# Patient Record
Sex: Female | Born: 1980 | Race: White | Hispanic: No | Smoking: Never smoker
Health system: Southern US, Community
[De-identification: ages and names within clinical notes are randomized; demographics above are authoritative.]

## PROBLEM LIST (undated history)

## (undated) ENCOUNTER — Inpatient Hospital Stay (HOSPITAL_COMMUNITY): Payer: 59

## (undated) DIAGNOSIS — Z8719 Personal history of other diseases of the digestive system: Secondary | ICD-10-CM

## (undated) DIAGNOSIS — F329 Major depressive disorder, single episode, unspecified: Secondary | ICD-10-CM

## (undated) DIAGNOSIS — G43909 Migraine, unspecified, not intractable, without status migrainosus: Secondary | ICD-10-CM

## (undated) DIAGNOSIS — M797 Fibromyalgia: Secondary | ICD-10-CM

## (undated) DIAGNOSIS — M549 Dorsalgia, unspecified: Secondary | ICD-10-CM

## (undated) DIAGNOSIS — F32A Depression, unspecified: Secondary | ICD-10-CM

## (undated) DIAGNOSIS — K219 Gastro-esophageal reflux disease without esophagitis: Secondary | ICD-10-CM

## (undated) DIAGNOSIS — F419 Anxiety disorder, unspecified: Secondary | ICD-10-CM

## (undated) HISTORY — DX: Depression, unspecified: F32.A

## (undated) HISTORY — DX: Anxiety disorder, unspecified: F41.9

## (undated) HISTORY — DX: Migraine, unspecified, not intractable, without status migrainosus: G43.909

## (undated) HISTORY — DX: Dorsalgia, unspecified: M54.9

## (undated) HISTORY — DX: Major depressive disorder, single episode, unspecified: F32.9

## (undated) HISTORY — DX: Gastro-esophageal reflux disease without esophagitis: K21.9

## (undated) HISTORY — DX: Fibromyalgia: M79.7

## (undated) HISTORY — DX: Personal history of other diseases of the digestive system: Z87.19

---

## 2004-12-20 HISTORY — PX: CHOLECYSTECTOMY: SHX55

## 2006-11-27 ENCOUNTER — Emergency Department (HOSPITAL_COMMUNITY): Admission: EM | Admit: 2006-11-27 | Discharge: 2006-11-27 | Payer: Self-pay | Admitting: Emergency Medicine

## 2007-08-18 ENCOUNTER — Ambulatory Visit (HOSPITAL_COMMUNITY): Admission: RE | Admit: 2007-08-18 | Discharge: 2007-08-18 | Payer: Self-pay | Admitting: Family Medicine

## 2011-11-18 ENCOUNTER — Other Ambulatory Visit: Payer: Self-pay | Admitting: Chiropractic Medicine

## 2011-11-18 DIAGNOSIS — M545 Low back pain, unspecified: Secondary | ICD-10-CM

## 2011-11-22 ENCOUNTER — Ambulatory Visit
Admission: RE | Admit: 2011-11-22 | Discharge: 2011-11-22 | Disposition: A | Discharge status: Home / Self Care | Payer: 59 | Source: Ambulatory Visit | Attending: Chiropractic Medicine | Admitting: Chiropractic Medicine

## 2011-11-22 DIAGNOSIS — M545 Low back pain, unspecified: Secondary | ICD-10-CM

## 2012-06-17 ENCOUNTER — Other Ambulatory Visit: Payer: Self-pay

## 2012-06-17 MED ORDER — TOPIRAMATE 100 MG PO TABS: 100.0000 mg | ORAL_TABLET | ORAL | Status: DC

## 2012-06-17 MED ORDER — ALMOTRIPTAN MALATE 12.5 MG PO TABS: 12.5000 mg | ORAL_TABLET | ORAL | Status: DC | PRN

## 2012-06-17 MED ORDER — PROMETHAZINE HCL 25 MG PO TABS: 25.0000 mg | ORAL_TABLET | ORAL | Status: DC | PRN

## 2012-06-17 MED ORDER — NAPROXEN 500 MG PO TABS: 500.0000 mg | ORAL_TABLET | ORAL | Status: DC | PRN

## 2012-07-13 ENCOUNTER — Ambulatory Visit: Payer: Self-pay | Admitting: Neurology

## 2012-07-15 ENCOUNTER — Other Ambulatory Visit: Payer: Self-pay

## 2012-07-15 MED ORDER — ALMOTRIPTAN MALATE 12.5 MG PO TABS: 12.5000 mg | ORAL_TABLET | ORAL | Status: DC | PRN

## 2012-07-27 ENCOUNTER — Encounter: Payer: Self-pay | Admitting: Nurse Practitioner

## 2012-07-27 ENCOUNTER — Ambulatory Visit (INDEPENDENT_AMBULATORY_CARE_PROVIDER_SITE_OTHER): Payer: 59 | Admitting: Nurse Practitioner

## 2012-07-27 VITALS — BP 115/78 | HR 79 | Ht 67.0 in | Wt 188.0 lb

## 2012-07-27 DIAGNOSIS — F3289 Other specified depressive episodes: Secondary | ICD-10-CM

## 2012-07-27 DIAGNOSIS — F329 Major depressive disorder, single episode, unspecified: Secondary | ICD-10-CM

## 2012-07-27 DIAGNOSIS — G43009 Migraine without aura, not intractable, without status migrainosus: Secondary | ICD-10-CM | POA: Insufficient documentation

## 2012-07-27 DIAGNOSIS — F32A Depression, unspecified: Secondary | ICD-10-CM | POA: Insufficient documentation

## 2012-07-27 DIAGNOSIS — F411 Generalized anxiety disorder: Secondary | ICD-10-CM

## 2012-07-27 MED ORDER — TOPIRAMATE 100 MG PO TABS: 100.0000 mg | ORAL_TABLET | Freq: Two times a day (BID) | ORAL | Status: DC

## 2012-07-27 MED ORDER — ALMOTRIPTAN MALATE 12.5 MG PO TABS: 12.5000 mg | ORAL_TABLET | ORAL | Status: DC | PRN

## 2012-07-27 MED ORDER — NAPROXEN 500 MG PO TABS: 500.0000 mg | ORAL_TABLET | ORAL | Status: DC | PRN

## 2012-07-27 MED ORDER — MAGNESIUM OXIDE 400 MG PO TABS: 400.0000 mg | ORAL_TABLET | Freq: Two times a day (BID) | ORAL | Status: DC

## 2012-07-27 MED ORDER — PROMETHAZINE HCL 25 MG PO TABS: 25.0000 mg | ORAL_TABLET | ORAL | Status: DC | PRN

## 2012-07-27 MED ORDER — RIBOFLAVIN 100 MG PO TABS: 1.0000 | ORAL_TABLET | Freq: Two times a day (BID) | ORAL | Status: DC

## 2012-07-27 NOTE — Progress Notes (Signed)
[facility]  PATIENT: Alexandra Warren DOB: [birth year]   HISTORY FROM: patient, chart REASON FOR VISIT: routine follow up for Migraines   HISTORICAL  CHIEF COMPLAINT:  Chief Complaint  Patient presents with  . Headache    Rv in room 9    HISTORY OF PRESENT ILLNESS: [name] is a 32 year-old left-handed Caucasian female referred by her primary care physician for evaluation of migraine. She has history of migraines since 2008, approximately age 74 or 74, typical migraine is right parietal retro-orbital area with severe achy pain, sometimes was associated with light sensitivity and noise sensitivity, lasting a couple hours or until able to go to sleep. Responded very well to Axert. Triggers for her migraines are stress, hormonal change, weather change, and she has increased migraines in the summer, she has 20-30 migraines days each month, responding to Axert 75% of the time. A quarter of the time even though she was taking repeated those of Axert, she would suffer prolonged severe protracted migraine that can last a couple of days. She has tried Imitrex in the past which caused neck tightness. She has been taking Topamax 100 mg every morning, which has been very helpful, previously tried beta blockers and amitriptyline without help. She also has a history of depression and generalized anxiety taking Cymbalta 120 mg every day. Her father had history of migraine.  She was under the care of local neurologist Dr. Lalla Brothers who since has left the practice. MRI of the brain was normal 2009.  UPDATE 07/27/2012: Patient returns for followup visit since last visit 06/11/2011. She reports that Topamax has helped the frequency of her headaches with no adverse side effects. She still takes Axert 1 or 2 doses which usually relieves her headache within one to 2 hours, and occasionally Aleve.  She is currently also taking Celebrex for low back pain. 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 Migraine sometimes occur with nausea, no vomiting. Patient states there's no aura or vision changes. She states that she is now averaging 6-7 migraines per month, and is happy with treatment but would agree to try something else if it would help have even less headaches.  She states that anxiety has always been a large problem for her which leads to depression.  She sees Dr. Valinda Hoar on a regular basis, and has been treated with Zoloft which caused restless legs, and now presently Cymbalta.  REVIEW OF SYSTEMS: Full 14 system review of systems performed and notable only for:  Constitutional: N/A  Cardiovascular: N/A  Ear/Nose/Throat: N/A  Skin: N/A  Eyes: N/A  Respiratory: N/A  Gastroitestinal: N/A  Hematology/Lymphatic: N/A  Endocrine: N/A Musculoskeletal: Joint pain, aching muscles  Allergy/Immunology: N/A  Neurological: Headache Psychiatric: Depression, anxiety, too much sleep, not enough sleep   ALLERGIES: No Known Allergies  HOME MEDICATIONS: Outpatient Prescriptions Prior to Visit  Medication Sig Dispense Refill  . naproxen (NAPROSYN) 500 MG tablet Take 1 tablet (500 mg total) by mouth as needed (for migraine).  30 tablet  0  . promethazine (PHENERGAN) 25 MG tablet Take 1 tablet (25 mg total) by mouth as needed for nausea.  30 tablet  0  . almotriptan (AXERT) 12.5 MG tablet Take 1 tablet (12.5 mg total) by mouth as needed for migraine.  8 tablet  0  . topiramate (TOPAMAX) 100 MG tablet Take 1 tablet (100  mg total) by mouth every morning.  30 tablet  0   . CELEBREX 200 MG capsule    Sig: Take 1 capsule by mouth daily.  Marland Kitchen LORazepam (ATIVAN) 1 MG tablet    Sig: Take 1 tablet by mouth daily.  . DULoxetine (CYMBALTA) 60 MG capsule    Sig: Take 60 mg by mouth 2 (two) times daily.  Marland Kitchen zolpidem (AMBIEN) 10 MG tablet    Sig: Take 10 mg by mouth at bedtime as needed  for sleep.  Marland Kitchen omeprazole (PRILOSEC) 10 MG capsule    Sig: Take 10 mg by mouth daily.  . traMADol (ULTRAM) 50 MG tablet    Sig: Take 50 mg by mouth 4 (four) times daily as needed for pain.   PAST MEDICAL HISTORY: Past Medical History  Diagnosis Date  . Migraine   . Anxiety and depression   . Back pain   . Acid reflux     PAST SURGICAL HISTORY: Past Surgical History  Procedure Laterality Date  . Cholecystectomy  12/2004    FAMILY HISTORY: History reviewed. No pertinent family history.  SOCIAL HISTORY: History   Social History  . Marital Status: Married    Spouse Name: N/A    Number of Children: N/A  . Years of Education: N/A   Occupational History  .  [employer]   Social History Main Topics  . Smoking status: Never Smoker   . Smokeless tobacco: Never Used  . Alcohol Use: No  . Drug Use: No  . Sexually Active: Not on file   Other Topics Concern  . Not on file   Social History Narrative   Patient lives at home with her spouse.    Caffeine Use:      PHYSICAL EXAM  Filed Vitals:   07/27/12 1043  BP: 115/78  Pulse: 79  Height: 5\' 7"  (1.702 m)  Weight: 188 lb (85.276 kg)   Body mass index is 29.44 kg/(m^2).  Generalized: In no acute distress, anxious  Neck: Supple, no carotid bruits   Cardiac: Regular rate rhythm, no murmur   Pulmonary: Clear to auscultation bilaterally   Musculoskeletal: No deformity   Neurological examination   Mentation: Alert oriented to time, place, history taking, language fluent, and causual conversation  Cranial nerve II-XII: Pupils were equal round reactive to light extraocular movements were full, visual field were full on confrontational test. facial sensation and strength were normal. hearing was intact to finger rubbing bilaterally. Uvula tongue midline. head turning and shoulder shrug and were normal and symmetric.Tongue protrusion into cheek strength was normal. MOTOR: normal bulk and tone, full strength in the  BUE, BLE, fine finger movements normal, no pronator drift SENSORY: normal and symmetric to light touch, pinprick, temperature, vibration and proprioception COORDINATION: finger-nose-finger, heel-to-shin bilaterally, there was no truncal ataxia REFLEXES: Brachioradialis 2/2, biceps 2/2, triceps 2/2, patellar 2/2, Achilles 2/2, plantar responses were flexor bilaterally. GAIT/STATION: Rising up from seated position without assistance, normal stance, without trunk ataxia, moderate stride, good arm swing, smooth turning, able to perform tiptoe, and heel walking without difficulty.   DIAGNOSTIC DATA (LABS, IMAGING, TESTING) - I reviewed patient records, labs, notes, testing and imaging myself where available.   ASSESSMENT AND PLAN  31 y.o. year old female  has a past medical history of Migraine; Anxiety and depression; Back pain; and Acid reflux. here with migraine without aura.  PLAN: 1. Increase Topamax 100mg  to BID. #60, Refills 11. 2. Start Riboflavin 100 mg BID and Magnesium Oxide 400 mg  BID. 3. Continue Axert 12.5 mg for acute headaches. #15, Refill 11 4. May continue Naproxen 500mg  , Phenergen 25 mg as needed for prolonged severe migraine headaches. 5. Please keep a diary of your headaches the month before you return and bring to next visit.  Document  each occurrence on the calendar with notation of : #1 any prodrome ( any non headache symptom such as marked fatigue,visual changes, ,etc ) which precedes actual headache ; #2) severity on 1-10 scale; #3) any triggers ( food/ drink,enviromenntal or weather changes ,physical or emotional stress) in 8-12 hour period prior to the headache; & #4) response to any medications or other intervention.  (Handout given). 6. Follow up in 6 months, sooner if needed.   Meds ordered this encounter  Medications  . topiramate (TOPAMAX) 100 MG tablet    Sig: Take 1 tablet (100 mg total) by mouth 2 (two) times daily.    Dispense:  60 tablet    Refill:  11     Order Specific Question:  Supervising Provider    Answer:  Levert Feinstein [[id number]]  . almotriptan (AXERT) 12.5 MG tablet    Sig: Take 1 tablet (12.5 mg total) by mouth as needed for migraine.    Dispense:  15 tablet    Refill:  11    Order Specific Question:  Supervising Provider    Answer:  Levert Feinstein [[id number]]  . Riboflavin 100 MG TABS    Sig: Take 1 tablet (100 mg total) by mouth 2 (two) times daily.    Refill:  0    Order Specific Question:  Supervising Provider    Answer:  Levert Feinstein [[id number]]  . magnesium oxide (MAG-OX) 400 MG tablet    Sig: Take 1 tablet (400 mg total) by mouth 2 (two) times daily.    Order Specific Question:  Supervising Provider    Answer:  Levert Feinstein [[id number]]     LYNN LAM NP-C 07/27/2012, 11:39 AM  [facility] [address], [address] [place], [place] [address] [phone]

## 2012-07-27 NOTE — Patient Instructions (Signed)
Increase Topamax to 100mg  twice a day.  Continue Axert for acute headache.  May take Naproxen for headache, try not to take with Celebrex.  Start Riboflavin (otc) 100mg  twice a day and  Magnesium Oxide 400mg  twice a day.  Return in 6 months.

## 2013-01-28 ENCOUNTER — Encounter: Payer: Self-pay | Admitting: Nurse Practitioner

## 2013-01-28 ENCOUNTER — Encounter (INDEPENDENT_AMBULATORY_CARE_PROVIDER_SITE_OTHER): Payer: Self-pay

## 2013-01-28 ENCOUNTER — Ambulatory Visit (INDEPENDENT_AMBULATORY_CARE_PROVIDER_SITE_OTHER): Payer: 59 | Admitting: Nurse Practitioner

## 2013-01-28 VITALS — BP 117/82 | HR 97 | Temp 98.7°F | Ht 66.0 in | Wt 196.5 lb

## 2013-01-28 DIAGNOSIS — F411 Generalized anxiety disorder: Secondary | ICD-10-CM

## 2013-01-28 DIAGNOSIS — G43009 Migraine without aura, not intractable, without status migrainosus: Secondary | ICD-10-CM

## 2013-01-28 MED ORDER — ONDANSETRON HCL 4 MG PO TABS: 4.0000 mg | ORAL_TABLET | Freq: Three times a day (TID) | ORAL | Status: DC | PRN

## 2013-01-28 NOTE — Progress Notes (Signed)
PATIENT: CATISHA CURCURU DOB: 1980-04-15   REASON FOR VISIT: follow up HISTORY FROM: patient  HISTORY OF PRESENT ILLNESS: Mrs. Meryssa Brander is a 33 year-old left-handed Caucasian female referred by her primary care physician for evaluation of migraine. She has history of migraines since 2008, approximately age 5 or 8, typical migraine is right parietal retro-orbital area with severe achy pain, sometimes was associated with light sensitivity and noise sensitivity, lasting a couple hours or until able to go to sleep. Responded very well to Axert. Triggers for her migraines are stress, hormonal change, weather change, and she has increased migraines in the summer, she has 20-30 migraines days each month, responding to Axert 75% of the time. A quarter of the time even though she was taking repeated those of Axert, she would suffer prolonged severe protracted migraine that can last a couple of days. She has tried Imitrex in the past which caused neck tightness. She has been taking Topamax 100 mg every morning, which has been very helpful, previously tried beta blockers and amitriptyline without help. She also has a history of depression and generalized anxiety taking Cymbalta 120 mg every day. Her father had history of migraine. She was under the care of local neurologist Dr. Silvio Pate who since has left the practice. MRI of the brain was normal 2009.   UPDATE 07/27/2012 (LL): Patient returns for followup visit since last visit 06/11/2011. She reports that Topamax has helped the frequency of her headaches with no adverse side effects. She still takes Axert 1 or 2 doses which usually relieves her headache within one to 2 hours, and occasionally Aleve. She is currently also taking Celebrex for low back pain. 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 Migraine sometimes occur with nausea, no vomiting.  Patient states there's no aura or vision changes. She states that she is now averaging 6-7 migraines per month, and is happy with treatment but would agree to try something else if it would help have even less headaches. She states that anxiety has always been a large problem for her which leads to depression. She sees Dr. Gala Murdoch on a regular basis, and has been treated with Zoloft which caused restless legs, and now presently Cymbalta.   UPDATE 01/28/13 (LL):  Mrs. Laurance Flatten returns for revisit.  She reports headaches were more frequent around the holidays and that she averages 10 Migraine days per month even with Topamax and Riboflavin/Magnesium.  Axert is effective.  She is happy with current treatment, except would like something other than Phenergan for nausea because it gives her restless legs.  REVIEW OF SYSTEMS: Full 14 system review of systems performed and notable only for: Insomnia, Back pain, Headache, Depression, Anxiety.  ALLERGIES: No Known Allergies  HOME MEDICATIONS: Outpatient Prescriptions Prior to Visit  Medication Sig Dispense Refill  . almotriptan (AXERT) 12.5 MG tablet Take 1 tablet (12.5 mg total) by mouth as needed for migraine.  15 tablet  11  . CELEBREX 200 MG capsule Take 1 capsule by mouth daily.      . DULoxetine (CYMBALTA) 60 MG capsule Take 60 mg by mouth 2 (two) times daily.      Marland Kitchen LORazepam (ATIVAN) 1 MG tablet Take 1 tablet by mouth daily.      . magnesium oxide (MAG-OX) 400 MG tablet Take 1 tablet (400 mg total) by mouth 2 (two) times daily.      Marland Kitchen  naproxen (NAPROSYN) 500 MG tablet Take 1 tablet (500 mg total) by mouth as needed (for migraine).  30 tablet  3  . omeprazole (PRILOSEC) 10 MG capsule Take 10 mg by mouth daily.      . Riboflavin 100 MG TABS Take 1 tablet (100 mg total) by mouth 2 (two) times daily.    0  . topiramate (TOPAMAX) 100 MG tablet Take 1 tablet (100 mg total) by mouth 2 (two) times daily.  60 tablet  11  . traMADol (ULTRAM) 50 MG tablet  Take 50 mg by mouth 4 (four) times daily as needed for pain.      Marland Kitchen zolpidem (AMBIEN) 10 MG tablet Take 10 mg by mouth at bedtime as needed for sleep.      . promethazine (PHENERGAN) 25 MG tablet Take 1 tablet (25 mg total) by mouth as needed for nausea.  30 tablet  0   No facility-administered medications prior to visit.    PAST MEDICAL HISTORY: Past Medical History  Diagnosis Date  . Migraine   . Anxiety and depression     Dr. Gala Murdoch, MD  . Back pain   . Acid reflux     PAST SURGICAL HISTORY: Past Surgical History  Procedure Laterality Date  . Cholecystectomy  12/2004    FAMILY HISTORY: History reviewed. No pertinent family history.  SOCIAL HISTORY: History   Social History  . Marital Status: Married    Spouse Name: Corene Cornea    Number of Children: 0  . Years of Education: MS   Occupational History  .  [employer]  . [employer]   Social History Main Topics  . Smoking status: Never Smoker   . Smokeless tobacco: Never Used  . Alcohol Use: No  . Drug Use: No  . Sexual Activity: Not on file   Other Topics Concern  . Not on file   Social History Narrative   Patient lives at home with her spouse.    Caffeine Use: 1-2 sodas daily   PHYSICAL EXAM  Filed Vitals:   01/28/13 1521  BP: 117/82  Pulse: 97  Temp: 98.7 F (37.1 C)  TempSrc: Oral  Height: 5\' 6"  (1.676 m)  Weight: 196 lb 8 oz (89.132 kg)   Body mass index is 31.73 kg/(m^2).  Generalized: Well developed, in no acute distress  Head: normocephalic and atraumatic. Oropharynx benign  Neck: Supple, no carotid bruits  Cardiac: Regular rate rhythm, no murmur  Musculoskeletal: No deformity   Neurological examination  Mentation: Alert oriented to time, place, history taking. Follows all commands speech and language fluent Cranial nerve II-XII: Fundoscopic exam reveals sharp disc margins.Pupils were equal round reactive to light extraocular movements were  full, visual field were full on confrontational test. Facial sensation and strength were normal. hearing was intact to finger rubbing bilaterally. Uvula tongue midline. head turning and shoulder shrug and were normal and symmetric.Tongue protrusion into cheek strength was normal. Motor: normal bulk and tone, full strength in the BUE, BLE, fine finger movements normal, no pronator drift. No focal weakness Sensory: normal and symmetric to light touch, pinprick, and  vibration  Coordination: finger-nose-finger, heel-to-shin bilaterally, no dysmetria Reflexes:  Deep tendon reflexes in the upper and lower extremities are present and symmetric.  Gait and Station: Rising up from seated position without assistance, normal stance, without trunk ataxia, moderate stride, good arm swing, smooth turning, able to perform tiptoe, and heel walking without difficulty.    ASSESSMENT AND PLAN  33 y.o. year old female has a past medical history of Migraine; Anxiety and depression; Back pain; and Acid reflux. here with migraine without aura.  Her migraines are slightly improved, still having at least 10 Migraine days per month.  PLAN:  1. Continue Topamax and Riboflavin, Magnesium for Migraine prevention.  Stressed contraception while on Topamax. 2. Continue Axrt as needed for acute Migraine.  Try samples of Relpax and Cambia for acute Migraine.  If you would like a prescription for those, please call. 3. Zofran q 8 hours as needed for nausea. 4. Please keep a diary of your headaches the month before you return and bring to next visit. Document each occurrence on the calendar with notation of : #1 any prodrome ( any non headache symptom such as marked fatigue,visual changes, ,etc ) which precedes actual headache ; #2) severity on 1-10 scale; #3) any triggers ( food/ drink,enviromenntal or weather changes ,physical or emotional stress) in 8-12 hour period prior to the headache; & #4) response to any medications or other  intervention. (Handout given).  5. Follow up in 6 months, sooner if needed, to see Dr. Krista Blue next time.  Meds ordered this encounter  Medications  . ondansetron (ZOFRAN) 4 MG tablet    Sig: Take 1 tablet (4 mg total) by mouth every 8 (eight) hours as needed for nausea or vomiting.    Dispense:  20 tablet    Refill:  5    Order Specific Question:  Supervising Provider    Answer:  Marcial Pacas [[id number]]   Return in about 6 months (around 07/28/2013).  Philmore Pali, MSN, NP-C 01/28/2013, 4:03 PM [facility] [address], [place], [place] [address] [phone]  Note: This document was prepared with digital dictation and possible smart phrase technology. Any transcriptional errors that result from this process are unintentional.

## 2013-01-28 NOTE — Patient Instructions (Addendum)
Continue Topamax and Riboflavin, Magnesium for Migraine prevention.  Continue Axert as needed for acute Migraine.  Try samples of Relpax and Cambia for acute Migraine.  If you would like a prescription for those, please call.  Zofran 4 mg was sent to the pharmacy for nausea.  Please keep a diary of your headaches the month before you return and bring to next visit. Document each occurrence on the calendar with notation of : #1 any prodrome ( any non headache symptom such as marked fatigue,visual changes, ,etc ) which precedes actual headache ; #2) severity on 1-10 scale; #3) any triggers ( food/ drink,enviromenntal or weather changes ,physical or emotional stress) in 8-12 hour period prior to the headache; & #4) response to any medications or other intervention. (Handout given).  6. Follow up in 6 months, sooner if needed with Dr. Krista Blue.

## 2013-07-28 ENCOUNTER — Ambulatory Visit: Payer: 59 | Admitting: Neurology

## 2013-08-02 ENCOUNTER — Encounter: Payer: Self-pay | Admitting: Neurology

## 2013-08-02 ENCOUNTER — Ambulatory Visit (INDEPENDENT_AMBULATORY_CARE_PROVIDER_SITE_OTHER): Payer: 59 | Admitting: Neurology

## 2013-08-02 VITALS — BP 115/78 | HR 128 | Ht 65.0 in | Wt 192.0 lb

## 2013-08-02 DIAGNOSIS — G4700 Insomnia, unspecified: Secondary | ICD-10-CM

## 2013-08-02 MED ORDER — TOPIRAMATE 100 MG PO TABS: 100.0000 mg | ORAL_TABLET | Freq: Two times a day (BID) | ORAL | Status: DC

## 2013-08-02 MED ORDER — ALMOTRIPTAN MALATE 12.5 MG PO TABS: 12.5000 mg | ORAL_TABLET | ORAL | Status: DC | PRN

## 2013-08-02 NOTE — Progress Notes (Signed)
PATIENT: Alexandra Warren DOB: [birth year]  HISTORY OF PRESENT ILLNESS: Alexandra Warren is a 33 year-old left-handed Caucasian female referred by her primary care physician PA Delman Cheadle for evaluation of migraine.   She has history of migraines since 2008, approximately age 89 or 60, typical migraine is right parietal retro-orbital area with severe achy pain, sometimes was associated with light sensitivity and noise sensitivity, lasting a couple hours or until able to go to sleep. Responded very well to Axert. Triggers for her migraines are stress, hormonal change, weather change.  She can have up to 20-30 migraines days each month, responding to Axert 75% of the time. A quarter of the time even though she was taking repeated those of Axert, she would suffer prolonged severe protracted migraine that can last a couple of days. She has tried Imitrex in the past which caused neck tightness.   She has been taking Topamax 100 mg every morning, which has been very helpful, previously tried beta blockers and amitriptyline without help.   She also has a history of depression and generalized anxiety taking Cymbalta 120 mg every day. Her father had history of migraine. She was previous under the care of local neurologist Dr. Silvio Pate who since has left the practice.   MRI of the brain was normal 2009.    She is now taking Topamax 100mg  bid and Riboflavin/Magnesium.  Axert is effective   UPDATE July 14th 2015: She only had 2 migraines in past 1 month, now she is less stressful, while she is on summer medication, Axert continues to help, she also complains of excessive fatigue, sleepiness during the daytime, ESS score is 14, FSS is 59,  She denies snoring, has lost few pounds. But she has excessive daytime sleepiness, and fatigue.  REVIEW OF SYSTEMS: Full 14 system review of systems performed and notable only for: Insomnia, Back pain, Headache, Depression, Anxiety.  ALLERGIES: No Known  Allergies  HOME MEDICATIONS: Outpatient Prescriptions Prior to Visit  Medication Sig Dispense Refill  . almotriptan (AXERT) 12.5 MG tablet Take 1 tablet (12.5 mg total) by mouth as needed for migraine.  15 tablet  11  . CELEBREX 200 MG capsule Take 1 capsule by mouth daily.      . DULoxetine (CYMBALTA) 60 MG capsule Take 60 mg by mouth 2 (two) times daily.      Marland Kitchen LORazepam (ATIVAN) 1 MG tablet Take 1 tablet by mouth daily.      . magnesium oxide (MAG-OX) 400 MG tablet Take 1 tablet (400 mg total) by mouth 2 (two) times daily.      . naproxen (NAPROSYN) 500 MG tablet Take 1 tablet (500 mg total) by mouth as needed (for migraine).  30 tablet  3  . omeprazole (PRILOSEC) 10 MG capsule Take 10 mg by mouth daily.      . ondansetron (ZOFRAN) 4 MG tablet Take 1 tablet (4 mg total) by mouth every 8 (eight) hours as needed for nausea or vomiting.  20 tablet  5  . Riboflavin 100 MG TABS Take 1 tablet (100 mg total) by mouth 2 (two) times daily.    0  . topiramate (TOPAMAX) 100 MG tablet Take 1 tablet (100 mg total) by mouth 2 (two) times daily.  60 tablet  11  . traMADol (ULTRAM) 50 MG tablet Take 50 mg by mouth 4 (four) times daily as needed for pain.      Marland Kitchen zolpidem (AMBIEN) 10 MG tablet Take 5 mg by mouth  at bedtime as needed for sleep.        No facility-administered medications prior to visit.    PAST MEDICAL HISTORY: Past Medical History  Diagnosis Date  . Migraine   . Anxiety and depression     Dr. Gala Murdoch, MD  . Back pain   . Acid reflux     PAST SURGICAL HISTORY: Past Surgical History  Procedure Laterality Date  . Cholecystectomy  12/2004    FAMILY HISTORY: History reviewed. No pertinent family history.  SOCIAL HISTORY: History   Social History  . Marital Status: Married    Spouse Name: Corene Cornea    Number of Children: 0  . Years of Education: MS   Occupational History  .  [employer]  . [employer]   Social History Main  Topics  . Smoking status: Never Smoker   . Smokeless tobacco: Never Used  . Alcohol Use: No  . Drug Use: No  . Sexual Activity: Not on file   Other Topics Concern  . Not on file   Social History Narrative   Patient lives at home with her spouse.    Caffeine Use: 1-2 sodas daily   PHYSICAL EXAM  Filed Vitals:   08/02/13 1314  BP: 115/78  Pulse: 128  Height: 5\' 5"  (1.651 m)  Weight: 192 lb (87.091 kg)   Body mass index is 31.95 kg/(m^2).  Generalized: Well developed, in no acute distress  Head: normocephalic and atraumatic. Oropharynx benign  Neck: Supple, no carotid bruits  Cardiac: Regular rate rhythm, no murmur  Musculoskeletal: No deformity   Neurological examination  Mentation: Alert oriented to time, place, history taking. Follows all commands speech and language fluent Cranial nerve II-XII: Fundoscopic exam reveals sharp disc margins.Pupils were equal round reactive to light extraocular movements were full, visual field were full on confrontational test. Facial sensation and strength were normal. hearing was intact to finger rubbing bilaterally. Uvula tongue midline. head turning and shoulder shrug and were normal and symmetric.Tongue protrusion into cheek strength was normal. Motor: normal bulk and tone, full strength in the BUE, BLE, fine finger movements normal, no pronator drift. No focal weakness Sensory: normal and symmetric to light touch, pinprick, and  vibration  Coordination: finger-nose-finger, heel-to-shin bilaterally, no dysmetria Reflexes:  Deep tendon reflexes in the upper and lower extremities are present and symmetric.  Gait and Station: Rising up from seated position without assistance, normal stance, without trunk ataxia, moderate stride, good arm swing, smooth turning, able to perform tiptoe, and heel walking without difficulty.    ASSESSMENT AND PLAN 33 y.o. year old female has a past medical history of Migraine; Anxiety and depression; Back pain;  and Acid reflux. here with migraine without aura.  Her migraines are slightly improved, still having at least 10 Migraine days per month.  PLAN:  1. Continue Topamax and Riboflavin, Magnesium for Migraine prevention.   2. Axert as needed. 3. Sleep refer  Marcial Pacas, M.D   [facility] [address], [place], [place] [address] [phone]

## 2013-08-03 ENCOUNTER — Telehealth: Payer: Self-pay | Admitting: Neurology

## 2013-08-03 DIAGNOSIS — E669 Obesity, unspecified: Secondary | ICD-10-CM

## 2013-08-03 DIAGNOSIS — R519 Headache, unspecified: Secondary | ICD-10-CM

## 2013-08-03 DIAGNOSIS — R400 Somnolence: Secondary | ICD-10-CM

## 2013-08-03 DIAGNOSIS — R51 Headache: Secondary | ICD-10-CM

## 2013-08-03 NOTE — Telephone Encounter (Signed)
.   Dr. Marcial Pacas is referring Alexandra Warren, 33 y.o. y/o female, for an attended sleep study.  Wt: 192 lbs Ht: 65 in BMI: 31.95  Diagnoses: Migraine Excessive Daytime Sleepiness Fatigue Insomnia Obesity  Medication List: Current Outpatient Prescriptions  Medication Sig Dispense Refill  . almotriptan (AXERT) 12.5 MG tablet Take 1 tablet (12.5 mg total) by mouth as needed for migraine.  15 tablet  11  . CELEBREX 200 MG capsule Take 1 capsule by mouth daily.      . DULoxetine (CYMBALTA) 60 MG capsule Take 60 mg by mouth 2 (two) times daily.      Marland Kitchen gabapentin (NEURONTIN) 300 MG capsule Take 300 capsules by mouth 2 (two) times daily.      Marland Kitchen LORazepam (ATIVAN) 1 MG tablet Take 1 tablet by mouth daily.      . magnesium oxide (MAG-OX) 400 MG tablet Take 1 tablet (400 mg total) by mouth 2 (two) times daily.      . naproxen (NAPROSYN) 500 MG tablet Take 1 tablet (500 mg total) by mouth as needed (for migraine).  30 tablet  3  . omeprazole (PRILOSEC) 10 MG capsule Take 10 mg by mouth daily.      . ondansetron (ZOFRAN) 4 MG tablet Take 1 tablet (4 mg total) by mouth every 8 (eight) hours as needed for nausea or vomiting.  20 tablet  5  . Riboflavin 100 MG TABS Take 1 tablet (100 mg total) by mouth 2 (two) times daily.    0  . topiramate (TOPAMAX) 100 MG tablet Take 1 tablet (100 mg total) by mouth 2 (two) times daily.  60 tablet  11  . traMADol (ULTRAM) 50 MG tablet Take 50 mg by mouth 4 (four) times daily as needed for pain.      . traZODone (DESYREL) 50 MG tablet Take 50 mg by mouth daily.      Marland Kitchen zolpidem (AMBIEN) 10 MG tablet Take 5 mg by mouth at bedtime as needed for sleep.        No current facility-administered medications for this visit.    This patient presents to Dr. Marcial Pacas in follow up for Migraines.  Pt with hx of headache and migraine, complains of excessive daytime sleepiness - endorsing Epworth at 14.  Pt also notes excessive fatigue.  Please review for an attended study to  rule out osa.  Insurance:  [employer] - prior approval pending - 100% coverage

## 2013-08-08 NOTE — Telephone Encounter (Signed)
Sleep study request review: This patient has an underlying medical history of migraine, obesity, and is referred by Dr. Krista Blue for an attended sleep study due to a report of daytime somnolence with an Epworth sleepiness score of 14, recurrent headaches. I will order a split-night sleep study and see the patient in sleep medicine consultation afterwards. Star Age, MD, PhD [facility])

## 2013-08-17 ENCOUNTER — Telehealth: Payer: Self-pay | Admitting: Nurse Practitioner

## 2013-08-17 NOTE — Telephone Encounter (Signed)
Pt called to cancel her sleep study that was scheduled for 8/31.  Says that she does not feel that she has sleep apnea and does not wish to proceed.  Advised the pt that notification would be sent to the referring provider.

## 2014-08-03 ENCOUNTER — Encounter: Payer: Self-pay | Admitting: Nurse Practitioner

## 2014-08-03 ENCOUNTER — Ambulatory Visit (INDEPENDENT_AMBULATORY_CARE_PROVIDER_SITE_OTHER): Payer: Commercial Managed Care - HMO | Admitting: Nurse Practitioner

## 2014-08-03 VITALS — BP 119/89 | HR 105 | Ht 66.0 in | Wt 192.5 lb

## 2014-08-03 DIAGNOSIS — F411 Generalized anxiety disorder: Secondary | ICD-10-CM

## 2014-08-03 DIAGNOSIS — G43009 Migraine without aura, not intractable, without status migrainosus: Secondary | ICD-10-CM

## 2014-08-03 DIAGNOSIS — F329 Major depressive disorder, single episode, unspecified: Secondary | ICD-10-CM

## 2014-08-03 DIAGNOSIS — F32A Depression, unspecified: Secondary | ICD-10-CM

## 2014-08-03 DIAGNOSIS — G4700 Insomnia, unspecified: Secondary | ICD-10-CM | POA: Diagnosis not present

## 2014-08-03 MED ORDER — TOPIRAMATE 100 MG PO TABS: 100.0000 mg | ORAL_TABLET | Freq: Two times a day (BID) | ORAL | Status: DC

## 2014-08-03 MED ORDER — ALMOTRIPTAN MALATE 12.5 MG PO TABS: 12.5000 mg | ORAL_TABLET | ORAL | Status: DC | PRN

## 2014-08-03 MED ORDER — ONDANSETRON HCL 4 MG PO TABS: 4.0000 mg | ORAL_TABLET | Freq: Three times a day (TID) | ORAL | Status: DC | PRN

## 2014-08-03 NOTE — Progress Notes (Signed)
[facility]  PATIENT: Alexandra Warren DOB: 1980/09/01   REASON FOR VISIT: Follow-up for migraine, depression, insomnia,  anxiety disorder HISTORY FROM: Patient    HISTORY OF PRESENT ILLNESS:She has history of migraines since 2008, approximately age 34 or 32, typical migraine is right parietal retro-orbital area with severe achy pain, sometimes was associated with light sensitivity and noise sensitivity, lasting a couple hours or until able to go to sleep. Responded very well to Axert. Triggers for her migraines are stress, hormonal change, weather change. She can have up to 20-30 migraines days each month, responding to Axert 75% of the time. A quarter of the time even though she was taking repeated those of Axert, she would suffer prolonged severe protracted migraine that can last a couple of days. She has tried Imitrex in the past which caused neck tightness.  She has been taking Topamax 100 mg every morning, which has been very helpful, previously tried beta blockers and amitriptyline without help.  She also has a history of depression and generalized anxiety taking Cymbalta 120 mg every day. Her father had history of migraine. She was previous under the care of local neurologist Dr. Silvio Pate who since has left the practice.  MRI of the brain was normal 2009. She is now taking Topamax 100mg  bid and Riboflavin/Magnesium. Axert is effective   UPDATE July 14th 2016: Alexandra Warren, 34 year old female returns for follow-up. She has a history of migraine headaches and has had less headaches recently since she started working out at her home. She still says she can have 5-10 headaches, not every month. Some of her headaches are clustered  around her menstrual cycle.  Axert continues to help acutely and for the occasional nausea she takes Zofran. She is currently taking Topamax 200 mg at night. She is also taking riboflavin and magnesium. The only food triggers she is aware of is red  wine. MRI of the brain in the past has been normal. She canceled her sleep study if she did not feel her daytime drowsiness was enough to warrant this. She returns for reevaluation    REVIEW OF SYSTEMS: Full 14 system review of systems performed and notable only for those listed, all others are neg:  Constitutional: neg  Cardiovascular: neg Ear/Nose/Throat: neg  Skin: neg Eyes: neg Respiratory: neg Gastroitestinal: neg  Hematology/Lymphatic: neg  Endocrine: neg Musculoskeletal:neg Allergy/Immunology: neg Neurological: neg Psychiatric: neg Sleep : neg   ALLERGIES: No Known Allergies  HOME MEDICATIONS: Outpatient Prescriptions Prior to Visit  Medication Sig Dispense Refill  . almotriptan (AXERT) 12.5 MG tablet Take 1 tablet (12.5 mg total) by mouth as needed for migraine. 15 tablet 11  . CELEBREX 200 MG capsule Take 1 capsule by mouth daily.    . DULoxetine (CYMBALTA) 60 MG capsule Take 120 mg by mouth daily.     Marland Kitchen gabapentin (NEURONTIN) 300 MG capsule Take 300 capsules by mouth 3 (three) times daily.     Marland Kitchen LORazepam (ATIVAN) 1 MG tablet Take 1 tablet by mouth 2 (two) times daily as needed.     . magnesium oxide (MAG-OX) 400 MG tablet Take 1 tablet (400 mg total) by mouth 2 (two) times daily.    . ondansetron (ZOFRAN) 4 MG tablet Take 1 tablet (4 mg total) by mouth every 8 (eight) hours as needed for nausea or vomiting. 20 tablet 5  . Riboflavin 100 MG TABS Take 1 tablet (100 mg total) by mouth 2 (two) times daily.  0  . topiramate (  TOPAMAX) 100 MG tablet Take 1 tablet (100 mg total) by mouth 2 (two) times daily. 60 tablet 11  . traMADol (ULTRAM) 50 MG tablet Take 50 mg by mouth 4 (four) times daily as needed for pain.    Marland Kitchen zolpidem (AMBIEN) 10 MG tablet Take 10 mg by mouth at bedtime as needed for sleep.     . naproxen (NAPROSYN) 500 MG tablet Take 1 tablet (500 mg total) by mouth as needed (for migraine). 30 tablet 3  . omeprazole (PRILOSEC) 10 MG capsule Take 10 mg by mouth  daily.    . traZODone (DESYREL) 50 MG tablet Take 50 mg by mouth daily.     No facility-administered medications prior to visit.    PAST MEDICAL HISTORY: Past Medical History  Diagnosis Date  . Migraine   . Anxiety and depression     Dr. Gala Murdoch, MD  . Back pain   . Acid reflux     PAST SURGICAL HISTORY: Past Surgical History  Procedure Laterality Date  . Cholecystectomy  12/2004    FAMILY HISTORY: History reviewed. No pertinent family history.  SOCIAL HISTORY: History   Social History  . Marital Status: Married    Spouse Name: Corene Cornea  . Number of Children: 0  . Years of Education: MS   Occupational History  .  [employer]  . [employer]   Social History Main Topics  . Smoking status: Never Smoker   . Smokeless tobacco: Never Used  . Alcohol Use: No  . Drug Use: No  . Sexual Activity: Not on file   Other Topics Concern  . Not on file   Social History Narrative   Patient lives at home with her spouse.    Caffeine Use: 1-2 sodas daily     PHYSICAL EXAM  Filed Vitals:   08/03/14 1329  BP: 119/89  Pulse: 105  Height: 5\' 6"  (1.676 m)  Weight: 192 lb 8 oz (87.317 kg)   Body mass index is 31.08 kg/(m^2). Generalized: Well developed, in no acute distress  Head: normocephalic and atraumatic. Oropharynx benign  Neck: Supple, no carotid bruits  Musculoskeletal: No deformity   Neurological examination  Mentation: Alert oriented to time, place, history taking. Follows all commands speech and language fluent Cranial nerve II-XII: Fundoscopic exam reveals sharp disc margins.Pupils were equal round reactive to light extraocular movements were full, visual field were full on confrontational test. Facial sensation and strength were normal. hearing was intact to finger rubbing bilaterally. Uvula tongue midline. head turning and shoulder shrug and were normal and symmetric.Tongue protrusion into cheek strength was  normal. Motor: normal bulk and tone, full strength in the BUE, BLE, fine finger movements normal, no pronator drift. No focal weakness Coordination: finger-nose-finger, heel-to-shin bilaterally, no dysmetria Reflexes: Deep tendon reflexes in the upper and lower extremities are present and symmetric.  Gait and Station: Rising up from seated position without assistance, normal stance, without trunk ataxia, moderate stride, good arm swing, smooth turning, able to perform tiptoe, and heel walking without difficulty.    DIAGNOSTIC DATA (LABS, IMAGING, TESTING) -  ASSESSMENT AND PLAN  34 y.o. year old female  has a past medical history of Migraine; Anxiety and depression; and insomnia here to follow-up.  Continue Axert acutely will refill Continue Zofran as needed for nausea Continue Topamax 100 mg twice daily 12 hours apart (the medication is not extended release) Continue magnesium Continue riboflavin Given a list of migraine triggers I spent additional 15  minutes in total face to face time with the patient more than 50% of which was spent counseling and coordination of care, reviewing test results reviewing medications and discussing and reviewing the diagnosis of migraine and further treatment options. Importance of keeping a diary if headaches worsen to include the time of the headache what you're doing any other specific information that would be useful. Discussed stress relief techniques such as deep breathing muscle relaxation mental relaxation to music. Discussed importance of exercise, regular meals  and sleep. Sleep deprivation can be a migraine trigger Follow-up in 6-8 months Dennie Bible, [facility], [facility], APRN  [facility] [address], [place], [place] [address] [phone]

## 2014-08-03 NOTE — Patient Instructions (Addendum)
Continue Axert acutely will refill Continue Zofran as needed for nausea Continue Topamax 100 mg twice daily 12 hours apart Continue magnesium Continue riboflavin Given a list of migraine triggers Follow-up in 6-8 months

## 2014-08-14 NOTE — Progress Notes (Signed)
I have reviewed and agreed above plan. 

## 2014-08-23 ENCOUNTER — Other Ambulatory Visit (HOSPITAL_COMMUNITY): Payer: Self-pay | Admitting: Family Medicine

## 2014-08-23 DIAGNOSIS — R748 Abnormal levels of other serum enzymes: Secondary | ICD-10-CM

## 2014-08-28 ENCOUNTER — Other Ambulatory Visit (HOSPITAL_COMMUNITY): Payer: Self-pay | Admitting: Family Medicine

## 2014-08-28 ENCOUNTER — Ambulatory Visit (HOSPITAL_COMMUNITY)
Admission: RE | Admit: 2014-08-28 | Discharge: 2014-08-28 | Disposition: A | Discharge status: Home / Self Care | Payer: Commercial Managed Care - HMO | Source: Ambulatory Visit | Attending: Family Medicine | Admitting: Family Medicine

## 2014-08-28 DIAGNOSIS — R748 Abnormal levels of other serum enzymes: Secondary | ICD-10-CM

## 2014-08-28 DIAGNOSIS — Z9049 Acquired absence of other specified parts of digestive tract: Secondary | ICD-10-CM | POA: Insufficient documentation

## 2015-04-03 ENCOUNTER — Ambulatory Visit: Payer: Commercial Managed Care - HMO | Admitting: Nurse Practitioner

## 2015-04-17 ENCOUNTER — Other Ambulatory Visit: Payer: Self-pay | Admitting: Orthopaedic Surgery

## 2015-04-17 DIAGNOSIS — M47897 Other spondylosis, lumbosacral region: Secondary | ICD-10-CM

## 2015-04-23 ENCOUNTER — Ambulatory Visit
Admission: RE | Admit: 2015-04-23 | Discharge: 2015-04-23 | Disposition: A | Discharge status: Home / Self Care | Payer: Commercial Managed Care - HMO | Source: Ambulatory Visit | Attending: Orthopaedic Surgery | Admitting: Orthopaedic Surgery

## 2015-04-23 DIAGNOSIS — M47897 Other spondylosis, lumbosacral region: Secondary | ICD-10-CM

## 2015-08-06 ENCOUNTER — Other Ambulatory Visit: Payer: Self-pay | Admitting: Nurse Practitioner

## 2015-08-20 ENCOUNTER — Other Ambulatory Visit: Payer: Self-pay | Admitting: Nurse Practitioner

## 2015-09-19 ENCOUNTER — Ambulatory Visit (INDEPENDENT_AMBULATORY_CARE_PROVIDER_SITE_OTHER): Payer: Commercial Managed Care - HMO | Admitting: Nurse Practitioner

## 2015-09-19 ENCOUNTER — Encounter: Payer: Self-pay | Admitting: Nurse Practitioner

## 2015-09-19 VITALS — BP 143/88 | HR 113 | Ht 66.0 in

## 2015-09-19 DIAGNOSIS — G43009 Migraine without aura, not intractable, without status migrainosus: Secondary | ICD-10-CM | POA: Diagnosis not present

## 2015-09-19 DIAGNOSIS — F32A Depression, unspecified: Secondary | ICD-10-CM

## 2015-09-19 DIAGNOSIS — G4700 Insomnia, unspecified: Secondary | ICD-10-CM

## 2015-09-19 DIAGNOSIS — F411 Generalized anxiety disorder: Secondary | ICD-10-CM

## 2015-09-19 DIAGNOSIS — F329 Major depressive disorder, single episode, unspecified: Secondary | ICD-10-CM

## 2015-09-19 MED ORDER — ONDANSETRON HCL 4 MG PO TABS
4.0000 mg | ORAL_TABLET | Freq: Three times a day (TID) | ORAL | 5 refills | Status: DC | PRN
Start: 2015-09-19 — End: 2016-10-20

## 2015-09-19 MED ORDER — ALMOTRIPTAN MALATE 12.5 MG PO TABS: 12.5000 mg | ORAL_TABLET | ORAL | 11 refills | Status: DC | PRN

## 2015-09-19 MED ORDER — TOPIRAMATE 100 MG PO TABS: 100.0000 mg | ORAL_TABLET | Freq: Two times a day (BID) | ORAL | 11 refills | Status: DC

## 2015-09-19 NOTE — Progress Notes (Signed)
I have read the note, and I agree with the clinical assessment and plan.  Ardis Lawley KEITH   

## 2015-09-19 NOTE — Patient Instructions (Signed)
Continue Axert acutely will refill Continue Zofran as needed for nausea Continue Topamax 100 mg twice daily  Continue magnesium Continue riboflavin Follow-up yearly and when necessary

## 2015-09-19 NOTE — Progress Notes (Signed)
[facility]  PATIENT: Alexandra Warren DOB: 1980/09/17   REASON FOR VISIT: Follow-up for migraine, depression, insomnia,  anxiety disorder HISTORY FROM: Patient    HISTORY OF PRESENT ILLNESS: HISTORY YYShe has history of migraines since 2008, approximately age 35 or 78, typical migraine is right parietal retro-orbital area with severe achy pain, sometimes was associated with light sensitivity and noise sensitivity, lasting a couple hours or until able to go to sleep. Responded very well to Axert. Triggers for her migraines are stress, hormonal change, weather change. She can have up to 20-30 migraines days each month, responding to Axert 75% of the time. A quarter of the time even though she was taking repeated those of Axert, she would suffer prolonged severe protracted migraine that can last a couple of days. She has tried Imitrex in the past which caused neck tightness.  She has been taking Topamax 100 mg every morning, which has been very helpful, previously tried beta blockers and amitriptyline without help.  She also has a history of depression and generalized anxiety taking Cymbalta 120 mg every day. Her father had history of migraine. She was previous under the care of local neurologist Dr. Silvio Pate who since has left the practice.  MRI of the brain was normal 2009. She is now taking Topamax 100mg  bid and Riboflavin/Magnesium. Axert is effective   UPDATE July 14th 2016: Alexandra Warren, 35 year old female returns for follow-up. She has a history of migraine headaches and has had less headaches recently since she started working out at her home. She still says she can have 5-10 headaches, not every month. Some of her headaches are clustered  around her menstrual cycle.  Axert continues to help acutely and for the occasional nausea she takes Zofran. She is currently taking Topamax 200 mg at night. She is also taking riboflavin and magnesium. The only food triggers she is  aware of is red wine. MRI of the brain in the past has been normal. She canceled her sleep study if she did not feel her daytime drowsiness was enough to warrant this. She returns for reevaluation UPDATE 08/30/2017[name], 35 year old female returns for follow-up she has history of migraine headaches which are much improved than when last seen. She has changed jobs and has less stress. In addition she has changed psychiatrists and had some medication changes.Axert continues to work acutely and she is on Topamax as a preventive and Zofran for nausea. She also takes over-the-counter riboflavin and magnesium. She tries to exercise by walking. She returns for reevaluation   REVIEW OF SYSTEMS: Full 14 system review of systems performed and notable only for those listed, all others are neg:  Constitutional: neg  Cardiovascular: neg Ear/Nose/Throat: neg  Skin: neg Eyes: neg Respiratory: neg Gastroitestinal: neg  Hematology/Lymphatic: neg  Endocrine: neg Musculoskeletal:neg Allergy/Immunology: neg Neurological: neg Psychiatric: neg Sleep : neg   ALLERGIES: No Known Allergies  HOME MEDICATIONS: Outpatient Medications Prior to Visit  Medication Sig Dispense Refill  . almotriptan (AXERT) 12.5 MG tablet Take 1 tablet (12.5 mg total) by mouth as needed for migraine. 15 tablet 11  . baclofen (LIORESAL) 10 MG tablet   0  . CELEBREX 200 MG capsule Take 1 capsule by mouth daily.    . DULoxetine (CYMBALTA) 60 MG capsule Take 120 mg by mouth daily.     Marland Kitchen esomeprazole (NEXIUM) 20 MG capsule Take 20 mg by mouth daily at 12 noon.    . magnesium oxide (MAG-OX) 400 MG tablet Take  1 tablet (400 mg total) by mouth 2 (two) times daily.    . mirtazapine (REMERON) 30 MG tablet AFTER ONE MONTH ON 15MG  TABLET START TAKING ONE 30MG  TABLET AT BEDTIME  0  . Omega-3 Fatty Acids (FISH OIL) 875 MG CAPS Take 2 capsules by mouth daily.    . ondansetron (ZOFRAN) 4 MG tablet Take 1 tablet (4 mg total) by mouth every  8 (eight) hours as needed for nausea or vomiting. 20 tablet 5  . pramipexole (MIRAPEX) 0.5 MG tablet Take 1.5 mg by mouth at bedtime.  0  . Riboflavin 100 MG TABS Take 1 tablet (100 mg total) by mouth 2 (two) times daily.  0  . topiramate (TOPAMAX) 100 MG tablet Take 1 tablet (100 mg total) by mouth 2 (two) times daily. 60 tablet 11  . traMADol (ULTRAM) 50 MG tablet Take 50 mg by mouth 4 (four) times daily as needed for pain.    Marland Kitchen zolpidem (AMBIEN) 10 MG tablet Take 10 mg by mouth at bedtime as needed for sleep.     Marland Kitchen gabapentin (NEURONTIN) 300 MG capsule Take 300 capsules by mouth 3 (three) times daily.     Marland Kitchen LORazepam (ATIVAN) 1 MG tablet Take 1 tablet by mouth 2 (two) times daily as needed.      No facility-administered medications prior to visit.     PAST MEDICAL HISTORY: Past Medical History:  Diagnosis Date  . Acid reflux   . Anxiety and depression    Dr. Gala Murdoch, MD  . Back pain   . Migraine     PAST SURGICAL HISTORY: Past Surgical History:  Procedure Laterality Date  . CHOLECYSTECTOMY  12/2004    FAMILY HISTORY: History reviewed. No pertinent family history.  SOCIAL HISTORY: Social History   Social History  . Marital status: Married    Spouse name: Corene Cornea  . Number of children: 0  . Years of education: MS   Occupational History  .  [employer]  . [employer]   Social History Main Topics  . Smoking status: Never Smoker  . Smokeless tobacco: Never Used  . Alcohol use No  . Drug use: No  . Sexual activity: Not on file   Other Topics Concern  . Not on file   Social History Narrative   Patient lives at home with her spouse.    Caffeine Use: 1-2 sodas daily     PHYSICAL EXAM  Vitals:   09/19/15 1524  BP: (!) 143/88  Pulse: (!) 113  Height: 5\' 6"  (1.676 m)   There is no height or weight on file to calculate BMI. Generalized: Well developed, in no acute distress  Head: normocephalic and atraumatic.  Oropharynx benign  Neck: Supple, no carotid bruits  Musculoskeletal: No deformity   Neurological examination  Mentation: Alert oriented to time, place, history taking. Follows all commands speech and language fluent Cranial nerve II-XII: Pupils were equal round reactive to light extraocular movements were full, visual field were full on confrontational test. Facial sensation and strength were normal. hearing was intact to finger rubbing bilaterally. Uvula tongue midline. head turning and shoulder shrug and were normal and symmetric.Tongue protrusion into cheek strength was normal. Motor: normal bulk and tone, full strength in the BUE, BLE, fine finger movements normal, no pronator drift. No focal weakness Coordination: finger-nose-finger, heel-to-shin bilaterally, no dysmetria Reflexes: Deep tendon reflexes in the upper and lower extremities are present and symmetric.  Gait and Station: Rising up from  seated position without assistance, normal stance, without trunk ataxia, moderate stride, good arm swing, smooth turning, able to perform tiptoe, and heel walking without difficulty.    DIAGNOSTIC DATA (LABS, IMAGING, TESTING) -  ASSESSMENT AND PLAN  35 y.o. year old female  has a past medical history of Migraine; Anxiety and depression; and insomnia here to follow-up.The patient is a current patient of Dr. Krista Blue  who is out of the office today . This note is sent to the work in doctor.     Continue Axert acutely will refill Continue Zofran as needed for nausea will refill Continue Topamax 100 mg twice daily will refill Continue magnesium Continue riboflavin Avoid  migraine triggers Follow-up in one year Call for increase in headaches Dennie Bible, [facility], [facility], [facility] [address], [place], [place] [address] [phone]

## 2016-02-20 ENCOUNTER — Ambulatory Visit: Payer: Commercial Managed Care - HMO | Admitting: Psychology

## 2016-03-05 ENCOUNTER — Ambulatory Visit (INDEPENDENT_AMBULATORY_CARE_PROVIDER_SITE_OTHER): Payer: 59 | Admitting: Psychology

## 2016-03-05 DIAGNOSIS — F411 Generalized anxiety disorder: Secondary | ICD-10-CM | POA: Diagnosis not present

## 2016-03-18 ENCOUNTER — Ambulatory Visit: Payer: 59 | Admitting: Psychology

## 2016-03-19 ENCOUNTER — Ambulatory Visit (INDEPENDENT_AMBULATORY_CARE_PROVIDER_SITE_OTHER): Payer: 59 | Admitting: Psychology

## 2016-03-19 DIAGNOSIS — F411 Generalized anxiety disorder: Secondary | ICD-10-CM | POA: Diagnosis not present

## 2016-03-26 ENCOUNTER — Ambulatory Visit (INDEPENDENT_AMBULATORY_CARE_PROVIDER_SITE_OTHER): Payer: 59 | Admitting: Psychology

## 2016-03-26 DIAGNOSIS — F411 Generalized anxiety disorder: Secondary | ICD-10-CM | POA: Diagnosis not present

## 2016-04-02 ENCOUNTER — Ambulatory Visit: Payer: 59 | Admitting: Psychology

## 2016-04-09 ENCOUNTER — Ambulatory Visit: Payer: 59 | Admitting: Psychology

## 2016-04-14 ENCOUNTER — Ambulatory Visit (INDEPENDENT_AMBULATORY_CARE_PROVIDER_SITE_OTHER): Payer: 59 | Admitting: Psychology

## 2016-04-14 DIAGNOSIS — F411 Generalized anxiety disorder: Secondary | ICD-10-CM

## 2016-04-30 ENCOUNTER — Ambulatory Visit (INDEPENDENT_AMBULATORY_CARE_PROVIDER_SITE_OTHER): Payer: 59 | Admitting: Psychology

## 2016-04-30 DIAGNOSIS — F411 Generalized anxiety disorder: Secondary | ICD-10-CM | POA: Diagnosis not present

## 2016-05-07 ENCOUNTER — Ambulatory Visit (INDEPENDENT_AMBULATORY_CARE_PROVIDER_SITE_OTHER): Payer: 59 | Admitting: Psychology

## 2016-05-07 DIAGNOSIS — F411 Generalized anxiety disorder: Secondary | ICD-10-CM | POA: Diagnosis not present

## 2016-05-14 ENCOUNTER — Ambulatory Visit: Payer: Self-pay | Admitting: Psychology

## 2016-05-21 ENCOUNTER — Ambulatory Visit: Payer: 59 | Admitting: Psychology

## 2016-06-12 DIAGNOSIS — G894 Chronic pain syndrome: Secondary | ICD-10-CM | POA: Diagnosis not present

## 2016-06-12 DIAGNOSIS — M791 Myalgia: Secondary | ICD-10-CM | POA: Diagnosis not present

## 2016-06-19 DIAGNOSIS — G43709 Chronic migraine without aura, not intractable, without status migrainosus: Secondary | ICD-10-CM | POA: Diagnosis not present

## 2016-06-19 DIAGNOSIS — Z0001 Encounter for general adult medical examination with abnormal findings: Secondary | ICD-10-CM | POA: Diagnosis not present

## 2016-06-19 DIAGNOSIS — M797 Fibromyalgia: Secondary | ICD-10-CM | POA: Diagnosis not present

## 2016-08-18 ENCOUNTER — Encounter: Payer: Self-pay | Admitting: Gastroenterology

## 2016-09-18 ENCOUNTER — Ambulatory Visit: Payer: Commercial Managed Care - HMO | Admitting: Nurse Practitioner

## 2016-10-20 ENCOUNTER — Encounter (INDEPENDENT_AMBULATORY_CARE_PROVIDER_SITE_OTHER): Payer: Self-pay

## 2016-10-20 ENCOUNTER — Ambulatory Visit (INDEPENDENT_AMBULATORY_CARE_PROVIDER_SITE_OTHER): Payer: 59 | Admitting: Gastroenterology

## 2016-10-20 ENCOUNTER — Encounter: Payer: Self-pay | Admitting: Gastroenterology

## 2016-10-20 VITALS — BP 110/90 | HR 120 | Ht 66.0 in | Wt 196.4 lb

## 2016-10-20 DIAGNOSIS — K644 Residual hemorrhoidal skin tags: Secondary | ICD-10-CM | POA: Diagnosis not present

## 2016-10-20 NOTE — Progress Notes (Signed)
FEVEN ALDERFER    [id number]    [birth year]  Primary Care Physician:Jackson, Hulen Shouts, PA-C  Referring Physician: Jake Samples, PA-C [address] [place], [place] [address]  Chief complaint:  Hemorrhoids,   HPI: 36 year old female for new patient visit with complaints of symptomatic hemorrhoids. Patient was anxious and tearful during the visit. She has generalized anxiety disorder and depression, is separated from her spouse and going through a divorce. She was having significant change in bowel habits with predominant diarrhea when she was getting separated from her spouse. Her bowel habits are more regular now and denies any constipation or diarrhea. No protrusion or rectal pain. No blood per rectum. She feels some tissue when she tries to wipe and wants to have that removed Denies any nausea, vomiting, abdominal pain, melena or bright red blood per rectum No family history of colon cancer or IBD.     Outpatient Encounter Prescriptions as of 10/20/2016  Medication Sig  . almotriptan (AXERT) 12.5 MG tablet Take 1 tablet (12.5 mg total) by mouth as needed for migraine.  . baclofen (LIORESAL) 10 MG tablet   . CELEBREX 200 MG capsule Take 1 capsule by mouth daily.  . clonazePAM (KLONOPIN) 1 MG tablet Take 1 mg by mouth 3 (three) times daily as needed for anxiety.  . DULoxetine (CYMBALTA) 60 MG capsule Take 120 mg by mouth daily.   Marland Kitchen esomeprazole (NEXIUM) 20 MG capsule Take 20 mg by mouth daily at 12 noon.  . magnesium oxide (MAG-OX) 400 MG tablet Take 1 tablet (400 mg total) by mouth 2 (two) times daily.  . mirtazapine (REMERON) 30 MG tablet AFTER ONE MONTH ON 15MG  TABLET START TAKING ONE 30MG  TABLET AT BEDTIME  . mirtazapine (REMERON) 45 MG tablet Take 45 mg by mouth at bedtime.  . Norgestimate-Ethinyl Estradiol Triphasic (TRINESSA LO) 0.18/0.215/0.25 MG-25 MCG tab Take 1 tablet by mouth daily. Takes for 21 days , skip placebo and start next pack.  .  pregabalin (LYRICA) 150 MG capsule Take 150 mg by mouth 2 (two) times daily.  Marland Kitchen topiramate (TOPAMAX) 100 MG tablet Take 1 tablet (100 mg total) by mouth 2 (two) times daily.  . traMADol (ULTRAM) 50 MG tablet Take 50 mg by mouth 4 (four) times daily as needed for pain.  . TRINTELLIX 5 MG TABS daily.  Marland Kitchen zolpidem (AMBIEN) 10 MG tablet Take 10 mg by mouth at bedtime as needed for sleep.   . [DISCONTINUED] Omega-3 Fatty Acids (FISH OIL) 875 MG CAPS Take 2 capsules by mouth daily.  . [DISCONTINUED] ondansetron (ZOFRAN) 4 MG tablet Take 1 tablet (4 mg total) by mouth every 8 (eight) hours as needed for nausea or vomiting.  . [DISCONTINUED] pramipexole (MIRAPEX) 0.5 MG tablet Take 1.5 mg by mouth at bedtime.  . [DISCONTINUED] Riboflavin 100 MG TABS Take 1 tablet (100 mg total) by mouth 2 (two) times daily.   No facility-administered encounter medications on file as of 10/20/2016.     Allergies as of 10/20/2016  . (No Known Allergies)    Past Medical History:  Diagnosis Date  . Acid reflux   . Anxiety and depression    Dr. Gala Murdoch, MD  . Back pain   . Fibromyalgia   . History of gallstones   . Migraine     Past Surgical History:  Procedure Laterality Date  . CHOLECYSTECTOMY  12/2004    Family History  Problem Relation Age of Onset  .  Healthy Mother   . Hypertension Father   . Lung cancer Paternal Grandfather   . Colon cancer Neg Hx   . Rectal cancer Neg Hx   . Liver cancer Neg Hx   . Stomach cancer Neg Hx   . Esophageal cancer Neg Hx     Social History   Social History  . Marital status: Married    Spouse name: Corene Cornea  . Number of children: 0  . Years of education: MS   Occupational History  . accountant    Social History Main Topics  . Smoking status: Never Smoker  . Smokeless tobacco: Never Used  . Alcohol use No  . Drug use: No  . Sexual activity: Not on file   Other Topics Concern  . Not on file   Social History Narrative   Patient lives at home with  her spouse.    Caffeine Use: 1-2 sodas daily      Review of systems: Review of Systems  Constitutional: Negative for fever and chills.  HENT: Negative.   Eyes: Negative for blurred vision.  Respiratory: Negative for cough, shortness of breath and wheezing.   Cardiovascular: Negative for chest pain and palpitations.  Gastrointestinal: as per HPI Genitourinary: Negative for dysuria, urgency, frequency and hematuria.  Musculoskeletal: Negative for myalgias, back pain and joint pain.  Skin: Negative for itching and rash.  Neurological: Negative for dizziness, tremors, focal weakness, seizures and loss of consciousness.  Endo/Heme/Allergies: Positive for seasonal allergies.  Psychiatric/Behavioral: Negative for suicidal ideas and hallucinations. Positive for depression and anxiety All other systems reviewed and are negative.   Physical Exam: Vitals:   10/20/16 1333  BP: 110/90  Pulse: (!) 120   Body mass index is 31.7 kg/m. Gen:      No acute distress HEENT:  EOMI, sclera anicteric Neck:     No masses; no thyromegaly Lungs:    Clear to auscultation bilaterally; normal respiratory effort CV:         Regular rate and rhythm; no murmurs Abd:      + bowel sounds; soft, non-tender; no palpable masses, no distension Ext:    No edema; adequate peripheral perfusion Skin:      Warm and dry; no rash Neuro: alert and oriented x 3 Psych: normal mood and affect Rectal exam: Normal anal sphincter tone, no anal fissure or external hemorrhoids. Anal skin tag. Anoscopy: Small internal hemorrhoids, no active bleeding, normal dentate line, no visible nodules   Data Reviewed:  Reviewed labs, radiology imaging, old records and pertinent past GI work up   Assessment and Plan/Recommendations:  36 year old female with history of depression and generalized anxiety disorder here with complaint of hemorrhoids for evaluation. On exam patient does not have external hemorrhoids, and only small  grade 1 internal hemorrhoids. She was concerned about the anal skin tag, reassured her and provided reading material Informed patient that if she still wants the skin tag removed, we'll refer to [facility] surgery Return as needed    K. Denzil Magnuson , MD [phone] Mon-Fri 8a-5p [phone] after 5p, weekends, holidays  CC: Jake Samples, [place]*   .......Marland Kitchen

## 2016-10-20 NOTE — Patient Instructions (Signed)
If you decide you want to have a referral done with [facility], please contact us at [phone] and we will refer you for anal skin tag removal  Follow up as needed
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
## 2016-11-24 ENCOUNTER — Encounter: Payer: Self-pay | Admitting: Nurse Practitioner

## 2016-11-24 ENCOUNTER — Ambulatory Visit: Payer: 59 | Admitting: Nurse Practitioner

## 2016-11-24 VITALS — BP 143/98 | HR 114 | Ht 66.0 in | Wt 189.6 lb

## 2016-11-24 DIAGNOSIS — G43009 Migraine without aura, not intractable, without status migrainosus: Secondary | ICD-10-CM

## 2016-11-24 DIAGNOSIS — F411 Generalized anxiety disorder: Secondary | ICD-10-CM | POA: Diagnosis not present

## 2016-11-24 MED ORDER — ONDANSETRON HCL 4 MG PO TABS: 4.0000 mg | ORAL_TABLET | Freq: Three times a day (TID) | ORAL | 1 refills | Status: DC | PRN

## 2016-11-24 MED ORDER — TOPIRAMATE 50 MG PO TABS: 50.0000 mg | ORAL_TABLET | Freq: Every day | ORAL | 1 refills | Status: DC

## 2016-11-24 NOTE — Patient Instructions (Signed)
Continue Axert acutely will refill Continue Zofran as needed for nausea will refill Restart Topamax 50 mg at night  will refill Migraine tracker app to record headaches Avoid  migraine triggers Follow-up in 3 to 4 months

## 2016-12-10 DIAGNOSIS — M791 Myalgia, unspecified site: Secondary | ICD-10-CM | POA: Diagnosis not present

## 2016-12-10 DIAGNOSIS — Z79899 Other long term (current) drug therapy: Secondary | ICD-10-CM | POA: Diagnosis not present

## 2016-12-10 DIAGNOSIS — M47896 Other spondylosis, lumbar region: Secondary | ICD-10-CM | POA: Diagnosis not present

## 2016-12-10 DIAGNOSIS — Z79891 Long term (current) use of opiate analgesic: Secondary | ICD-10-CM | POA: Diagnosis not present

## 2016-12-10 DIAGNOSIS — G894 Chronic pain syndrome: Secondary | ICD-10-CM | POA: Diagnosis not present

## 2017-02-05 DIAGNOSIS — J018 Other acute sinusitis: Secondary | ICD-10-CM | POA: Diagnosis not present

## 2017-02-12 ENCOUNTER — Ambulatory Visit: Payer: 59 | Admitting: Nurse Practitioner

## 2017-03-17 DIAGNOSIS — Z1389 Encounter for screening for other disorder: Secondary | ICD-10-CM | POA: Diagnosis not present

## 2017-03-17 DIAGNOSIS — J069 Acute upper respiratory infection, unspecified: Secondary | ICD-10-CM | POA: Diagnosis not present

## 2017-04-17 DIAGNOSIS — J029 Acute pharyngitis, unspecified: Secondary | ICD-10-CM | POA: Diagnosis not present

## 2017-04-17 DIAGNOSIS — J020 Streptococcal pharyngitis: Secondary | ICD-10-CM | POA: Diagnosis not present
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
## 2017-04-22 ENCOUNTER — Ambulatory Visit: Payer: 59 | Admitting: Nurse Practitioner

## 2017-04-23 ENCOUNTER — Encounter: Payer: Self-pay | Admitting: Nurse Practitioner

## 2017-05-06 DIAGNOSIS — J029 Acute pharyngitis, unspecified: Secondary | ICD-10-CM | POA: Diagnosis not present

## 2017-05-12 DIAGNOSIS — J019 Acute sinusitis, unspecified: Secondary | ICD-10-CM | POA: Diagnosis not present

## 2017-05-12 DIAGNOSIS — J029 Acute pharyngitis, unspecified: Secondary | ICD-10-CM | POA: Diagnosis not present

## 2017-05-12 DIAGNOSIS — J351 Hypertrophy of tonsils: Secondary | ICD-10-CM | POA: Diagnosis not present

## 2017-05-22 DIAGNOSIS — J019 Acute sinusitis, unspecified: Secondary | ICD-10-CM | POA: Diagnosis not present

## 2017-05-22 DIAGNOSIS — K219 Gastro-esophageal reflux disease without esophagitis: Secondary | ICD-10-CM | POA: Diagnosis not present

## 2017-05-22 DIAGNOSIS — J309 Allergic rhinitis, unspecified: Secondary | ICD-10-CM | POA: Diagnosis not present

## 2017-06-09 DIAGNOSIS — M542 Cervicalgia: Secondary | ICD-10-CM | POA: Diagnosis not present

## 2017-06-09 DIAGNOSIS — G894 Chronic pain syndrome: Secondary | ICD-10-CM | POA: Diagnosis not present

## 2017-06-09 DIAGNOSIS — Z79899 Other long term (current) drug therapy: Secondary | ICD-10-CM | POA: Diagnosis not present

## 2017-06-09 DIAGNOSIS — M47896 Other spondylosis, lumbar region: Secondary | ICD-10-CM | POA: Diagnosis not present

## 2017-06-09 DIAGNOSIS — M791 Myalgia, unspecified site: Secondary | ICD-10-CM | POA: Diagnosis not present

## 2017-06-09 DIAGNOSIS — Z79891 Long term (current) use of opiate analgesic: Secondary | ICD-10-CM | POA: Diagnosis not present

## 2017-06-10 DIAGNOSIS — N390 Urinary tract infection, site not specified: Secondary | ICD-10-CM | POA: Diagnosis not present

## 2017-06-10 DIAGNOSIS — R300 Dysuria: Secondary | ICD-10-CM | POA: Diagnosis not present
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
## 2017-08-11 ENCOUNTER — Encounter: Payer: Self-pay | Admitting: Nurse Practitioner

## 2017-08-11 ENCOUNTER — Ambulatory Visit: Payer: 59 | Admitting: Nurse Practitioner

## 2017-08-11 ENCOUNTER — Telehealth: Payer: Self-pay | Admitting: Nurse Practitioner

## 2017-08-11 VITALS — BP 117/78 | HR 106 | Ht 66.0 in | Wt 164.2 lb

## 2017-08-11 DIAGNOSIS — F411 Generalized anxiety disorder: Secondary | ICD-10-CM | POA: Diagnosis not present

## 2017-08-11 DIAGNOSIS — G43009 Migraine without aura, not intractable, without status migrainosus: Secondary | ICD-10-CM

## 2017-08-11 MED ORDER — TOPIRAMATE 50 MG PO TABS: 50.0000 mg | ORAL_TABLET | Freq: Every day | ORAL | 1 refills | Status: DC

## 2017-08-11 MED ORDER — PREDNISONE 10 MG PO TABS: 10.0000 mg | ORAL_TABLET | Freq: Every day | ORAL | 0 refills | Status: DC

## 2017-08-11 MED ORDER — ALMOTRIPTAN MALATE 12.5 MG PO TABS: 12.5000 mg | ORAL_TABLET | ORAL | 6 refills | Status: DC | PRN

## 2017-08-11 MED ORDER — ONDANSETRON HCL 4 MG PO TABS: 4.0000 mg | ORAL_TABLET | Freq: Three times a day (TID) | ORAL | 1 refills | Status: DC | PRN

## 2017-08-11 NOTE — Telephone Encounter (Signed)
Pt called stating that [facility] did not receive the rx for predniSONE (DELTASONE) 10 MG tablet requesting we resend medication.

## 2017-08-11 NOTE — Patient Instructions (Signed)
Continue Axert acutely will refill Continue Zofran as needed for nausea will refill Continue  Topamax 50 mg at night  will refill Migraine tracker app to record headaches Avoid  migraine triggers Prednisone 10 mg 6-day Dosepak Follow-up in 6 months

## 2017-08-11 NOTE — Progress Notes (Signed)
I have read the note, and I agree with the clinical assessment and plan.  Kyleah Pensabene K Glema Takaki   

## 2017-08-12 ENCOUNTER — Telehealth: Payer: Self-pay | Admitting: Nurse Practitioner

## 2017-08-12 NOTE — Telephone Encounter (Signed)
LVM advising patient this RN called [facility] last night around 5:30 pm but phone wasn't picked up after holding several minutes. Requested she call today and let phone staff know if she was able to get Prednisone last night as she may have called pharmacy too soon to check on it yesterday. Left office number.

## 2017-08-12 NOTE — Telephone Encounter (Signed)
According to our headache specialist Topamax interferes with birth control at doses of 200 mg or greater daily.  She is only taking 50 mg and she has been on the medication for quite some time.

## 2017-08-12 NOTE — Telephone Encounter (Signed)
Spoke with patient and advised her of Carolyn's conversation with Dr Jaynee Eagles.  Advised that she can always use additional protection; this RN cannot give her a 100% guarantee she wouldn't get pregnant. However she is on a dose much lower than 200 mg and has been for quite a while. She stated she was relieved to hear this message, verbalized understanding, appreciation.

## 2017-08-12 NOTE — Telephone Encounter (Signed)
Pt called stating the pharmacist and herself are concerned with topiramate (TOPAMAX) 50 MG tablet interfering with her birth control. Please requesting a call to discuss alternate medications

## 2017-08-13 NOTE — Telephone Encounter (Signed)
Patient did receive prendisone.

## 2017-08-31 ENCOUNTER — Telehealth: Payer: Self-pay | Admitting: Nurse Practitioner

## 2017-08-31 MED ORDER — TOPIRAMATE 50 MG PO TABS: 50.0000 mg | ORAL_TABLET | Freq: Two times a day (BID) | ORAL | 6 refills | Status: DC

## 2017-08-31 NOTE — Telephone Encounter (Signed)
Spoke to pt.  For the last 3 days, she has had migraine w/ nausea.   Level 7-8 and after taking axert to level 2, but keeps coming back.  Migraine went away for a week after prednisone.    Asking for increase of topamax.      Has not had infusion here previously.   Please advise.

## 2017-08-31 NOTE — Telephone Encounter (Signed)
Pt called stating she has been experiencing headaches for the past 3 days, requesting a call to discuss upping the dosing for topiramate (TOPAMAX) 50 MG tablet

## 2017-08-31 NOTE — Telephone Encounter (Signed)
She can increase to 50mg  in the am and 50mg  12 hours later.RX sent

## 2017-08-31 NOTE — Telephone Encounter (Signed)
LMVM for pt to return call. Taking topamax 50mg  po qhs.

## 2017-08-31 NOTE — Telephone Encounter (Signed)
I spoke to pt and relayed that her topamax can be increased to 50mg  po BID (12 hours apart).   She verbalized understanding. Will call us back as needed.

## 2017-09-03 NOTE — Telephone Encounter (Signed)
I spoke to [place] and she said tomorrow at 1000.  I related to pt in mobile VM, and I asked that she call back to confirm.

## 2017-09-03 NOTE — Telephone Encounter (Signed)
She would like something acutely.  I relayed will check with intrafusion to see if available.   Tina with intrafusion stated ok now to 1400.  Pt has driver if needs one.  Please advise on medicaitons:  Depacon 1000mg  IV, Toradol 30mg IV, compazine 10mg  IV.

## 2017-09-03 NOTE — Telephone Encounter (Signed)
Noted.  Alexandra Warren given orders.

## 2017-09-03 NOTE — Telephone Encounter (Signed)
Pt called to confirm infusion appt tomorrow at 10

## 2017-09-03 NOTE — Telephone Encounter (Signed)
Chart reviewed, patient is now taking Topamax 50mg  bid as migraine prevention,  Previously, she has tried beta blocker, amitriptyline without helping her headache.  We can offer her options of  1. Higher dose of Topamax if she can tolerate it, to 100 mg twice a day 2. nortriptyline, titrating to 20 mg every night as migraine prevention or Effexor 37.5 mg ER daily as migraine prevention. 3. Ajovy SQ as migraine prevention

## 2017-09-03 NOTE — Telephone Encounter (Signed)
When call back to relay time she asked to come in tomorrow.  I would have to check with Otila Kluver.

## 2017-09-03 NOTE — Telephone Encounter (Signed)
Pt called stating 6 out of the last 7 days she has had a very intense headache. Stating she has taking a mixture between all over the counter mediation and all prescribed medication but nothing has helped. Requesting a call to advise

## 2017-09-03 NOTE — Telephone Encounter (Addendum)
I spoke to pt.  She states that she has continued with migraines even with taking multiple medications.  She states that the medications help but the migraines keeps coming back.  Experiences nausea, pain forehead, face, teeth, tender spots on scalp.  Lights bothersome.  Level 5 right now.  Taking topamax 50mg  po bid., axert prn, excedrin migraine, and was given prednisone from carolyn/np when last seen and this helped for about a week.  Please advise.  She has never had infusion.

## 2017-09-17 DIAGNOSIS — Z01411 Encounter for gynecological examination (general) (routine) with abnormal findings: Secondary | ICD-10-CM | POA: Diagnosis not present

## 2017-09-17 DIAGNOSIS — Z124 Encounter for screening for malignant neoplasm of cervix: Secondary | ICD-10-CM | POA: Diagnosis not present

## 2017-09-17 DIAGNOSIS — Z308 Encounter for other contraceptive management: Secondary | ICD-10-CM | POA: Diagnosis not present

## 2017-09-17 DIAGNOSIS — E663 Overweight: Secondary | ICD-10-CM | POA: Diagnosis not present

## 2017-10-06 DIAGNOSIS — G43709 Chronic migraine without aura, not intractable, without status migrainosus: Secondary | ICD-10-CM | POA: Diagnosis not present

## 2017-10-06 DIAGNOSIS — Z049 Encounter for examination and observation for unspecified reason: Secondary | ICD-10-CM | POA: Diagnosis not present

## 2017-10-27 ENCOUNTER — Telehealth: Payer: Self-pay | Admitting: Nurse Practitioner

## 2017-10-27 MED ORDER — NORTRIPTYLINE HCL 10 MG PO CAPS: 10.0000 mg | ORAL_CAPSULE | Freq: Every day | ORAL | 3 refills | Status: DC

## 2017-10-27 NOTE — Telephone Encounter (Signed)
Pt is asking for a call to discuss a change in her medication because she has had 10 head aches within a month, please call to discuss

## 2017-10-27 NOTE — Telephone Encounter (Signed)
We will add on nortriptyline take as directed. RX to pharmacy

## 2017-10-27 NOTE — Telephone Encounter (Signed)
Spoke to pt and she is having 10 migraine headaches per month.  Has had migraine for the last 2 days, level 10, with nausea.   Axert helps but headache comes back. Takes zofran helps.   Had depacon infusion which she stated helped her frequency and severity.     She is currently taking 50mg  po bid topamax.  I relayed or her Dr. Rhea Belton recommendations:   Chart reviewed, patient is now taking Topamax 50mg  bid as migraine prevention,  Previously, she has tried beta blocker, amitriptyline without helping her headache.  We can offer her options of  1. Higher dose of Topamax if she can tolerate it, to 100 mg twice a day 2. nortriptyline, titrating to 20 mg every night as migraine prevention or Effexor 37.5 mg ER daily as migraine prevention. 3. Ajovy SQ as migraine prevention       She is up to try any of those.  Please advise.  Thanks.

## 2017-10-27 NOTE — Telephone Encounter (Signed)
Spoke to pt and relayed that CM/NP ordered nortriptyline 10mg  po qhs for one week and then increase to 2 caps at bedtime. To take in addition to topiramate.  Hopefully this will help. She will let us know.  She verbalized understanding.

## 2017-10-27 NOTE — Addendum Note (Signed)
Addended by: Otilio Jefferson on: 10/27/2017 03:26 PM   Modules accepted: Orders

## 2017-10-29 DIAGNOSIS — Z0000 Encounter for general adult medical examination without abnormal findings: Secondary | ICD-10-CM | POA: Diagnosis not present

## 2017-10-29 DIAGNOSIS — Z1389 Encounter for screening for other disorder: Secondary | ICD-10-CM | POA: Diagnosis not present

## 2017-10-29 DIAGNOSIS — Z6828 Body mass index (BMI) 28.0-28.9, adult: Secondary | ICD-10-CM | POA: Diagnosis not present

## 2017-10-29 DIAGNOSIS — Z6826 Body mass index (BMI) 26.0-26.9, adult: Secondary | ICD-10-CM | POA: Diagnosis not present

## 2017-10-29 DIAGNOSIS — D485 Neoplasm of uncertain behavior of skin: Secondary | ICD-10-CM | POA: Diagnosis not present

## 2017-10-29 DIAGNOSIS — L859 Epidermal thickening, unspecified: Secondary | ICD-10-CM | POA: Diagnosis not present

## 2017-10-29 DIAGNOSIS — E663 Overweight: Secondary | ICD-10-CM | POA: Diagnosis not present

## 2017-11-10 ENCOUNTER — Telehealth: Payer: Self-pay | Admitting: Nurse Practitioner

## 2017-11-10 ENCOUNTER — Encounter: Payer: Self-pay | Admitting: *Deleted

## 2017-11-10 NOTE — Telephone Encounter (Signed)
Yes make it brief

## 2017-11-10 NOTE — Telephone Encounter (Signed)
Patient needs a note stating she is being treated for chronic migraines. Please call patient when ready for pick up.

## 2017-11-10 NOTE — Telephone Encounter (Signed)
LMVM for pt that returned call.  °

## 2017-11-11 NOTE — Telephone Encounter (Signed)
Letter written and signed.  Pt called and LMVM it is ready for pick up.

## 2017-12-03 DIAGNOSIS — M47896 Other spondylosis, lumbar region: Secondary | ICD-10-CM | POA: Diagnosis not present

## 2017-12-03 DIAGNOSIS — M791 Myalgia, unspecified site: Secondary | ICD-10-CM | POA: Diagnosis not present

## 2017-12-03 DIAGNOSIS — M7918 Myalgia, other site: Secondary | ICD-10-CM | POA: Diagnosis not present

## 2017-12-03 DIAGNOSIS — G894 Chronic pain syndrome: Secondary | ICD-10-CM | POA: Diagnosis not present

## 2018-01-21 DIAGNOSIS — E6609 Other obesity due to excess calories: Secondary | ICD-10-CM | POA: Diagnosis not present

## 2018-01-21 DIAGNOSIS — Z6830 Body mass index (BMI) 30.0-30.9, adult: Secondary | ICD-10-CM | POA: Diagnosis not present

## 2018-01-21 DIAGNOSIS — R220 Localized swelling, mass and lump, head: Secondary | ICD-10-CM | POA: Diagnosis not present

## 2018-01-21 DIAGNOSIS — N342 Other urethritis: Secondary | ICD-10-CM | POA: Diagnosis not present

## 2018-02-25 ENCOUNTER — Ambulatory Visit: Payer: 59 | Admitting: Nurse Practitioner

## 2018-03-08 ENCOUNTER — Ambulatory Visit (INDEPENDENT_AMBULATORY_CARE_PROVIDER_SITE_OTHER): Payer: BLUE CROSS/BLUE SHIELD | Admitting: Neurology

## 2018-03-08 ENCOUNTER — Encounter: Payer: Self-pay | Admitting: Neurology

## 2018-03-08 VITALS — BP 135/90 | HR 99 | Ht 66.0 in | Wt 189.0 lb

## 2018-03-08 DIAGNOSIS — G43009 Migraine without aura, not intractable, without status migrainosus: Secondary | ICD-10-CM | POA: Diagnosis not present

## 2018-03-08 MED ORDER — ALMOTRIPTAN MALATE 12.5 MG PO TABS: 12.5000 mg | ORAL_TABLET | ORAL | 11 refills | Status: DC | PRN

## 2018-03-08 MED ORDER — ONDANSETRON HCL 4 MG PO TABS: 4.0000 mg | ORAL_TABLET | Freq: Three times a day (TID) | ORAL | 5 refills | Status: DC | PRN

## 2018-03-08 MED ORDER — ERENUMAB-AOOE 70 MG/ML ~~LOC~~ SOAJ: 70.0000 mg | SUBCUTANEOUS | 11 refills | Status: DC

## 2018-03-08 NOTE — Progress Notes (Signed)
PATIENT: Alexandra Warren DOB: [birth year]  HISTORY OF PRESENT ILLNESS: Alexandra Warren is a 38 year-old left-handed Caucasian female referred by her primary care physician PA Delman Cheadle for evaluation of migraine.   She has history of migraines since 2008, approximately age 35 or 15, typical migraine is right parietal retro-orbital area with severe achy pain, sometimes was associated with light sensitivity and noise sensitivity, lasting a couple hours or until able to go to sleep. Responded very well to Axert. Triggers for her migraines are stress, hormonal change, weather change.  She can have up to 20-30 migraines days each month, responding to Axert 75% of the time. A quarter of the time even though she was taking repeated those of Axert, she would suffer prolonged severe protracted migraine that can last a couple of days. She has tried Imitrex in the past which caused neck tightness.   She has been taking Topamax 100 mg every morning, which has been very helpful, previously tried beta blockers and amitriptyline without help.   She also has a history of depression and generalized anxiety taking Cymbalta 120 mg every day. Her father had history of migraine. She was previous under the care of local neurologist Dr. Silvio Pate who since has left the practice.   MRI of the brain was normal 2009.    She is now taking Topamax 100mg  bid and Riboflavin/Magnesium.  Axert is effective   UPDATE July 14th 2015: She only had 2 migraines in past 1 month, now she is less stressful, while she is on summer medication, Axert continues to help, she also complains of excessive fatigue, sleepiness during the daytime, ESS score is 14, FSS is 59,  She denies snoring, has lost few pounds. But she has excessive daytime sleepiness, and fatigue.  UPDATE Mar 08 2018: Last visit was with Chrys Racer in July 2019, since then multiple phone conversation for increased headache, despite polypharmacy treatment,  Cymbalta 60 mg daily, nortriptyline 20 mg at bedtime, clonazepam as needed, Trintellix 5 mg daily, she continue complains of 15 headache days each month, lateralized severe pounding headache with associated light noise sensitivity, lasting for hours to days,  She is taking Axert as needed, which helps her most of the time, also seeing psychiatrist for anxiety, chronic insomnia,  REVIEW OF SYSTEMS: Full 14 system review of systems performed and notable only for: As above  All rest review of the system were negative  ALLERGIES: Allergies  Allergen Reactions  . Promethazine Other (See Comments)    Tingling sensation  . Pseudoeph-Doxylamine-Dm-Apap Other (See Comments)    Nyquil- Tingling sensation in legs    HOME MEDICATIONS: Outpatient Medications Prior to Visit  Medication Sig Dispense Refill  . almotriptan (AXERT) 12.5 MG tablet Take 1 tablet (12.5 mg total) by mouth as needed for migraine. 15 tablet 6  . clonazePAM (KLONOPIN) 1 MG tablet Take 1 mg by mouth 3 (three) times daily as needed for anxiety.    . DULoxetine (CYMBALTA) 60 MG capsule Take 60 mg daily by mouth.     . Norgestimate-Ethinyl Estradiol Triphasic (TRINESSA LO) 0.18/0.215/0.25 MG-25 MCG tab Take 1 tablet by mouth daily. Takes for 21 days , skip placebo and start next pack.    . nortriptyline (PAMELOR) 10 MG capsule Take 1 capsule (10 mg total) by mouth at bedtime. 1 po at bedtime for 1 week then increase to 2 capsules 60 capsule 3  . ondansetron (ZOFRAN) 4 MG tablet Take 1 tablet (4 mg total) by mouth  every 8 (eight) hours as needed for nausea or vomiting. 20 tablet 1  . tiZANidine (ZANAFLEX) 4 MG tablet TK 1 T PO Q 8 H PRF MUSCLE SPASM    . traMADol (ULTRAM) 50 MG tablet Take 50 mg by mouth 4 (four) times daily as needed for pain.    . TRINTELLIX 5 MG TABS daily.  0  . baclofen (LIORESAL) 10 MG tablet   0  . mirtazapine (REMERON) 45 MG tablet Take 45 mg by mouth at bedtime.  0  . predniSONE (DELTASONE) 10 MG tablet  Take 1 tablet (10 mg total) by mouth daily with breakfast. 6 day dose pack 21 tablet 0  . topiramate (TOPAMAX) 50 MG tablet Take 1 tablet (50 mg total) by mouth 2 (two) times daily. 60 tablet 6  . zaleplon (SONATA) 10 MG capsule 10 mg as needed.  2   No facility-administered medications prior to visit.     PAST MEDICAL HISTORY: Past Medical History:  Diagnosis Date  . Acid reflux   . Anxiety and depression    Dr. Gala Murdoch, MD  . Back pain   . Fibromyalgia   . History of gallstones   . Migraine     PAST SURGICAL HISTORY: Past Surgical History:  Procedure Laterality Date  . CHOLECYSTECTOMY  12/2004    FAMILY HISTORY: Family History  Problem Relation Age of Onset  . Healthy Mother   . Hypertension Father   . Lung cancer Paternal Grandfather   . Colon cancer Neg Hx   . Rectal cancer Neg Hx   . Liver cancer Neg Hx   . Stomach cancer Neg Hx   . Esophageal cancer Neg Hx     SOCIAL HISTORY: Social History   Socioeconomic History  . Marital status: Married    Spouse name: Corene Cornea  . Number of children: 0  . Years of education: MS  . Highest education level: Not on file  Occupational History  . Occupation: Optometrist  Social Needs  . Financial resource strain: Not on file  . Food insecurity:    Worry: Not on file    Inability: Not on file  . Transportation needs:    Medical: Not on file    Non-medical: Not on file  Tobacco Use  . Smoking status: Never Smoker  . Smokeless tobacco: Never Used  Substance and Sexual Activity  . Alcohol use: No  . Drug use: No  . Sexual activity: Not on file  Lifestyle  . Physical activity:    Days per week: Not on file    Minutes per session: Not on file  . Stress: Not on file  Relationships  . Social connections:    Talks on phone: Not on file    Gets together: Not on file    Attends religious service: Not on file    Active member of club or organization: Not on file    Attends meetings of clubs or organizations: Not  on file    Relationship status: Not on file  . Intimate partner violence:    Fear of current or ex partner: Not on file    Emotionally abused: Not on file    Physically abused: Not on file    Forced sexual activity: Not on file  Other Topics Concern  . Not on file  Social History Narrative   Patient lives at home with her spouse.    Caffeine Use: 1-2 sodas daily   PHYSICAL EXAM  Vitals:   03/08/18 1534  BP: 135/90  Pulse: 99  Weight: 189 lb (85.7 kg)  Height: 5\' 6"  (1.676 m)   Body mass index is 30.51 kg/m.  Generalized: Well developed, in no acute distress  Head: normocephalic and atraumatic. Oropharynx benign  Neck: Supple, no carotid bruits  Cardiac: Regular rate rhythm, no murmur  Musculoskeletal: No deformity   Neurological examination  Mentation: Alert oriented to time, place, history taking. Follows all commands speech and language fluent Cranial nerve II-XII: Fundoscopic exam reveals sharp disc margins.Pupils were equal round reactive to light extraocular movements were full, visual field were full on confrontational test. Facial sensation and strength were normal. hearing was intact to finger rubbing bilaterally. Uvula tongue midline. head turning and shoulder shrug and were normal and symmetric.Tongue protrusion into cheek strength was normal. Motor: normal bulk and tone, full strength in the BUE, BLE, fine finger movements normal, no pronator drift. No focal weakness Sensory: normal and symmetric to light touch, pinprick, and  vibration  Coordination: finger-nose-finger, heel-to-shin bilaterally, no dysmetria Reflexes:  Deep tendon reflexes in the upper and lower extremities are present and symmetric.  Gait and Station: Rising up from seated position without assistance, normal stance, without trunk ataxia, moderate stride, good arm swing, smooth turning, able to perform tiptoe, and heel walking without difficulty.    ASSESSMENT AND PLAN 38 y.o. year old  female  Chronic migraine headaches  Continue have 15 headache days each months with polypharmacy treatment,  Previously tried and failed Topamax, riboflavin, magnesium oxide,  Currently on polypharmacy treatment, Cymbalta 60 mg daily, Trintellix 5 mg daily, nortriptyline 10 mg 2 tablets at bedtime  Will add on aimovig 70 mg subcutaneous every month as preventive medication  Axert in combination with Aleve, tizanidine, Zofran as needed for abortive treatment.  Return to clinic with Judson Roch in 3 months  Marcial Pacas, M.D   [facility] [address], [place], [place] [address] [phone]

## 2018-03-08 NOTE — Patient Instructions (Signed)
You can take   Axert With Zofran for nausea Aleve-with anti-inflammatory property Tizanidine as muscle relaxant  As needed during a prolonged severe headaches  Limited use of acute treatment less than 2 times each week  Add aimovig 70 mg subcutaneous injection every month as preventive medications

## 2018-04-12 ENCOUNTER — Telehealth: Payer: Self-pay | Admitting: Neurology

## 2018-04-12 NOTE — Telephone Encounter (Signed)
Patient is calling and states her pharmacy is Needing a [place] ([place]) 17 MG/ML [place]  Telephone [phone].

## 2018-04-13 NOTE — Telephone Encounter (Signed)
Called and LVM for pharmacy letting them know PA approved and provided approval dates.

## 2018-04-13 NOTE — Telephone Encounter (Signed)
PA submitted and approved effective  from 04/13/2018 through 07/11/2018.

## 2018-04-13 NOTE — Telephone Encounter (Signed)
Initiated PA on covermymeds. Key: [id number]. In process of completing.

## 2018-05-17 ENCOUNTER — Telehealth: Payer: Self-pay

## 2018-05-17 NOTE — Telephone Encounter (Signed)
Unable to get in contact with the patient to convert their office visit into a webex visit. I left them a voicemail asking to return my call. Office number was provided.   

## 2018-05-31 ENCOUNTER — Telehealth: Payer: Self-pay

## 2018-05-31 NOTE — Telephone Encounter (Signed)
Received PA request for pt's axert. PA completed via covermymeds. Key: [id number]. Sent to [facility]. Should have a determination in 3-5 business days.

## 2018-06-03 ENCOUNTER — Telehealth: Payer: Self-pay

## 2018-06-03 MED ORDER — NARATRIPTAN HCL 2.5 MG PO TABS: 2.5000 mg | ORAL_TABLET | ORAL | 6 refills | Status: DC | PRN

## 2018-06-03 NOTE — Telephone Encounter (Signed)
Noted! Thank you

## 2018-06-03 NOTE — Telephone Encounter (Signed)
Unable to get in contact with the patient to convert their office visit with Judson Roch on 06/07/2018 into a doxy.me visit. I left a voicemail asking the patient to return my call. Office number was provided.   If patient calls back please convert their office visit into a doxy.me visit.

## 2018-06-03 NOTE — Telephone Encounter (Addendum)
I called the patient about her Axert 12.5mg  tablets.  We received a prior authorization request and it was denied stating she had to try the formulary medications first (sumatiptan, naratriptan and rizatriptan).  She has tried the following:  Treximet, Cambia, sumatriptan and rizatriptan.  She has never tried naratriptan but is willing.  If the naratriptan is not helpful, then she should meet the requirements to go back to Axert.  We will discuss this medication change with Dr. Krista Blue.   Dr. Krista Blue sent in a prescription for naratriptan.

## 2018-06-03 NOTE — Telephone Encounter (Signed)
I wrote Naratriptan 2.5mg  prn. 12 tabs with 6 refills.

## 2018-06-03 NOTE — Telephone Encounter (Signed)
I called the patient about her Axert 12.5mg  tablets.  We received a prior authorization request and it was denied stating she had to try the formulary medications first (sumatiptan, naratriptan and rizatriptan).  She has tried the following:  Treximet, Cambia, sumatriptan and rizatriptan.  She has never tried naratriptan but is willing.  If the naratriptan is not helpful, then she should meet the requirements to go back to Axert.  We will discuss this medication change with Dr. Krista Blue.   Also, she rescheduled her appt for August with Butler Denmark, NP.  She is out of work at this time and unable to afford her $50 co-pay.

## 2018-06-03 NOTE — Addendum Note (Signed)
Addended by: Marcial Pacas on: 06/03/2018 04:00 PM   Modules accepted: Orders

## 2018-06-03 NOTE — Telephone Encounter (Signed)
Pt has called and declined doxy.me vv.  Pt states since the change in her medication she feels fine. Pt states there is no need for the 05-18 appointment and asked that it be cancelled.

## 2018-06-07 ENCOUNTER — Ambulatory Visit: Payer: BLUE CROSS/BLUE SHIELD | Admitting: Neurology

## 2018-06-10 ENCOUNTER — Telehealth: Payer: Self-pay | Admitting: Neurology

## 2018-06-10 DIAGNOSIS — G43009 Migraine without aura, not intractable, without status migrainosus: Secondary | ICD-10-CM

## 2018-06-10 MED ORDER — FREMANEZUMAB-VFRM 225 MG/1.5ML ~~LOC~~ SOSY: 225.0000 mg | PREFILLED_SYRINGE | SUBCUTANEOUS | 11 refills | Status: DC

## 2018-06-10 NOTE — Telephone Encounter (Signed)
I spoke to the patient and she is agreeable to Ajovy.  Her [employer] plan requires a prior authorization and this has been submitted through covermymeds.  Approval is pending and can take up to 72 business hours for the decision.  She is due for her injection now and plans to go ahead and use Aimovig for one more month so that she does not get off schedule.  Covermymeds key: [id number].  Pt's BC[employer] YS#[id number].  Decision pending.

## 2018-06-10 NOTE — Telephone Encounter (Signed)
[employer] requested additional information by fax.  The paperwork has been completed and sent back to them.  Decision pending.

## 2018-06-10 NOTE — Telephone Encounter (Signed)
She has just completed her third injection of Aimovig. Reports the medication is working well to reduce the frequency of her migraines.  However, she is having excruciating pain when the medication is being injected.  She would like to try one of the other CGRP medication to see if her injection site pain will lesson.

## 2018-06-10 NOTE — Telephone Encounter (Signed)
Pt is asking for a call to discuss with RN concerns she has re: taking the Aimovig

## 2018-06-10 NOTE — Addendum Note (Signed)
Addended by: Marcial Pacas on: 06/10/2018 01:30 PM   Modules accepted: Orders

## 2018-06-10 NOTE — Telephone Encounter (Signed)
I ERX ajovy 225mg  sq every month

## 2018-06-15 ENCOUNTER — Encounter: Payer: Self-pay | Admitting: *Deleted

## 2018-06-15 ENCOUNTER — Telehealth: Payer: Self-pay | Admitting: Neurology

## 2018-06-15 MED ORDER — UBROGEPANT 50 MG PO TABS: 50.0000 mg | ORAL_TABLET | ORAL | 11 refills | Status: DC | PRN

## 2018-06-15 MED ORDER — GALCANEZUMAB-GNLM 120 MG/ML ~~LOC~~ SOAJ: SUBCUTANEOUS | 6 refills | Status: DC

## 2018-06-15 NOTE — Telephone Encounter (Signed)
The patient's BCBS plan requires her to try both Amovig and Emgality before they will consider coverage for Ajovy.  She has only failed Aimovig.

## 2018-06-15 NOTE — Telephone Encounter (Signed)
I will initiate a prior authorization for Emgality.

## 2018-06-15 NOTE — Telephone Encounter (Signed)
Dr.Yan do you want to change pt to another medication.

## 2018-06-15 NOTE — Addendum Note (Signed)
Addended by: Marcial Pacas on: 06/15/2018 04:59 PM   Modules accepted: Orders

## 2018-06-15 NOTE — Telephone Encounter (Signed)
I have written Emgality 120mg , 2 consecutive injection first dose, then 120mg  SQ every month

## 2018-06-15 NOTE — Telephone Encounter (Signed)
Please call patient, I have called in   Ubrogepant 50 MG TABS as needed for patient.

## 2018-06-15 NOTE — Addendum Note (Signed)
Addended by: Marcial Pacas on: 06/15/2018 09:58 AM   Modules accepted: Orders

## 2018-06-15 NOTE — Telephone Encounter (Signed)
Pt called stating that the naratriptan (AMERGE) 2.5 MG tablet is not working. She would like to know if her insurance would cover the new med Nurtec. Please advise.

## 2018-06-16 NOTE — Telephone Encounter (Signed)
PA for Emgality started via covermymeds (key: [id number]).  Pt has Alexandra Warren  [phone]).  Decision pending.

## 2018-06-16 NOTE — Telephone Encounter (Signed)
PA for Ubrelvy started via covermymeds (key: [id number]).  Pt has [place]  [phone]).  Decision pending.

## 2018-06-16 NOTE — Telephone Encounter (Signed)
IF pt calls back tell pt that Dr. Krista Blue sent Ubrogepant(ubrelvy) to the pharmacy listed that she wanted to try.  LEft vm for patient that the new medication she wanted was sent 5/26 by Dr. Krista Blue.

## 2018-06-17 ENCOUNTER — Other Ambulatory Visit: Payer: Self-pay | Admitting: *Deleted

## 2018-06-17 MED ORDER — ALMOTRIPTAN MALATE 12.5 MG PO TABS: ORAL_TABLET | ORAL | 11 refills | Status: DC

## 2018-06-17 NOTE — Telephone Encounter (Signed)
Per patient, naratriptan was unhelpful.  She now meets requirements for Axert.  PA for Axert resubmitted through covermymeds.com (Key: [id number]).  Her plan will cover #12/month.  Decision pending.

## 2018-06-17 NOTE — Telephone Encounter (Signed)
Axert approved through 06/15/2021.

## 2018-06-17 NOTE — Telephone Encounter (Signed)
Emgality approved through 09/13/2018.

## 2018-06-17 NOTE — Telephone Encounter (Signed)
Alexandra Warren was denied.  However, she does feel Axert worked well for her migraines and she will meet the approval criteria now.  Per Dr. Krista Blue, ok for her to go back to Axert.  Pt aware.

## 2018-06-17 NOTE — Telephone Encounter (Signed)
PA for Axert resubmitted through covermymeds.com (Key: [id number]).  Her plan will cover #12/month.  Decision pending.

## 2018-07-21 DIAGNOSIS — Z0289 Encounter for other administrative examinations: Secondary | ICD-10-CM

## 2018-09-01 ENCOUNTER — Telehealth: Payer: Self-pay | Admitting: *Deleted

## 2018-09-01 NOTE — Telephone Encounter (Signed)
PA for Emgality started via covermymeds (key: [id number]).  Pt has [place]  [phone]).  XI#[id number].  Decision pending.

## 2018-09-06 NOTE — Telephone Encounter (Signed)
PA approved through 08/31/2019.

## 2018-09-07 NOTE — Progress Notes (Deleted)
PATIENT: Alexandra Warren DOB: 15-Jul-1980  REASON FOR VISIT: follow up HISTORY FROM: patient  HISTORY OF PRESENT ILLNESS: Today 09/07/18  HISTORY  Alexandra Warren is a 38 year-old left-handed Caucasian female referred by her primary care physician PA Delman Cheadle for evaluation of migraine.   She has history of migraines since 2008, approximately age 81 or 89, typical migraine is right parietal retro-orbital area with severe achy pain, sometimes was associated with light sensitivity and noise sensitivity, lasting a couple hours or until able to go to sleep. Responded very well to Axert. Triggers for her migraines are stress, hormonal change, weather change.  She can have up to 20-30 migraines days each month, responding to Axert 75% of the time. A quarter of the time even though she was taking repeated those of Axert, she would suffer prolonged severe protracted migraine that can last a couple of days. She has tried Imitrex in the past which caused neck tightness.   She has been taking Topamax 100 mg every morning, which has been very helpful, previously tried beta blockers and amitriptyline without help.   She also has a history of depression and generalized anxiety taking Cymbalta 120 mg every day. Her father had history of migraine. She was previous under the care of local neurologist Dr. Silvio Pate who since has left the practice.   MRI of the brain was normal 2009.    She is now taking Topamax 100mg  bid and Riboflavin/Magnesium.  Axert is effective   UPDATE July 14th 2015: She only had 2 migraines in past 1 month, now she is less stressful, while she is on summer medication, Axert continues to help, she also complains of excessive fatigue, sleepiness during the daytime, ESS score is 14, FSS is 59,  She denies snoring, has lost few pounds. But she has excessive daytime sleepiness, and fatigue.  UPDATE Mar 08 2018: Last visit was with Chrys Racer in July 2019, since then  multiple phone conversation for increased headache, despite polypharmacy treatment, Cymbalta 60 mg daily, nortriptyline 20 mg at bedtime, clonazepam as needed, Trintellix 5 mg daily, she continue complains of 15 headache days each month, lateralized severe pounding headache with associated light noise sensitivity, lasting for hours to days,  She is taking Axert as needed, which helps her most of the time, also seeing psychiatrist for anxiety, chronic insomnia,  Update September 08, 2018 SS: Chronic migraine headache, insurance coverage for [employer], unable to get coverage for [employer], insurance did not cover Axert  REVIEW OF SYSTEMS: Out of a complete 14 system review of symptoms, the patient complains only of the following symptoms, and all other reviewed systems are negative.  ALLERGIES: Allergies  Allergen Reactions  . Promethazine Other (See Comments)    Tingling sensation  . Pseudoeph-Doxylamine-Dm-Apap Other (See Comments)    Nyquil- Tingling sensation in legs    HOME MEDICATIONS: Outpatient Medications Prior to Visit  Medication Sig Dispense Refill  . almotriptan (AXERT) 12.5 MG tablet Take 1 tab at onset of migraine.  May repeat in 2 hrs, if needed.  Max dose: 2 tabs/day. This is a 30 day prescription. 12 tablet 11  . clonazePAM (KLONOPIN) 1 MG tablet Take 1 mg by mouth 3 (three) times daily as needed for anxiety.    . DULoxetine (CYMBALTA) 60 MG capsule Take 60 mg daily by mouth.     . naratriptan (AMERGE) 2.5 MG tablet Take 1 tablet (2.5 mg total) by mouth as needed for migraine. Take one (1) tablet  at onset of headache; if returns or does not resolve, may repeat after 4 hours; do not exceed five (5) mg in 24 hours. 12 tablet 6  . Norgestimate-Ethinyl Estradiol Triphasic (TRINESSA LO) 0.18/0.215/0.25 MG-25 MCG tab Take 1 tablet by mouth daily. Takes for 21 days , skip placebo and start next pack.    . nortriptyline (PAMELOR) 10 MG capsule Take 1 capsule (10 mg total) by mouth at  bedtime. 1 po at bedtime for 1 week then increase to 2 capsules 60 capsule 3  . ondansetron (ZOFRAN) 4 MG tablet Take 1 tablet (4 mg total) by mouth every 8 (eight) hours as needed for nausea or vomiting. 20 tablet 5  . tiZANidine (ZANAFLEX) 4 MG tablet TK 1 T PO Q 8 H PRF MUSCLE SPASM    . traMADol (ULTRAM) 50 MG tablet Take 50 mg by mouth 4 (four) times daily as needed for pain.    . TRINTELLIX 5 MG TABS daily.  0  . Ubrogepant 50 MG TABS Take 50 mg by mouth as needed. 10 tablet 11   No facility-administered medications prior to visit.     PAST MEDICAL HISTORY: Past Medical History:  Diagnosis Date  . Acid reflux   . Anxiety and depression    Dr. Gala Murdoch, MD  . Back pain   . Fibromyalgia   . History of gallstones   . Migraine     PAST SURGICAL HISTORY: Past Surgical History:  Procedure Laterality Date  . CHOLECYSTECTOMY  12/2004    FAMILY HISTORY: Family History  Problem Relation Age of Onset  . Healthy Mother   . Hypertension Father   . Lung cancer Paternal Grandfather   . Colon cancer Neg Hx   . Rectal cancer Neg Hx   . Liver cancer Neg Hx   . Stomach cancer Neg Hx   . Esophageal cancer Neg Hx     SOCIAL HISTORY: Social History   Socioeconomic History  . Marital status: Married    Spouse name: Corene Cornea  . Number of children: 0  . Years of education: MS  . Highest education level: Not on file  Occupational History  . Occupation: Optometrist  Social Needs  . Financial resource strain: Not on file  . Food insecurity    Worry: Not on file    Inability: Not on file  . Transportation needs    Medical: Not on file    Non-medical: Not on file  Tobacco Use  . Smoking status: Never Smoker  . Smokeless tobacco: Never Used  Substance and Sexual Activity  . Alcohol use: No  . Drug use: No  . Sexual activity: Not on file  Lifestyle  . Physical activity    Days per week: Not on file    Minutes per session: Not on file  . Stress: Not on file   Relationships  . Social Herbalist on phone: Not on file    Gets together: Not on file    Attends religious service: Not on file    Active member of club or organization: Not on file    Attends meetings of clubs or organizations: Not on file    Relationship status: Not on file  . Intimate partner violence    Fear of current or ex partner: Not on file    Emotionally abused: Not on file    Physically abused: Not on file    Forced sexual activity: Not on file  Other Topics Concern  .  Not on file  Social History Narrative   Patient lives at home with her spouse.    Caffeine Use: 1-2 sodas daily      PHYSICAL EXAM  There were no vitals filed for this visit. There is no height or weight on file to calculate BMI.  Generalized: Well developed, in no acute distress   Neurological examination  Mentation: Alert oriented to time, place, history taking. Follows all commands speech and language fluent Cranial nerve II-XII: Pupils were equal round reactive to light. Extraocular movements were full, visual field were full on confrontational test. Facial sensation and strength were normal. Uvula tongue midline. Head turning and shoulder shrug  were normal and symmetric. Motor: The motor testing reveals 5 over 5 strength of all 4 extremities. Good symmetric motor tone is noted throughout.  Sensory: Sensory testing is intact to soft touch on all 4 extremities. No evidence of extinction is noted.  Coordination: Cerebellar testing reveals good finger-nose-finger and heel-to-shin bilaterally.  Gait and station: Gait is normal. Tandem gait is normal. Romberg is negative. No drift is seen.  Reflexes: Deep tendon reflexes are symmetric and normal bilaterally.   DIAGNOSTIC DATA (LABS, IMAGING, TESTING) - I reviewed patient records, labs, notes, testing and imaging myself where available.  No results found for: WBC, HGB, HCT, MCV, PLT No results found for: NA, K, CL, CO2, GLUCOSE, BUN,  CREATININE, CALCIUM, PROT, ALBUMIN, AST, ALT, ALKPHOS, BILITOT, GFRNONAA, GFRAA No results found for: CHOL, HDL, LDLCALC, LDLDIRECT, TRIG, CHOLHDL No results found for: HGBA1C No results found for: VITAMINB12 No results found for: TSH    ASSESSMENT AND PLAN 38 y.o. year old female  has a past medical history of Acid reflux, Anxiety and depression, Back pain, Fibromyalgia, History of gallstones, and Migraine. here with:  1.  Chronic migraine headache -She has tried Topamax, beta-blocker, amitriptyline, Cymbalta, riboflavin/magnesium -Insurance prefers Emgality as CGRP   I spent 15 minutes with the patient. 50% of this time was spent   Butler Denmark, [place], DNP 09/07/2018, 8:12 PM [facility] [address], [place], [place] [address] [phone]

## 2018-09-08 ENCOUNTER — Ambulatory Visit: Payer: Self-pay | Admitting: Neurology

## 2018-09-08 ENCOUNTER — Telehealth: Payer: Self-pay | Admitting: Neurology

## 2018-09-08 NOTE — Telephone Encounter (Signed)
Alexandra Warren came in today, she was late for her appointment due to not being to get off of work on time. We scheduled a MYCHART virtual visit for next time so it's a lot more convenient for her. Patient states that when injecting Emgality, the pain is immense. She screams in pain and states on a scale of 1-10 she would say the pain is a solid 8. She is wondering what can be done about this? She gets her boyfriend to inject and states he puts no more pressure than needed. Alexandra Warren is wondering why this continues to happen. She has requested to be called my nurse or NP to discuss further options. She also states she is available for [facility] messages to discuss this issue.

## 2018-09-09 ENCOUNTER — Encounter: Payer: Self-pay | Admitting: Neurology

## 2018-09-09 ENCOUNTER — Other Ambulatory Visit: Payer: Self-pay | Admitting: *Deleted

## 2018-09-09 MED ORDER — EMGALITY 120 MG/ML ~~LOC~~ SOAJ: 120.0000 mg | SUBCUTANEOUS | 5 refills | Status: AC

## 2018-09-09 NOTE — Telephone Encounter (Signed)
LMVM for pt to return call.   

## 2018-09-09 NOTE — Telephone Encounter (Signed)
Please call the patient.  It looks as though she had a similar complaints while injecting Aimovig back in May 2020, out of caution she was switched to [employer].  I have not heard of this kind of intense pain when injecting CGRP medications.  She can either,   1.Stop the medication and be off CGRP all together if the pain is too intense. We could try to switch to Ajovy if her insurance would approve.  2.  Could come into the office to have nurses visit to review her technique to see if she is injecting correctly.   She has been on Emgality since May 2020. If the medication is helpful for her headaches, I suggest starting with a nurses visit to review her technique.

## 2018-09-09 NOTE — Telephone Encounter (Signed)
Pt returned call.  She has been taking the emgality (boyfriend gives) in thigh. It is at room temperature, she had alternated legs.  Have watched videos, read instructions.  Has been aware of pressure of pen on skin.  She states that she is ok with needle going in but the medication itself (2 sec after going in, she can feel blinding pain.  The bump will go away after 2 days.   She has not used other sites.  I told her that would like to see if other sites cause same reaction, and if so, other CGRP option is ajovy (which is a syringe, not autoinjector).  She states that the emgality does help with her migraines.  She will call me and let me know if she will come in today for injection.
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
## 2018-10-18 ENCOUNTER — Telehealth: Payer: Self-pay | Admitting: Neurology

## 2018-10-18 ENCOUNTER — Telehealth (INDEPENDENT_AMBULATORY_CARE_PROVIDER_SITE_OTHER): Payer: BC Managed Care – PPO | Admitting: Neurology

## 2018-10-18 ENCOUNTER — Encounter: Payer: Self-pay | Admitting: Neurology

## 2018-10-18 DIAGNOSIS — G43009 Migraine without aura, not intractable, without status migrainosus: Secondary | ICD-10-CM | POA: Diagnosis not present

## 2018-10-18 MED ORDER — EMGALITY 120 MG/ML ~~LOC~~ SOAJ: 120.0000 mg | SUBCUTANEOUS | 5 refills | Status: DC

## 2018-10-18 MED ORDER — UBROGEPANT 50 MG PO TABS: 50.0000 mg | ORAL_TABLET | ORAL | 11 refills | Status: DC | PRN

## 2018-10-18 NOTE — Telephone Encounter (Signed)
I called the patient. She did not log on for her my chart visit scheduled for 10/18/2018 at 3:45. I left a voicemail.

## 2018-10-20 NOTE — Progress Notes (Signed)
I have reviewed and agreed above plan. 

## 2018-11-09 ENCOUNTER — Encounter: Payer: Self-pay | Admitting: Neurology

## 2018-11-11 ENCOUNTER — Encounter: Payer: Self-pay | Admitting: Neurology

## 2018-11-11 ENCOUNTER — Telehealth: Payer: Self-pay | Admitting: Neurology

## 2018-11-11 MED ORDER — PREDNISONE 5 MG PO TABS: ORAL_TABLET | ORAL | 0 refills | Status: DC

## 2018-11-11 NOTE — Telephone Encounter (Signed)
I called the patient.  She reports she has had a 3-day prolonged headache, with nausea and vomiting.  In general Emgality has been working well for the headache, but she has just run into a bad cycle of prolonged headache, likely due to increased stress and weather change.  She has been taking Axert and ubrelvy without benefit.  I was going to send for IV infusion, but there is Producer, television/film/video of [place].  I will treat her with a prednisone taper pack.  If she is not better next week, she will contact our office.

## 2018-11-24 ENCOUNTER — Telehealth: Payer: Self-pay | Admitting: Neurology

## 2018-11-24 NOTE — Telephone Encounter (Signed)
[id number] Cover My Meds is asking for a call from RN to discuss pt's Alexandra Warren, she provided the Refrence [id number] please call

## 2018-11-25 NOTE — Telephone Encounter (Signed)
I called CMM and will close out Ubrelvy for now as pt on axert.  If needed again will reapply.

## 2018-11-26 ENCOUNTER — Emergency Department
Admission: EM | Admit: 2018-11-26 | Discharge: 2018-11-27 | Disposition: A | Discharge status: Home / Self Care | Payer: 59 | Attending: Emergency Medicine | Admitting: Emergency Medicine

## 2018-11-26 ENCOUNTER — Encounter: Payer: Self-pay | Admitting: Emergency Medicine

## 2018-11-26 ENCOUNTER — Other Ambulatory Visit: Payer: Self-pay

## 2018-11-26 DIAGNOSIS — R519 Headache, unspecified: Secondary | ICD-10-CM | POA: Insufficient documentation

## 2018-11-26 DIAGNOSIS — Z5321 Procedure and treatment not carried out due to patient leaving prior to being seen by health care provider: Secondary | ICD-10-CM | POA: Insufficient documentation

## 2018-11-26 NOTE — ED Triage Notes (Signed)
Patient states that she has had a migraine times 6 weeks. Patient states that she has been to the neurologist multiple times but the pain has not improved. Patient states that she has also had nausea and vomiting. Patient states that the neurologist recommended her coming to the ED.

## 2018-11-27 ENCOUNTER — Encounter (HOSPITAL_COMMUNITY): Payer: Self-pay | Admitting: Urgent Care

## 2018-11-27 ENCOUNTER — Telehealth: Payer: Self-pay | Admitting: Diagnostic Neuroimaging

## 2018-11-27 ENCOUNTER — Ambulatory Visit (HOSPITAL_COMMUNITY)
Admission: EM | Admit: 2018-11-27 | Discharge: 2018-11-27 | Disposition: A | Discharge status: Home / Self Care | Payer: BC Managed Care – PPO

## 2018-11-27 DIAGNOSIS — R519 Headache, unspecified: Secondary | ICD-10-CM

## 2018-11-27 DIAGNOSIS — G43809 Other migraine, not intractable, without status migrainosus: Secondary | ICD-10-CM | POA: Diagnosis not present

## 2018-11-27 DIAGNOSIS — H53149 Visual discomfort, unspecified: Secondary | ICD-10-CM | POA: Diagnosis not present

## 2018-11-27 MED ORDER — BUTALBITAL-APAP-CAFFEINE 50-325-40 MG PO TABS: 1.0000 | ORAL_TABLET | Freq: Three times a day (TID) | ORAL | 0 refills | Status: DC | PRN

## 2018-11-27 MED ORDER — KETOROLAC TROMETHAMINE 60 MG/2ML IM SOLN
INTRAMUSCULAR | Status: AC
  Filled 2018-11-27: qty 2

## 2018-11-27 MED ORDER — ONDANSETRON HCL 8 MG PO TABS: 8.0000 mg | ORAL_TABLET | Freq: Three times a day (TID) | ORAL | 3 refills | Status: DC | PRN

## 2018-11-27 MED ORDER — TOPIRAMATE 100 MG PO TABS: 100.0000 mg | ORAL_TABLET | Freq: Two times a day (BID) | ORAL | 3 refills | Status: DC

## 2018-11-27 MED ORDER — KETOROLAC TROMETHAMINE 60 MG/2ML IM SOLN
60.0000 mg | Freq: Once | INTRAMUSCULAR | Status: AC
  Administered 2018-11-27: 60 mg via INTRAMUSCULAR

## 2018-11-27 NOTE — ED Provider Notes (Signed)
MRN: [id number] DOB: 12/29/1980  Subjective:   Alexandra Warren is a 38 y.o. female presenting for 6-week history of chronic migraine over left side of face around her left eye, forehead.  Patient has had associated nausea, vomiting, photophobia and general malaise.  She has been working with her neurology practice very closely and has been on multiple migraine medications including sumatriptan, amitriptyline, prednisone, Topamax, also using supportive medications such as Zofran.  Patient did get a prescription for Fioricet but was advised by her neurologist to go to the emergency room and obtain IV medications for aborting a migraine.  Patient did so and waited in the ER at [facility] for 6 hours without being seen.  She states that she could no longer tolerate being there due to the photosensitivity and noise that was worsening her headache.  Therefore, she came to our clinic today.  No current facility-administered medications for this encounter.   Current Outpatient Medications:  .  almotriptan (AXERT) 12.5 MG tablet, Take 1 tab at onset of migraine.  May repeat in 2 hrs, if needed.  Max dose: 2 tabs/day. This is a 30 day prescription., Disp: 12 tablet, Rfl: 11 .  butalbital-acetaminophen-caffeine (FIORICET) 50-325-40 MG tablet, Take 1 tablet by mouth every 8 (eight) hours as needed for headache., Disp: 10 tablet, Rfl: 0 .  clonazePAM (KLONOPIN) 1 MG tablet, Take 1 mg by mouth 3 (three) times daily as needed for anxiety., Disp: , Rfl:  .  Galcanezumab-gnlm (EMGALITY) 120 MG/ML SOAJ, Inject 120 mg into the skin every 30 (thirty) days., Disp: 1 pen, Rfl: 5 .  Norgestimate-Ethinyl Estradiol Triphasic (TRINESSA LO) 0.18/0.215/0.25 MG-25 MCG tab, Take 1 tablet by mouth daily. Takes for 21 days , skip placebo and start next pack., Disp: , Rfl:  .  ondansetron (ZOFRAN) 8 MG tablet, Take 1 tablet (8 mg total) by mouth every 8 (eight) hours as needed for nausea or vomiting., Disp: 30 tablet, Rfl: 3 .   predniSONE (DELTASONE) 5 MG tablet, Take 6 on day 1, then taper by 1 daily until off the medication, Disp: 21 tablet, Rfl: 0 .  tiZANidine (ZANAFLEX) 4 MG tablet, TK 1 T PO Q 8 H PRF MUSCLE SPASM, Disp: , Rfl:  .  topiramate (TOPAMAX) 100 MG tablet, Take 1 tablet (100 mg total) by mouth 2 (two) times daily., Disp: 60 tablet, Rfl: 3 .  traMADol (ULTRAM) 50 MG tablet, Take 50 mg by mouth 4 (four) times daily as needed for pain., Disp: , Rfl:  .  TRINTELLIX 5 MG TABS, daily., Disp: , Rfl: 0 .  Ubrogepant 50 MG TABS, Take 50 mg by mouth as needed., Disp: 10 tablet, Rfl: 11   Allergies  Allergen Reactions  . Promethazine Other (See Comments)    Tingling sensation  . Pseudoeph-Doxylamine-Dm-Apap Other (See Comments)    Nyquil- Tingling sensation in legs    Past Medical History:  Diagnosis Date  . Acid reflux   . Anxiety and depression    Dr. Gala Murdoch, MD  . Back pain   . Fibromyalgia   . History of gallstones   . Migraine      Past Surgical History:  Procedure Laterality Date  . CHOLECYSTECTOMY  12/2004    ROS  Objective:   Vitals: BP 124/86 (BP Location: Left Arm)   Pulse 96   Temp 98.9 F (37.2 C) (Oral)   Resp 17   SpO2 97%   Physical Exam Constitutional:      General: She is  not in acute distress.    Appearance: Normal appearance. She is well-developed. She is not ill-appearing.  HENT:     Head: Normocephalic and atraumatic.     Nose: Nose normal.     Mouth/Throat:     Mouth: Mucous membranes are moist.     Pharynx: Oropharynx is clear.  Eyes:     General: No scleral icterus.    Extraocular Movements: Extraocular movements intact.     Pupils: Pupils are equal, round, and reactive to light.  Cardiovascular:     Rate and Rhythm: Normal rate.  Pulmonary:     Effort: Pulmonary effort is normal.  Skin:    General: Skin is warm and dry.  Neurological:     General: No focal deficit present.     Mental Status: She is alert and oriented to person, place,  and time.     Cranial Nerves: No cranial nerve deficit.     Motor: No weakness.     Coordination: Coordination normal.     Gait: Gait normal.     Deep Tendon Reflexes: Reflexes normal.  Psychiatric:        Mood and Affect: Mood normal.        Behavior: Behavior normal.        Thought Content: Thought content normal.        Judgment: Judgment normal.      Assessment and Plan :   1. Other migraine without status migrainosus, not intractable   2. Bad headache   3. Photophobia     Patient was agreeable to trying IM Toradol in clinic.  Recommended she try Fioricet as prescribed to her by her neurologist.  Unfortunately patient attempted to go to the emergency room and was not seen after waiting for 6 hours.  I do not see need for patient to do this again.  We will have her follow-up with her neurologist on Monday. Counseled patient on potential for adverse effects with medications prescribed/recommended today, ER and return-to-clinic precautions discussed, patient verbalized understanding.    Jaynee Eagles, PA-C 11/27/18 1627

## 2018-11-27 NOTE — ED Notes (Signed)
Pt called 3 times in the lobby no answer.

## 2018-11-27 NOTE — Telephone Encounter (Signed)
Error

## 2018-11-27 NOTE — ED Triage Notes (Signed)
Pt states having migraine x 6 weeks on and off. Pt states having nausea, vomiting (1 at day), Pt states she has been treating by neurologist with Prednisone 2 weeks ago, without improvement of her symptoms.

## 2018-11-27 NOTE — Telephone Encounter (Signed)
Having more HA for past few days. Tried ER visit last night, but left due to long wait and photophobia /phonophobia.  Will try short term remedy of meds.  Meds ordered this encounter  Medications  . topiramate (TOPAMAX) 100 MG tablet    Sig: Take 1 tablet (100 mg total) by mouth 2 (two) times daily.    Dispense:  60 tablet    Refill:  3  . butalbital-acetaminophen-caffeine (FIORICET) 50-325-40 MG tablet    Sig: Take 1 tablet by mouth every 8 (eight) hours as needed for headache.    Dispense:  10 tablet    Refill:  0  . ondansetron (ZOFRAN) 8 MG tablet    Sig: Take 1 tablet (8 mg total) by mouth every 8 (eight) hours as needed for nausea or vomiting.    Dispense:  30 tablet    Refill:  [place], MD [id number], [id number] AM Certified in Neurology, Neurophysiology and Neuroimaging  [facility] [address], [place], [place] [address] [phone]

## 2018-11-29 ENCOUNTER — Encounter: Payer: Self-pay | Admitting: Neurology

## 2018-11-29 ENCOUNTER — Telehealth: Payer: Self-pay | Admitting: Neurology

## 2018-11-29 NOTE — Telephone Encounter (Signed)
I spoke to pt.  Relayed that orders in place. Intrafusion as opening at 1400.  Pt is not pregnant.  Orders given to intrafusion.  She may or maynot have driver, this would dictate what medications she is given.  She verbalized understanding.

## 2018-11-29 NOTE — Telephone Encounter (Signed)
Please call the patient. She sent my chart message asking for IV infusion.  It looks like she received IV infusion in August 2019.  Please ensure she is not pregnant.  Depacon 1000 mg IV, Toradol 30 mg IV, Compazine 10 mg IV.  Ensure she has a driver.  She has an allergy to Phenergan.  Infusion suite reports she should arrive by 2 for infusion.

## 2018-11-29 NOTE — Telephone Encounter (Signed)
On schedule foe Dec 04 2018

## 2019-01-25 ENCOUNTER — Telehealth: Payer: Self-pay

## 2019-01-25 NOTE — Telephone Encounter (Signed)
Patient left a voicemail stating that she needs to r/s her appt with Judson Roch.

## 2019-02-03 ENCOUNTER — Ambulatory Visit: Payer: BC Managed Care – PPO | Admitting: Neurology

## 2019-03-08 ENCOUNTER — Telehealth: Payer: Self-pay

## 2019-03-08 ENCOUNTER — Encounter: Payer: Self-pay | Admitting: Neurology

## 2019-03-08 NOTE — Telephone Encounter (Signed)
Received approval fax, will fax to pharmacy

## 2019-03-08 NOTE — Telephone Encounter (Addendum)
Pt states she has new insurance: [place] Caller Bin: [id number] PCN: [id number] Group: [id number] SubscriberID: [id number]  PA initiated via insurance (over the phone) [phone]  For Emgality 120mg /mL Auto injector.  Directions: Administer 1 mL under the skin every 30 days.   Medication approved  Feb 15,2021-May 16,2021  Approval # [id number]  Awaiting approval fax.

## 2019-04-04 ENCOUNTER — Encounter: Payer: Self-pay | Admitting: Neurology

## 2019-04-05 ENCOUNTER — Other Ambulatory Visit: Payer: Self-pay | Admitting: *Deleted

## 2019-04-05 MED ORDER — TOPIRAMATE 100 MG PO TABS: 100.0000 mg | ORAL_TABLET | Freq: Two times a day (BID) | ORAL | 0 refills | Status: DC
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
## 2019-04-21 ENCOUNTER — Ambulatory Visit: Payer: PRIVATE HEALTH INSURANCE | Admitting: Neurology

## 2019-04-21 ENCOUNTER — Other Ambulatory Visit: Payer: Self-pay

## 2019-04-21 ENCOUNTER — Encounter: Payer: Self-pay | Admitting: Neurology

## 2019-04-21 VITALS — BP 125/90 | HR 110 | Temp 97.6°F | Ht 66.0 in | Wt 183.0 lb

## 2019-04-21 DIAGNOSIS — G43009 Migraine without aura, not intractable, without status migrainosus: Secondary | ICD-10-CM

## 2019-04-21 MED ORDER — EMGALITY 120 MG/ML ~~LOC~~ SOAJ: 120.0000 mg | SUBCUTANEOUS | 11 refills | Status: DC

## 2019-04-21 MED ORDER — NURTEC 75 MG PO TBDP: 75.0000 mg | ORAL_TABLET | ORAL | 11 refills | Status: DC | PRN

## 2019-04-21 MED ORDER — ALMOTRIPTAN MALATE 12.5 MG PO TABS: ORAL_TABLET | ORAL | 11 refills | Status: DC

## 2019-04-21 MED ORDER — TOPIRAMATE 100 MG PO TABS: 100.0000 mg | ORAL_TABLET | Freq: Two times a day (BID) | ORAL | 11 refills | Status: DC

## 2019-04-21 MED ORDER — ONDANSETRON HCL 8 MG PO TABS: 8.0000 mg | ORAL_TABLET | Freq: Three times a day (TID) | ORAL | 3 refills | Status: DC | PRN

## 2019-04-21 MED ORDER — BUTALBITAL-APAP-CAFFEINE 50-325-40 MG PO TABS: 1.0000 | ORAL_TABLET | Freq: Three times a day (TID) | ORAL | 0 refills | Status: DC | PRN

## 2019-04-21 NOTE — Patient Instructions (Signed)
Our plan: Continue Topamax, Emgality Stop Alexandra Warren, try Nurtec for acute headache  Continue the Axert as needed I filled Fioricet for acute headache only in severe situations  Keep track of headaches with app

## 2019-04-23 ENCOUNTER — Ambulatory Visit: Payer: Self-pay | Attending: Internal Medicine

## 2019-04-23 DIAGNOSIS — Z23 Encounter for immunization: Secondary | ICD-10-CM

## 2019-04-23 NOTE — Progress Notes (Signed)
   Covid-19 Vaccination Clinic  Name:  Alexandra Warren    MRN: [id number] DOB: [birth year]  04/23/2019  [name] was observed post Covid-19 immunization for 15 minutes without incident. She was provided with Vaccine Information Sheet and instruction to access the V-Safe system.   [name] was instructed to call 911 with any severe reactions post vaccine: Marland Kitchen Difficulty breathing  . Swelling of face and throat  . A fast heartbeat  . A bad rash all over body  . Dizziness and weakness   Immunizations Administered    Name Date Dose VIS Date Route   Pfizer COVID-19 Vaccine 04/23/2019  2:22 PM 0.3 mL 12/31/2018 Intramuscular   Manufacturer: [place]   Lot: [id number]   [place]: [id number]

## 2019-04-26 ENCOUNTER — Telehealth: Payer: Self-pay

## 2019-04-26 NOTE — Telephone Encounter (Signed)
PA for nurtec was completed, fax to [facility] as requested. Regardless of PA outcome, pt was provided a nurtec copay card to use at the pharmacy.

## 2019-05-18 ENCOUNTER — Ambulatory Visit: Payer: Self-pay | Attending: Internal Medicine

## 2019-05-18 DIAGNOSIS — Z23 Encounter for immunization: Secondary | ICD-10-CM

## 2019-05-18 NOTE — Progress Notes (Signed)
   Covid-19 Vaccination Clinic  Name:  Alexandra Warren    MRN: [id number] DOB: [birth year]  05/18/2019  [name] was observed post Covid-19 immunization for 15 minutes without incident. She was provided with Vaccine Information Sheet and instruction to access the V-Safe system.   [name] was instructed to call 911 with any severe reactions post vaccine: Marland Kitchen Difficulty breathing  . Swelling of face and throat  . A fast heartbeat  . A bad rash all over body  . Dizziness and weakness   Immunizations Administered    Name Date Dose VIS Date Route   Pfizer COVID-19 Vaccine 05/18/2019  3:49 PM 0.3 mL 03/16/2018 Intramuscular   Manufacturer: [place]   Lot: [id number]   [place]: [id number]

## 2019-05-31 NOTE — Progress Notes (Signed)
I have reviewed and agreed above plan. 

## 2019-06-17 ENCOUNTER — Encounter: Payer: Self-pay | Admitting: Neurology

## 2019-06-23 ENCOUNTER — Encounter: Payer: Self-pay | Admitting: *Deleted

## 2019-06-23 ENCOUNTER — Telehealth: Payer: Self-pay | Admitting: *Deleted

## 2019-06-23 NOTE — Telephone Encounter (Signed)
Emgality PA, key: [id number], G43.009, failed: Aimovig, propranolol, nortriptyline, naratriptyline, Ubrelvy, Cymbalta, Trintellix. Received immediate approval. Approval notice faxed to [place], Tyler Deis. Sent her my chart to advise.

## 2019-06-23 NOTE — Telephone Encounter (Signed)
Received denial as NURTEC plan exclusion.  Can do appeal if want to try, but otherwise trying something else?

## 2019-06-27 ENCOUNTER — Encounter: Payer: Self-pay | Admitting: Neurology

## 2019-06-28 MED ORDER — NURTEC 75 MG PO TBDP: 75.0000 mg | ORAL_TABLET | ORAL | 11 refills | Status: DC | PRN

## 2019-06-28 NOTE — Telephone Encounter (Signed)
Spoke to [place] at [place], faxed to [facility] prescription and denial for pt to [place] to commercial for nurtec (porgram till 01-20-2020. Received fax confirmation.

## 2019-06-29 ENCOUNTER — Encounter: Payer: Self-pay | Admitting: *Deleted

## 2019-09-13 ENCOUNTER — Other Ambulatory Visit: Payer: Self-pay | Admitting: *Deleted

## 2019-09-13 MED ORDER — BUTALBITAL-APAP-CAFFEINE 50-325-40 MG PO TABS: 1.0000 | ORAL_TABLET | Freq: Three times a day (TID) | ORAL | 0 refills | Status: DC | PRN

## 2019-09-14 ENCOUNTER — Encounter: Payer: Self-pay | Admitting: Neurology

## 2019-09-20 ENCOUNTER — Telehealth: Payer: Self-pay | Admitting: Neurology

## 2019-09-20 ENCOUNTER — Encounter: Payer: Self-pay | Admitting: Neurology

## 2019-09-20 NOTE — Telephone Encounter (Signed)
Alexandra Warren, I am taking Emgality and Topamax but had an increase in my headaches up to 15 a month.  I would be interested in an infusion so I could work easier.  These past few weeks have been really hard.   Thank you,  MyChart message from patient, can we try to get her in this week for IV infusion, if driver, can have Depacon 1000 mg IV, Toradol 30 mg IV, Compazine 10 mg IV, she has allergy to Phenergan.  Previously received this in November 2020.  Will also route this to [facility], see if we can start the process to get her set up for Botox therapy.  At this point, reporting 15 headache days a month.  Currently taking Emgality, Topamax, Axert.  Has previously tried [employer], Ubrelvy, Fioricet, propanolol, nortriptyline, Cymbalta, Trintellix

## 2019-09-20 NOTE — Telephone Encounter (Signed)
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 Orders given to Liane, Rn in intrafusion.  Sarah NP to sign orders.

## 2019-09-21 NOTE — Telephone Encounter (Signed)
Notes from Sarah:  Will also route this to [facility], see if we can start the process to get her set up for Botox therapy.  At this point, reporting 15 headache days a month.  Currently taking Emgality, Topamax, Axert.  Has previously tried [employer], Ubrelvy, Fioricet, propanolol, nortriptyline, Cymbalta, Trintellix  In progress.

## 2019-09-27 ENCOUNTER — Encounter: Payer: Self-pay | Admitting: Neurology

## 2019-09-29 ENCOUNTER — Encounter: Payer: Self-pay | Admitting: Neurology

## 2019-10-03 ENCOUNTER — Telehealth: Payer: Self-pay | Admitting: Neurology

## 2019-10-03 NOTE — Telephone Encounter (Signed)
I called patient's insurance, [employer], and spoke with Vaughan Basta to see if PA is needed for [id number] and [phone]. She states no PA is required from [employer]. She asked me if the J code was a high-dollar drug and I told her it is for Botox. She states I should speak with benefits and eligibility to see if I needed to use [facility], their preferred pharmacy vendor. Reference # is Michaelene Song and today's date. She transferred me over and I spoke with [place], who confirmed I would need to call [facility]. Reference #[id number]. I called [facility] [phone]) and spoke with [place]. She states PA is required for [id number]. We began the PA process. She states the pharmacist will reach out to me if any more information is needed.

## 2019-10-03 NOTE — Telephone Encounter (Signed)
Alexandra Warren may not cover Emgality and Botox. Patient will most likely need to stop Emgality in order to get Botox approved.

## 2019-10-03 NOTE — Telephone Encounter (Signed)
Judson Roch, it would be fine for you to use samples

## 2019-10-05 NOTE — Telephone Encounter (Signed)
I called [facility] and spoke with Izora Gala to check the status of the PA because patient had inquired. Izora Gala states that it is still in process. She states that it should be finished in the next few days.

## 2019-10-10 NOTE — Telephone Encounter (Addendum)
Received PA approval via fax. PA #[id number] (10/03/19- 04/05/20)  Specialty Pharmacy is [facility] [phone])  I called [facility] and spoke with Liliane Channel to set up a profile for patient. I gave him patient's demographic information as well as prescription for patient.

## 2019-10-17 NOTE — Telephone Encounter (Signed)
I called [facility] and scheduled Botox delivery for 10/20/19.

## 2019-10-20 NOTE — Telephone Encounter (Signed)
Received (1) 200U vial of Botox today from [facility].   I called the patient to set up her first Botox injection. I offered her a few dates and she was not able to take any of them due to work. She advised me to call back after October 8th, because things will begin to slow down for her at work.

## 2019-10-27 ENCOUNTER — Ambulatory Visit: Payer: PRIVATE HEALTH INSURANCE | Admitting: Neurology

## 2019-11-14 ENCOUNTER — Ambulatory Visit: Payer: PRIVATE HEALTH INSURANCE | Admitting: Neurology

## 2019-11-14 DIAGNOSIS — G43009 Migraine without aura, not intractable, without status migrainosus: Secondary | ICD-10-CM | POA: Diagnosis not present

## 2019-11-14 NOTE — Procedures (Signed)
     BOTOX PROCEDURE NOTE FOR MIGRAINE HEADACHE   HISTORY: Alexandra Warren is a 39 year old female with history of chronic migraine headaches.  She presents today for her first Botox injection for chronic migraines.  She reports at least 15 headache days a month.Currently taking Emgality, Topamax, Axert.  Has previously tried [employer], Ubrelvy, Fioricet, propanolol, nortriptyline, Cymbalta, Trintellix.  She sometimes has to miss work for headaches.  Her headaches can be quite debilitating, really affect her quality of life.   Description of procedure:  The patient was placed in a sitting position (the frontalis, procerus, corrugator, temporalis injections were done in the lying position). The standard protocol was used for Botox as follows, with 5 units of Botox injected at each site:   -Procerus muscle, midline injection  -Corrugator muscle, bilateral injection  -Frontalis muscle, bilateral injection, with 2 sites each side, medial injection was performed in the upper one third of the frontalis muscle, in the region vertical from the medial inferior edge of the superior orbital rim. The lateral injection was again in the upper one third of the forehead vertically above the lateral limbus of the cornea, 1.5 cm lateral to the medial injection site.  -Temporalis muscle injection, 4 sites, bilaterally. The first injection was 3 cm above the tragus of the ear, second injection site was 1.5 cm to 3 cm up from the first injection site in line with the tragus of the ear. The third injection site was 1.5-3 cm forward between the first 2 injection sites. The fourth injection site was 1.5 cm posterior to the second injection site.  -Occipitalis muscle injection, 3 sites, bilaterally. The first injection was done one half way between the occipital protuberance and the tip of the mastoid process behind the ear. The second injection site was done lateral and superior to the first, 1 fingerbreadth from the first  injection. The third injection site was 1 fingerbreadth superiorly and medially from the first injection site.  -Cervical paraspinal muscle injection, 2 sites, bilateral, the first injection site was 1 cm from the midline of the cervical spine, 3 cm inferior to the lower border of the occipital protuberance. The second injection site was 1.5 cm superiorly and laterally to the first injection site.  -Trapezius muscle injection was performed at 3 sites, bilaterally. The first injection site was in the upper trapezius muscle halfway between the inflection point of the neck, and the acromion. The second injection site was one half way between the acromion and the first injection site. The third injection was done between the first injection site and the inflection point of the neck.   A 200 unit bottle of Botox was used, 155 units were injected, the rest of the Botox was wasted. The patient tolerated the procedure well, there were no complications of the above procedure.  Botox NDC 2620-3559-74 Lot number [id number] Expiration date 05/2022 S/P

## 2019-11-14 NOTE — Progress Notes (Signed)
Botox- 200 units x 1 vial Lot: [id number] Expiration: 05/2022 NDC: 9563-8756-43  Bacteriostatic 0.9% Sodium Chloride- 36mL total Lot: [id number] Expiration: 02/23 NDC: 4166-0630-16  Dx: W10.932 S/P  New Botox patient consent form was given and signed today.

## 2019-12-05 ENCOUNTER — Other Ambulatory Visit: Payer: Self-pay

## 2019-12-05 ENCOUNTER — Ambulatory Visit: Payer: PRIVATE HEALTH INSURANCE | Admitting: Neurology

## 2019-12-05 ENCOUNTER — Encounter: Payer: Self-pay | Admitting: Neurology

## 2019-12-05 VITALS — BP 122/74 | Ht 66.0 in | Wt 177.4 lb

## 2019-12-05 DIAGNOSIS — G43009 Migraine without aura, not intractable, without status migrainosus: Secondary | ICD-10-CM

## 2019-12-05 MED ORDER — ONDANSETRON HCL 8 MG PO TABS: 8.0000 mg | ORAL_TABLET | Freq: Three times a day (TID) | ORAL | 3 refills | Status: DC | PRN

## 2019-12-05 MED ORDER — BUTALBITAL-APAP-CAFFEINE 50-325-40 MG PO TABS: 1.0000 | ORAL_TABLET | Freq: Three times a day (TID) | ORAL | 3 refills | Status: DC | PRN

## 2019-12-05 MED ORDER — ALMOTRIPTAN MALATE 12.5 MG PO TABS: ORAL_TABLET | ORAL | 11 refills | Status: DC

## 2019-12-05 MED ORDER — EMGALITY 120 MG/ML ~~LOC~~ SOAJ: 120.0000 mg | SUBCUTANEOUS | 11 refills | Status: DC

## 2019-12-05 NOTE — Patient Instructions (Signed)
Continue current medication Try to cut on Topamax to 150 mg, decrease by 50 mg every few weeks, watch for worsening headaches See you back in 1 year
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
## 2019-12-12 NOTE — Progress Notes (Signed)
I have reviewed and agreed above plan. 

## 2020-01-06 ENCOUNTER — Encounter: Payer: Self-pay | Admitting: Neurology

## 2020-01-24 ENCOUNTER — Emergency Department: Payer: PRIVATE HEALTH INSURANCE

## 2020-01-24 ENCOUNTER — Other Ambulatory Visit: Payer: Self-pay

## 2020-01-24 ENCOUNTER — Emergency Department
Admission: EM | Admit: 2020-01-24 | Discharge: 2020-01-25 | Disposition: A | Discharge status: Home / Self Care | Payer: PRIVATE HEALTH INSURANCE | Attending: Emergency Medicine | Admitting: Emergency Medicine

## 2020-01-24 ENCOUNTER — Encounter: Payer: Self-pay | Admitting: Emergency Medicine

## 2020-01-24 DIAGNOSIS — U071 COVID-19: Secondary | ICD-10-CM | POA: Diagnosis not present

## 2020-01-24 DIAGNOSIS — J22 Unspecified acute lower respiratory infection: Secondary | ICD-10-CM

## 2020-01-24 DIAGNOSIS — R0602 Shortness of breath: Secondary | ICD-10-CM | POA: Diagnosis present

## 2020-01-24 LAB — BASIC METABOLIC PANEL
Anion gap: 12 (ref 5–15)
BUN: 8 mg/dL (ref 6–20)
CO2: 19 mmol/L — ABNORMAL LOW (ref 22–32)
Calcium: 8.7 mg/dL — ABNORMAL LOW (ref 8.9–10.3)
Chloride: 105 mmol/L (ref 98–111)
Creatinine, Ser: 0.71 mg/dL (ref 0.44–1.00)
GFR, Estimated: >60 mL/min (ref >60)
Glucose, Bld: 142 mg/dL — ABNORMAL HIGH (ref 70–99)
Potassium: 3.6 mmol/L (ref 3.5–5.1)
Sodium: 136 mmol/L (ref 135–145)

## 2020-01-24 LAB — CBC
HCT: 40.9 % (ref 36.0–46.0)
Hemoglobin: 13.5 g/dL (ref 12.0–15.0)
MCH: 30.2 pg (ref 26.0–34.0)
MCHC: 33 g/dL (ref 30.0–36.0)
MCV: 91.5 fL (ref 80.0–100.0)
Platelets: 289 10*3/uL (ref 150–400)
RBC: 4.47 MIL/uL (ref 3.87–5.11)
RDW: 12.6 % (ref 11.5–15.5)
WBC: 13.9 10*3/uL — ABNORMAL HIGH (ref 4.0–10.5)
nRBC: 0 % (ref 0.0–0.2)

## 2020-01-24 NOTE — ED Triage Notes (Signed)
Patient presents to the ED with shortness of breath.  Patient states she has had fever, cough and congestion since last Tuesday.  Patient had a positive at home covid test.  Patient states she was prescribed albuterol but has needed to take it more often than prescribed.  Patient appears short of breath during triage.  Patient also reports nausea and states she vomited x 1 yesterday.

## 2020-01-25 LAB — POC SARS CORONAVIRUS 2 AG -  ED: SARS Coronavirus 2 Ag: POSITIVE — AB

## 2020-01-25 MED ORDER — AZITHROMYCIN 250 MG PO TABS: ORAL_TABLET | ORAL | 0 refills | Status: DC

## 2020-01-25 MED ORDER — HYDROCOD POLST-CPM POLST ER 10-8 MG/5ML PO SUER: 5.0000 mL | Freq: Two times a day (BID) | ORAL | 0 refills | Status: DC | PRN

## 2020-01-25 MED ORDER — ALBUTEROL SULFATE HFA 108 (90 BASE) MCG/ACT IN AERS: INHALATION_SPRAY | RESPIRATORY_TRACT | 1 refills | Status: DC

## 2020-01-25 MED ORDER — PREDNISONE 10 MG PO TABS: ORAL_TABLET | ORAL | 0 refills | Status: DC

## 2020-01-25 NOTE — Discharge Instructions (Signed)
As we discussed, although you have tested positive for COVID-19 (coronavirus), you do not need to be hospitalized at this time.  Read through all the included information including the recommendations from the CDC.  We recommend that you self-quarantine at home with your immediate family only (people with whom you have already been in contact) for about 7 days after your fever has gone away (without taking medication to make your temperature come down, such as Tylenol (acetaminophen)), after your respiratory symptoms have improved.  You should have as minimal contact as possible with anyone else including close family as per the [facility] paperwork guidelines listed below. Follow-up with your doctor by phone or online as needed and return immediately to the emergency department or call 911 only if you develop new or worsening symptoms that concern you.  If you were prescribed any medications, please use them as instructed.  If you were given information for the COVID-19 antibody infusion treatment clinic, please call them and leave your contact information.  This can be a very effective and important treatment method, and you should discuss with them if you qualify for treatment.  They may even have transportation services available to them from [place] if you require them.  You can find up-to-date information about COVID-19 in [place] by calling the [facility] Helpline: [phone]) [phone]. You may also call 2-1-1, or [phone], or additional resources.  You can also find information online at PureLoser.gl, or on the Center for Disease Control (CDC) website at http://bradshaw.com/.  Please take the prescribed medications in addition to the medications you were already prescribed.  We wrote you a prescription for an additional antibiotic called azithromycin which she  will take for the next 5 days together with the Augmentin which should provide additional antibacterial coverage in case you have a degree of bacterial pneumonia on top of the COVID-19.  Please also take the prednisone as prescribed which may help with your breathing difficulties.  Remember that you can use the albuterol more often to help with your breathing, essentially as often as you want but try doing for puffs on the inhaler every 2 hours.

## 2020-01-25 NOTE — ED Provider Notes (Signed)
[facility] Emergency Department Provider Note  ____________________________________________   Event Date/Time   First MD Initiated Contact with Patient 01/24/20 2343     (approximate)  I have reviewed the triage vital signs and the nursing notes.   HISTORY  Chief Complaint Shortness of Breath    HPI Alexandra Warren is a 40 y.o. female with no chronic medical conditions who is fully vaccinated against COVID-19.  She had multiple Covid contacts a little more than a week ago and tested positive with a home antigen test a week ago.  She continues to have symptoms that seem to be getting worse including shortness of breath particularly with exertion, frequent cough, general malaise, sore throat, fever, decreased appetite, and generalized weakness.  She had a telehealth visit with a physician yesterday who prescribed her Augmentin and an albuterol inhaler but says she has to use the inhaler more often than prescribed and was worried she might need more medicine.  Nothing in particular makes the symptoms better.  Exertion makes her symptoms worse.  She has no abdominal pain nor chest pain.  No history of blood clots in the legs of the lungs.  She does not smoke and does not have diabetes and is not immunocompromise.         Past Medical History:  Diagnosis Date  . Acid reflux   . Anxiety and depression    Dr. Gala Murdoch, MD  . Back pain   . Fibromyalgia   . History of gallstones   . Migraine     Patient Active Problem List   Diagnosis Date Noted  . Insomnia 08/02/2013  . Migraine without aura 07/27/2012  . Generalized anxiety disorder 07/27/2012  . Depression 07/27/2012    Past Surgical History:  Procedure Laterality Date  . CHOLECYSTECTOMY  12/2004    Prior to Admission medications   Medication Sig Start Date End Date Taking? Authorizing Provider  albuterol (VENTOLIN HFA) 108 (90 Base) MCG/ACT inhaler Inhale 2-4 puffs by mouth every 4  hours as needed for wheezing, cough, and/or shortness of breath 01/25/20  Yes Hinda Kehr, MD  azithromycin (ZITHROMAX) 250 MG tablet Take 2 tablets PO on day 1, then take 1 tablet PO daily for 4 more days 01/25/20  Yes Hinda Kehr, MD  chlorpheniramine-HYDROcodone [facility] PENNKINETIC ER) 10-8 MG/5ML SUER Take 5 mLs by mouth every 12 (twelve) hours as needed for cough. 01/25/20  Yes Hinda Kehr, MD  predniSONE (DELTASONE) 10 MG tablet Take 6 tabs (60 mg) PO x 3 days, then take 4 tabs (40 mg) PO x 3 days, then take 2 tabs (20 mg) PO x 3 days, then take 1 tab (10 mg) PO x 3 days, then take 1/2 tab (5 mg) PO x 4 days. 01/25/20  Yes Hinda Kehr, MD  almotriptan (AXERT) 12.5 MG tablet Take 1 tab at onset of migraine.  May repeat in 2 hrs, if needed.  Max dose: 2 tabs/day. This is a 30 day prescription. 12/05/19   Suzzanne Cloud, NP  azelastine (ASTELIN) 0.1 % nasal spray U 1 TO 2 SPRAYS IEN BID PRN 07/30/18   [provider]  butalbital-acetaminophen-caffeine (FIORICET) 50-325-40 MG tablet Take 1 tablet by mouth every 8 (eight) hours as needed for headache. 12/05/19   Suzzanne Cloud, NP  clonazePAM (KLONOPIN) 1 MG tablet Take 1 mg by mouth 3 (three) times daily as needed for anxiety.    [provider]  Galcanezumab-gnlm (EMGALITY) 120 MG/ML SOAJ Inject 120  mg into the skin every 30 (thirty) days. 12/05/19   Suzzanne Cloud, NP  Norgestimate-Ethinyl Estradiol Triphasic (TRINESSA LO) 0.18/0.215/0.25 MG-25 MCG tab Take 1 tablet by mouth daily. Takes for 21 days , skip placebo and start next pack.    [provider]  ondansetron (ZOFRAN) 8 MG tablet Take 1 tablet (8 mg total) by mouth every 8 (eight) hours as needed for nausea or vomiting. 12/05/19   Suzzanne Cloud, NP  Rimegepant Sulfate (NURTEC) 75 MG TBDP Take 75 mg by mouth as needed (Take 1 at onset of headache, max is 75 mg in 24 hours). 06/28/19   Suzzanne Cloud, NP  tiZANidine (ZANAFLEX) 4 MG tablet TK 1 T PO Q 8 H PRF MUSCLE  SPASM 12/20/17   [provider]  topiramate (TOPAMAX) 100 MG tablet Take 1 tablet (100 mg total) by mouth 2 (two) times daily. 04/21/19   Suzzanne Cloud, NP  traMADol (ULTRAM) 50 MG tablet Take 50 mg by mouth 4 (four) times daily as needed for pain.    [provider]  TRINTELLIX 5 MG TABS daily. 10/02/16   [provider]    Allergies Promethazine and Pseudoeph-doxylamine-dm-apap  Family History  Problem Relation Age of Onset  . Healthy Mother   . Hypertension Father   . Lung cancer Paternal Grandfather   . Colon cancer Neg Hx   . Rectal cancer Neg Hx   . Liver cancer Neg Hx   . Stomach cancer Neg Hx   . Esophageal cancer Neg Hx     Social History Social History   Tobacco Use  . Smoking status: Never Smoker  . Smokeless tobacco: Never Used  Substance Use Topics  . Alcohol use: No  . Drug use: No    Review of Systems Constitutional: Positive for fever, generalized weakness, and general malaise. Eyes: No visual changes. ENT: +sore throat. Cardiovascular: Denies chest pain. Respiratory: Shortness of breath and cough, particular with exertion. Gastrointestinal: No abdominal pain.  Mild nausea and vomiting, decreased appetite.  No diarrhea.  No constipation. Genitourinary: Negative for dysuria. Musculoskeletal: Negative for neck pain.  Negative for back pain. Integumentary: Negative for rash. Neurological: Negative for headaches, focal weakness or numbness.   ____________________________________________   PHYSICAL EXAM:  VITAL SIGNS: ED Triage Vitals  Enc Vitals Group     BP 01/24/20 1615 (!) 133/107     Pulse Rate 01/24/20 1615 (!) 120     Resp 01/24/20 1615 (!) 24     Temp 01/24/20 1615 100.1 F (37.8 C)     Temp Source 01/24/20 1615 Oral     SpO2 01/24/20 1615 94 %     Weight 01/24/20 1616 79.4 kg (175 lb)     Height 01/24/20 1616 1.676 m (5\' 6" )     Head Circumference --      Peak Flow --      Pain Score 01/24/20 1616 6     Pain  Loc --      Pain Edu? --      Excl. in [place]? --     Constitutional: Alert and oriented.  Eyes: Conjunctivae are normal.  Head: Atraumatic. Nose: No congestion/rhinnorhea. Mouth/Throat: Patient is wearing a mask. Neck: No stridor.  No meningeal signs.   Cardiovascular: Mild tachycardia (improved from triage), regular rhythm. Good peripheral circulation. Respiratory: Mild tachypnea with no accessory muscle usage or retractions.  Lungs are clear to auscultation.  Frequent cough. Gastrointestinal: Soft and nontender. No distention.  Musculoskeletal: No  lower extremity tenderness nor edema. No gross deformities of extremities. Neurologic:  Normal speech and language. No gross focal neurologic deficits are appreciated.  Skin:  Skin is warm, dry and intact. Psychiatric: Mood and affect are normal. Speech and behavior are normal.  ____________________________________________   LABS (all labs ordered are listed, but only abnormal results are displayed)  Labs Reviewed  BASIC METABOLIC PANEL - Abnormal; Notable for the following components:      Result Value   CO2 19 (*)    Glucose, Bld 142 (*)    Calcium 8.7 (*)    All other components within normal limits  CBC - Abnormal; Notable for the following components:   WBC 13.9 (*)    All other components within normal limits  POC SARS CORONAVIRUS 2 AG -  ED - Abnormal; Notable for the following components:   SARS Coronavirus 2 Ag POSITIVE (*)    All other components within normal limits   ____________________________________________  EKG  ED ECG REPORT I, Loleta Rose, the attending physician, personally viewed and interpreted this ECG.  Date: 01/24/2020 EKG Time: 16:15 Rate: 119 Rhythm: sinus tachycardia QRS Axis: normal Intervals: normal ST/T Wave abnormalities: normal Narrative Interpretation: no evidence of acute ischemia  ____________________________________________  RADIOLOGY I, Loleta Rose, personally viewed and  evaluated these images (plain radiographs) as part of my medical decision making, as well as reviewing the written report by the radiologist.  ED MD interpretation: Multi lobar pneumonia  Official radiology report(s): DG Chest 2 View  Result Date: 01/24/2020 CLINICAL DATA:  Shortness of breath. EXAM: CHEST - 2 VIEW COMPARISON:  None. FINDINGS: The heart size and mediastinal contours are within normal limits. No pneumothorax or pleural effusion is noted. Left lung is clear. Right upper and lower lobe airspace opacities are noted concerning for pneumonia. The visualized skeletal structures are unremarkable. IMPRESSION: Right upper and lower lobe pneumonia. Electronically Signed   By: Lupita Raider M.D.   On: 01/24/2020 16:47    ____________________________________________   PROCEDURES   Procedure(s) performed (including Critical Care):  Procedures   ____________________________________________   INITIAL IMPRESSION / MDM / ASSESSMENT AND PLAN / ED COURSE  As part of my medical decision making, I reviewed the following data within the electronic MEDICAL RECORD NUMBER Nursing notes reviewed and incorporated, Labs reviewed , EKG interpreted , Old chart reviewed, Radiograph reviewed , Notes from prior ED visits and [place] Controlled Substance Database   Differential diagnosis includes, but is not limited to, COVID-19, bacterial pneumonia superinfection, PE.  COVID antigen test again positive tonight after 70 days of symptoms.  Vitals are consistent with Covid pneumonia with tachycardia and tachypnea.  However her oxygen saturation is in the upper 90s and she is speaking easily and breathing easily, exertion just makes symptoms worse.  Very low suspicion for PE.  I personally reviewed the patient's imaging and agree with the radiologist's interpretation that she has evidence of multilobar pneumonia, again likely COVID-19.  Since she was just started on Augmentin I encouraged her to continue taking it;  it is possible she may have a bacterial superinfection given her mild leukocytosis which is not always seen in COVID-19, but the Augmentin may help.   Given her persistent symptoms and radiographic changes, I am giving her a prednisone taper.  I am also giving her another prescription for albuterol in case she runs out.  I wrote a prescription for Tussionex cough syrup.  I am also giving her a course of azithromycin to take  in conjunction with the Augmentin for atypical coverage.  Patient understands and agrees with the plan.  I gave my usual COVID-19 management recommendations and return precautions           ____________________________________________  FINAL CLINICAL IMPRESSION(S) / ED DIAGNOSES  Final diagnoses:  Lower respiratory tract infection due to COVID-19 virus     MEDICATIONS GIVEN DURING THIS VISIT:  Medications - No data to display   ED Discharge Orders         Ordered    albuterol (VENTOLIN HFA) 108 (90 Base) MCG/ACT inhaler       Note to Pharmacy: Pharmacy may substitute brand and size for insurance-approved equivalent   01/25/20 0027    predniSONE (DELTASONE) 10 MG tablet        01/25/20 0027    chlorpheniramine-HYDROcodone (TUSSIONEX PENNKINETIC ER) 10-8 MG/5ML SUER  Every 12 hours PRN        01/25/20 0027    azithromycin (ZITHROMAX) 250 MG tablet        01/25/20 0034          *Please note:  NASHELLE LEDERMAN was evaluated in Emergency Department on 01/25/2020 for the symptoms described in the history of present illness. She was evaluated in the context of the global COVID-19 pandemic, which necessitated consideration that the patient might be at risk for infection with the SARS-CoV-2 virus that causes COVID-19. Institutional protocols and algorithms that pertain to the evaluation of patients at risk for COVID-19 are in a state of rapid change based on information released by regulatory bodies including the CDC and federal and state organizations. These policies  and algorithms were followed during the patient's care in the ED.  Some ED evaluations and interventions may be delayed as a result of limited staffing during and after the pandemic.*  Note:  This document was prepared using Dragon voice recognition software and may include unintentional dictation errors.   Hinda Kehr, MD 01/25/20 [phone]

## 2020-01-25 NOTE — ED Notes (Signed)
Patient discharged to home per MD order. Patient in stable condition, and deemed medically cleared by ED provider for discharge. Discharge instructions reviewed with patient/family using "Teach Back"; verbalized understanding of medication education and administration, and information about follow-up care. Denies further concerns. ° °

## 2020-02-06 ENCOUNTER — Telehealth: Payer: Self-pay | Admitting: Neurology

## 2020-02-06 NOTE — Telephone Encounter (Signed)
Patient has a Botox appointment on 1/25. I called [facility] [phone]) and spoke with Marcie Bal to schedule delivery. Botox TBD 1/20.

## 2020-02-09 NOTE — Telephone Encounter (Signed)
(  1) 200U vial of Botox arrived today from [place] [facility].

## 2020-02-14 ENCOUNTER — Encounter: Payer: Self-pay | Admitting: Neurology

## 2020-02-14 ENCOUNTER — Ambulatory Visit: Payer: PRIVATE HEALTH INSURANCE | Admitting: Neurology

## 2020-02-29 ENCOUNTER — Encounter: Payer: Self-pay | Admitting: Neurology

## 2020-02-29 ENCOUNTER — Ambulatory Visit (INDEPENDENT_AMBULATORY_CARE_PROVIDER_SITE_OTHER): Payer: PRIVATE HEALTH INSURANCE | Admitting: Neurology

## 2020-02-29 DIAGNOSIS — G43009 Migraine without aura, not intractable, without status migrainosus: Secondary | ICD-10-CM | POA: Diagnosis not present

## 2020-02-29 NOTE — Procedures (Signed)
     BOTOX PROCEDURE NOTE FOR MIGRAINE HEADACHE   HISTORY: Alexandra Warren is a 40 year old female with history of chronic migraine headache, presents today for second Botox injection.  Her initial Botox injection was in October 2021.  She identifies a 30% improvement in her migraine status.  In general, migraines are less intense, and less frequent.  She did have a minor setback in December, she acquired COVID, PNA.  Had a few more headaches.  She has not had to miss any days of work, or school.  She is quite pleased with her current preventative medications which include Topamax and Emgality.  She has a cocktail of rescue medications including Axert, Nurtec, and Fioricet.   Description of procedure:  The patient was placed in a sitting position. The standard protocol was used for Botox as follows, with 5 units of Botox injected at each site:   -Procerus muscle, midline injection  -Corrugator muscle, bilateral injection  -Frontalis muscle, bilateral injection, with 2 sites each side, medial injection was performed in the upper one third of the frontalis muscle, in the region vertical from the medial inferior edge of the superior orbital rim. The lateral injection was again in the upper one third of the forehead vertically above the lateral limbus of the cornea, 1.5 cm lateral to the medial injection site.  -Temporalis muscle injection, 4 sites, bilaterally. The first injection was 3 cm above the tragus of the ear, second injection site was 1.5 cm to 3 cm up from the first injection site in line with the tragus of the ear. The third injection site was 1.5-3 cm forward between the first 2 injection sites. The fourth injection site was 1.5 cm posterior to the second injection site.  -Occipitalis muscle injection, 3 sites, bilaterally. The first injection was done one half way between the occipital protuberance and the tip of the mastoid process behind the ear. The second injection site was done  lateral and superior to the first, 1 fingerbreadth from the first injection. The third injection site was 1 fingerbreadth superiorly and medially from the first injection site.  -Cervical paraspinal muscle injection, 2 sites, bilateral, the first injection site was 1 cm from the midline of the cervical spine, 3 cm inferior to the lower border of the occipital protuberance. The second injection site was 1.5 cm superiorly and laterally to the first injection site.  -Trapezius muscle injection was performed at 3 sites, bilaterally. The first injection site was in the upper trapezius muscle halfway between the inflection point of the neck, and the acromion. The second injection site was one half way between the acromion and the first injection site. The third injection was done between the first injection site and the inflection point of the neck.   A 200 unit bottle of Botox was used, 155 units were injected, the rest of the Botox was wasted. The patient tolerated the procedure well, there were no complications of the above procedure.  Botox NDC 1751-0258-52 Lot number [id number] Expiration date 10/2022 S/P

## 2020-02-29 NOTE — Progress Notes (Signed)
Botox- 200 units x 1 vial Lot: [id number] Expiration: 10/2022 NDC: 0301499692   Bacteriostatic 0.9% Sodium Chloride- 86mL total [id number] Expiration: 02/23 NDC: 44584-835-07  S/P

## 2020-05-01 ENCOUNTER — Other Ambulatory Visit: Payer: Self-pay | Admitting: *Deleted

## 2020-05-01 MED ORDER — TOPIRAMATE 100 MG PO TABS: 100.0000 mg | ORAL_TABLET | Freq: Two times a day (BID) | ORAL | 11 refills | Status: DC

## 2020-05-01 MED ORDER — NURTEC 75 MG PO TBDP: 75.0000 mg | ORAL_TABLET | ORAL | 11 refills | Status: DC | PRN

## 2020-05-01 NOTE — Addendum Note (Signed)
Addended by: Brandon Melnick on: 05/01/2020 01:04 PM   Modules accepted: Orders

## 2020-05-11 ENCOUNTER — Encounter: Payer: Self-pay | Admitting: Neurology

## 2020-05-14 ENCOUNTER — Ambulatory Visit: Payer: PRIVATE HEALTH INSURANCE | Admitting: Neurology

## 2020-05-15 ENCOUNTER — Telehealth: Payer: Self-pay | Admitting: Neurology

## 2020-05-15 NOTE — Telephone Encounter (Signed)
Called pt and LMVM for her that have not gotten FMLA p/w yet,  Gave her fax # [phone].  Also per insurance only allowing botox or emgality.  What would she want [place] to do?  Let us know.

## 2020-05-15 NOTE — Telephone Encounter (Signed)
Patient has a Botox appointment on 5/23. She previously had a pharmacy PA for Botox through [facility]. I called patient's insurance [facility], [phone]) and spoke with Helene Kelp to check PA requirements for codes [phone] and [phone]. Helene Kelp states no PA is required for either code. Reference #TeresaS.   I called [facility] [phone]) and spoke with Janett Billow. She created a new PA request and submitted it for review.

## 2020-05-17 NOTE — Telephone Encounter (Signed)
I called [facility] [phone]) to check status of Botox PA. I spoke with Izora Gala. She states that it is still in progress.

## 2020-05-22 ENCOUNTER — Encounter: Payer: Self-pay | Admitting: Neurology

## 2020-05-22 NOTE — Progress Notes (Signed)
Addendum 05/22/20 SS: She will stop CGRP medication and continue on Botox therapy alone for chronic migraine headache treatment.

## 2020-05-23 ENCOUNTER — Telehealth: Payer: Self-pay | Admitting: *Deleted

## 2020-05-23 NOTE — Telephone Encounter (Signed)
I faxed pt [employer] form on 05/23/20

## 2020-05-23 NOTE — Telephone Encounter (Signed)
See note from 4/26

## 2020-05-23 NOTE — Telephone Encounter (Signed)
Received approval from [place]. PA #[id number] (05/15/20- 11/13/20). I called [facility] [phone]) and spoke with Judson Roch to schedule Botox delivery. Botox TBD 5/10.

## 2020-05-24 ENCOUNTER — Encounter: Payer: Self-pay | Admitting: *Deleted

## 2020-05-24 DIAGNOSIS — Z0289 Encounter for other administrative examinations: Secondary | ICD-10-CM

## 2020-05-24 NOTE — Telephone Encounter (Signed)
This was given to medical records 05-23-20.  Will ask about Accomodations (see inbox)

## 2020-05-24 NOTE — Telephone Encounter (Signed)
Letter for accomodation done. To you for signature.

## 2020-05-24 NOTE — Telephone Encounter (Signed)
To whom it may concern,   La is a patient at our neurology office, with a diagnosis of chronic migraine headaches.  If at all possible, please consider an accommodation of remote work if this would allow for satisfactory performance of her job responsibilities. She has indicated to me that work is very important to her.  Please work with her to determine what accommodations can be provided going forward.  Please reach out if you have any questions.

## 2020-05-25 ENCOUNTER — Encounter: Payer: Self-pay | Admitting: Neurology

## 2020-05-29 ENCOUNTER — Ambulatory Visit: Payer: PRIVATE HEALTH INSURANCE | Admitting: Neurology

## 2020-05-29 NOTE — Telephone Encounter (Signed)
Received (1) 200 unit vial of Botox today from [facility].

## 2020-06-07 ENCOUNTER — Encounter: Payer: Self-pay | Admitting: Neurology

## 2020-06-11 ENCOUNTER — Ambulatory Visit (INDEPENDENT_AMBULATORY_CARE_PROVIDER_SITE_OTHER): Payer: PRIVATE HEALTH INSURANCE | Admitting: Neurology

## 2020-06-11 ENCOUNTER — Other Ambulatory Visit: Payer: Self-pay

## 2020-06-11 DIAGNOSIS — G43009 Migraine without aura, not intractable, without status migrainosus: Secondary | ICD-10-CM | POA: Diagnosis not present

## 2020-06-11 MED ORDER — BUTALBITAL-APAP-CAFFEINE 50-325-40 MG PO TABS: 1.0000 | ORAL_TABLET | Freq: Three times a day (TID) | ORAL | 3 refills | Status: DC | PRN

## 2020-06-11 NOTE — Progress Notes (Signed)
Botox- 200 units x 1 vial [id number]  Expiration: 01/2023 NDC: 5379-4327-61   Bacteriostatic 0.9% Sodium Chloride- 65mL total [id number] Expiration: 02/23 NDC: 34037-096-43   S/P

## 2020-06-11 NOTE — Procedures (Signed)
     BOTOX PROCEDURE NOTE FOR MIGRAINE HEADACHE   HISTORY: Alexandra Warren is a 40 year old female with history of chronic migraine headache, presents today for her third Botox injection.  She reports about a 30% improvement with Botox.  The last month has had a few more migraines, related to going back into the office a few days a week, fluorescent lighting, seasonal allergies.  Her insurance required her to come off [place], last injection was in April. Has paperwork to request work from home status.   Description of procedure:  The patient was placed in a sitting position. The standard protocol was used for Botox as follows, with 5 units of Botox injected at each site:   -Procerus muscle, midline injection  -Corrugator muscle, bilateral injection  -Frontalis muscle, bilateral injection, with 2 sites each side, medial injection was performed in the upper one third of the frontalis muscle, in the region vertical from the medial inferior edge of the superior orbital rim. The lateral injection was again in the upper one third of the forehead vertically above the lateral limbus of the cornea, 1.5 cm lateral to the medial injection site.  -Temporalis muscle injection, 4 sites, bilaterally. The first injection was 3 cm above the tragus of the ear, second injection site was 1.5 cm to 3 cm up from the first injection site in line with the tragus of the ear. The third injection site was 1.5-3 cm forward between the first 2 injection sites. The fourth injection site was 1.5 cm posterior to the second injection site.  -Occipitalis muscle injection, 3 sites, bilaterally. The first injection was done one half way between the occipital protuberance and the tip of the mastoid process behind the ear. The second injection site was done lateral and superior to the first, 1 fingerbreadth from the first injection. The third injection site was 1 fingerbreadth superiorly and medially from the first injection  site.  -Cervical paraspinal muscle injection, 2 sites, bilateral, the first injection site was 1 cm from the midline of the cervical spine, 3 cm inferior to the lower border of the occipital protuberance. The second injection site was 1.5 cm superiorly and laterally to the first injection site.  -Trapezius muscle injection was performed at 3 sites, bilaterally. The first injection site was in the upper trapezius muscle halfway between the inflection point of the neck, and the acromion. The second injection site was one half way between the acromion and the first injection site. The third injection was done between the first injection site and the inflection point of the neck.   A 200 unit bottle of Botox was used, 155 units were injected, the rest of the Botox was wasted. The patient tolerated the procedure well, there were no complications of the above procedure.  Botox NDC 0623-7628-31 Lot number [id number] Expiration date 01/2023 S/P

## 2020-06-12 ENCOUNTER — Telehealth: Payer: Self-pay | Admitting: *Deleted

## 2020-06-12 NOTE — Telephone Encounter (Signed)
Completed to SS/NP for review, completion, signature.

## 2020-06-12 NOTE — Telephone Encounter (Signed)
To medical records.

## 2020-06-13 ENCOUNTER — Telehealth: Payer: Self-pay | Admitting: Neurology

## 2020-06-13 ENCOUNTER — Ambulatory Visit: Payer: PRIVATE HEALTH INSURANCE | Admitting: Neurology

## 2020-06-13 NOTE — Telephone Encounter (Signed)
Called patent to pick up form.

## 2020-07-04 ENCOUNTER — Other Ambulatory Visit: Payer: Self-pay | Admitting: Family Medicine

## 2020-07-04 DIAGNOSIS — Z1231 Encounter for screening mammogram for malignant neoplasm of breast: Secondary | ICD-10-CM

## 2020-08-02 ENCOUNTER — Telehealth: Payer: Self-pay | Admitting: *Deleted

## 2020-08-02 NOTE — Telephone Encounter (Signed)
PA for Nurtec 75mg  started on covermymeds ([id number]). Pt has pharmacy coverage through [employer] [phone]). Decision pending.   [employer]: Ph: [phone] Fax: [phone]

## 2020-08-06 NOTE — Telephone Encounter (Signed)
UWT#[id number] approved for the life of the plan.

## 2020-08-13 NOTE — Telephone Encounter (Signed)
[facility] has previously given the patient Depacon 1 gram IV and Toradol '30mg'$  IV. This combination worked well for her. Per VO by Butler Denmark, NP, she may come in today for this same infusion. The provider order form has been completed, signed by Judson Roch and returned to [employer]. The patient will be here at 3pm with a driver.

## 2020-08-16 ENCOUNTER — Other Ambulatory Visit: Payer: Self-pay | Admitting: Family Medicine

## 2020-08-16 DIAGNOSIS — Z1231 Encounter for screening mammogram for malignant neoplasm of breast: Secondary | ICD-10-CM

## 2020-08-22 ENCOUNTER — Telehealth: Payer: Self-pay | Admitting: Neurology

## 2020-08-22 NOTE — Telephone Encounter (Signed)
Pt never picked up paperwork May so mailed "Samet".

## 2020-08-29 ENCOUNTER — Other Ambulatory Visit: Payer: Self-pay

## 2020-08-29 ENCOUNTER — Ambulatory Visit
Admission: RE | Admit: 2020-08-29 | Discharge: 2020-08-29 | Disposition: A | Discharge status: Home / Self Care | Payer: No Typology Code available for payment source | Source: Ambulatory Visit

## 2020-08-29 DIAGNOSIS — Z1231 Encounter for screening mammogram for malignant neoplasm of breast: Secondary | ICD-10-CM

## 2020-09-03 ENCOUNTER — Other Ambulatory Visit: Payer: Self-pay | Admitting: Family Medicine

## 2020-09-03 DIAGNOSIS — R928 Other abnormal and inconclusive findings on diagnostic imaging of breast: Secondary | ICD-10-CM

## 2020-09-04 ENCOUNTER — Telehealth: Payer: Self-pay | Admitting: Neurology

## 2020-09-04 NOTE — Telephone Encounter (Signed)
Patient has a Botox appointment 8/24 with Dr. Krista Blue. Patient normally sees Judson Roch, but Judson Roch is currently out of office. Patient has a PA for Botox through [facility]. I called them @  [phone] and spoke with Dalene Seltzer to make sure PA is valid when Botox is done by Dr. Krista Blue. Dalene Seltzer states the PA is good for any provider in our office while PA is approved (through 11/13/20). Reference [id number].  I called [facility] @ [phone] and spoke with Charleston Ropes to set up Botox shipment. Charleston Ropes states the patient's prescription insurance has been terminated. I reached out to the patient to verify.

## 2020-09-05 NOTE — Telephone Encounter (Signed)
I called [employer] @ [phone] and spoke with Vaughan Basta to verify eligibility. She states member is active and no PA is required for CPT J0585 and [phone]. Reference [id number]. Patient will be buy and bill.

## 2020-09-08 ENCOUNTER — Ambulatory Visit
Admission: RE | Admit: 2020-09-08 | Discharge: 2020-09-08 | Disposition: A | Discharge status: Home / Self Care | Payer: No Typology Code available for payment source | Source: Ambulatory Visit | Attending: Family Medicine | Admitting: Family Medicine

## 2020-09-08 ENCOUNTER — Other Ambulatory Visit: Payer: Self-pay

## 2020-09-08 ENCOUNTER — Other Ambulatory Visit: Payer: Self-pay | Admitting: Family Medicine

## 2020-09-08 DIAGNOSIS — R928 Other abnormal and inconclusive findings on diagnostic imaging of breast: Secondary | ICD-10-CM

## 2020-09-12 ENCOUNTER — Ambulatory Visit
Admission: RE | Admit: 2020-09-12 | Discharge: 2020-09-12 | Disposition: A | Discharge status: Home / Self Care | Payer: No Typology Code available for payment source | Source: Ambulatory Visit | Attending: Family Medicine | Admitting: Family Medicine

## 2020-09-12 ENCOUNTER — Ambulatory Visit: Payer: PRIVATE HEALTH INSURANCE | Admitting: Neurology

## 2020-09-12 ENCOUNTER — Other Ambulatory Visit: Payer: Self-pay

## 2020-09-12 ENCOUNTER — Other Ambulatory Visit: Payer: Self-pay | Admitting: Family Medicine

## 2020-09-12 DIAGNOSIS — R928 Other abnormal and inconclusive findings on diagnostic imaging of breast: Secondary | ICD-10-CM

## 2020-09-18 ENCOUNTER — Other Ambulatory Visit: Payer: Self-pay | Admitting: General Surgery

## 2020-09-18 DIAGNOSIS — Z170 Estrogen receptor positive status [ER+]: Secondary | ICD-10-CM

## 2020-09-18 DIAGNOSIS — C50211 Malignant neoplasm of upper-inner quadrant of right female breast: Secondary | ICD-10-CM

## 2020-09-19 ENCOUNTER — Other Ambulatory Visit: Payer: Self-pay | Admitting: General Surgery

## 2020-09-19 DIAGNOSIS — N63 Unspecified lump in unspecified breast: Secondary | ICD-10-CM

## 2020-09-20 ENCOUNTER — Other Ambulatory Visit: Payer: Self-pay

## 2020-09-20 ENCOUNTER — Inpatient Hospital Stay: Payer: No Typology Code available for payment source | Attending: Genetic Counselor | Admitting: Genetic Counselor

## 2020-09-20 ENCOUNTER — Inpatient Hospital Stay: Payer: No Typology Code available for payment source

## 2020-09-20 ENCOUNTER — Other Ambulatory Visit: Payer: Self-pay | Admitting: General Surgery

## 2020-09-20 DIAGNOSIS — Z803 Family history of malignant neoplasm of breast: Secondary | ICD-10-CM

## 2020-09-20 DIAGNOSIS — C50211 Malignant neoplasm of upper-inner quadrant of right female breast: Secondary | ICD-10-CM

## 2020-09-20 DIAGNOSIS — Z800 Family history of malignant neoplasm of digestive organs: Secondary | ICD-10-CM | POA: Diagnosis not present

## 2020-09-20 DIAGNOSIS — Z170 Estrogen receptor positive status [ER+]: Secondary | ICD-10-CM

## 2020-09-20 LAB — GENETIC SCREENING ORDER

## 2020-09-21 ENCOUNTER — Encounter: Payer: Self-pay | Admitting: Genetic Counselor

## 2020-09-21 ENCOUNTER — Ambulatory Visit
Admission: RE | Admit: 2020-09-21 | Discharge: 2020-09-21 | Disposition: A | Discharge status: Home / Self Care | Payer: No Typology Code available for payment source | Source: Ambulatory Visit | Attending: General Surgery | Admitting: General Surgery

## 2020-09-21 ENCOUNTER — Other Ambulatory Visit: Payer: Self-pay | Admitting: General Surgery

## 2020-09-21 DIAGNOSIS — Z170 Estrogen receptor positive status [ER+]: Secondary | ICD-10-CM

## 2020-09-21 DIAGNOSIS — Z803 Family history of malignant neoplasm of breast: Secondary | ICD-10-CM

## 2020-09-21 DIAGNOSIS — Z800 Family history of malignant neoplasm of digestive organs: Secondary | ICD-10-CM

## 2020-09-21 DIAGNOSIS — C50211 Malignant neoplasm of upper-inner quadrant of right female breast: Secondary | ICD-10-CM

## 2020-09-21 HISTORY — DX: Malignant neoplasm of upper-inner quadrant of right female breast: C50.211

## 2020-09-21 HISTORY — DX: Family history of malignant neoplasm of digestive organs: Z80.0

## 2020-09-21 HISTORY — DX: Family history of malignant neoplasm of breast: Z80.3

## 2020-09-21 MED ORDER — GADOBUTROL 1 MMOL/ML IV SOLN
7.0000 mL | Freq: Once | INTRAVENOUS | Status: AC | PRN
  Administered 2020-09-21: 7 mL via INTRAVENOUS

## 2020-09-21 NOTE — Progress Notes (Signed)
REFERRING PROVIDER: Stark Klein, MD [address] [place],  [place] [address]   PRIMARY PROVIDER:  Jake Samples, PA-C  PRIMARY REASON FOR VISIT:  1. Malignant neoplasm of upper-inner quadrant of right breast in female, estrogen receptor positive (Alexandra Warren)   2. Family history of breast cancer   3. Family history of malignant neoplasm of digestive organ    HISTORY OF PRESENT ILLNESS:   [name], a 40 y.o. female, was seen for a [facility] cancer genetics consultation at the request of Dr. Barry Warren due to a personal history of breast cancer.  [name] presents to clinic today to discuss the possibility of a hereditary predisposition to cancer, to discuss genetic testing, and to further clarify her future cancer risks, as well as potential cancer risks for family members.   In August 2022, at the age of 69, [name] was diagnosed with invasive ductal carcinoma of the right breast (ER+/PR+/HER2+). The preliminary treatment plan includes breast conserving surgery.   RISK FACTORS:  Menarche was at age 62 or 57.  Nulliparous.  OCP use for approximately 10 years.  Ovaries intact: yes.  Hysterectomy: no.  Menopausal status: premenopausal.  HRT use: 0 years. Colonoscopy: no; not examined. Mammogram within the last year: yes. Up to date with pelvic exams: yes. Any excessive radiation exposure in the past: no  Past Medical History:  Diagnosis Date   Acid reflux    Anxiety and depression    Dr. Gala Murdoch, MD   Back pain    Family history of breast cancer 09/21/2020   Family history of malignant neoplasm of digestive organ 09/21/2020   Fibromyalgia    History of gallstones    Malignant neoplasm of upper-inner quadrant of right female breast ([place]) 09/21/2020   Migraine     Past Surgical History:  Procedure Laterality Date   CHOLECYSTECTOMY  12/2004    Social History   Socioeconomic History   Marital status: Divorced    Spouse name: Corene Cornea   Number of children: 0    Years of education: MS   Highest education level: Not on file  Occupational History   Occupation: Optometrist  Tobacco Use   Smoking status: Never   Smokeless tobacco: Never  Substance and Sexual Activity   Alcohol use: No   Drug use: No   Sexual activity: Not on file  Other Topics Concern   Not on file  Social History Narrative   Patient lives at home with her spouse.    Caffeine Use: 1-2 sodas daily   Social Determinants of Health   Financial Resource Strain: Not on file  Food Insecurity: Not on file  Transportation Needs: Not on file  Physical Activity: Not on file  Stress: Not on file  Social Connections: Not on file     FAMILY HISTORY:  We obtained a detailed, 4-generation family history.  Significant diagnoses are listed below: Family History  Problem Relation Age of Onset   Skin cancer Brother        dx before 34   Colon cancer Maternal Aunt 9   Cervical cancer Maternal Aunt        dx before 105; x2   Cancer Paternal Grandmother 99       unknown primary; ? liver   Lung cancer Paternal Grandfather        dx after 54   Breast cancer Cousin 25       maternal cousin   Colon cancer Other  MGM's sister   Gastric cancer Other        PGM's sister   Leukemia Other        PGM's sister    [name] is unaware of previous family history of genetic testing for hereditary cancer risks. There is no reported Ashkenazi Jewish ancestry. There is no known consanguinity.  GENETIC COUNSELING ASSESSMENT: [name] is a 40 y.o. female with a personal and family history of cancer which is somewhat suggestive of a hereditary cancer syndrome and predisposition to cancer given her age of diagnosis and the presence of related cancers in the family. We, therefore, discussed and recommended the following at today's visit.   DISCUSSION: We discussed that 5 - 10% of cancer is hereditary, with most cases of hereditary breast cancer associated with mutations in BRCA1/2.  There are  other genes that can be associated with hereditary breast and/or colon cancer syndromes.  Type of cancer risk and level of risk are gene-specific. We discussed that testing is beneficial for several reasons including knowing how to follow individuals after completing their treatment, identifying whether potential treatment options would be beneficial, and understanding if other family members could be at risk for cancer and allowing them to undergo genetic testing.   We reviewed the characteristics, features and inheritance patterns of hereditary cancer syndromes. We also discussed genetic testing, including the appropriate family members to test, the process of testing, insurance coverage and turn-around-time for results. We discussed the implications of a negative, positive and/or variant of uncertain significant result. In order to get genetic test results in a timely manner so that [name] can use these genetic test results for surgical decisions, we recommended [name] pursue genetic testing for the [employer].  The BRCAplus panel offered by [employer] and includes sequencing and deletion/duplication analysis for the following 8 genes: ATM, BRCA1, BRCA2, CDH1, CHEK2, PALB2, PTEN, and TP53.  Once complete, we recommend [name] pursue reflex genetic testing to a more comprehensive gene panel.   [name]  was offered a common hereditary cancer panel (47 genes) and an expanded pan-cancer panel (84 genes). [name] was informed of the benefits and limitations of each panel, including that expanded pan-cancer panels contain genes that do not have clear management guidelines at this point in time.  We also discussed that as the number of genes included on a panel increases, the chances of variants of uncertain significance increases.  After considering the benefits and limitations of each gene panel, [name]  elected to have a common hereditary cancers panel through [place].  The  CustomNext-Cancer+RNAinsight panel offered by [facility] includes sequencing and rearrangement analysis for the following 47 genes:  APC, ATM, AXIN2, BARD1, BMPR1A, BRCA1, BRCA2, BRIP1, CDH1, CDK4, CDKN2A, CHEK2, DICER1, EPCAM, GREM1, HOXB13, MEN1, MLH1, MSH2, MSH3, MSH6, MUTYH, NBN, NF1, NF2, NTHL1, PALB2, PMS2, POLD1, POLE, PTEN, RAD51C, RAD51D, RECQL, RET, SDHA, SDHAF2, SDHB, SDHC, SDHD, SMAD4, SMARCA4, STK11, TP53, TSC1, TSC2, and VHL.  RNA data is routinely analyzed for use in variant interpretation for all genes.   Based on [name]'s personal history of breast cancer, she meets medical criteria for genetic testing. Despite that she meets criteria, she may still have an out of pocket cost. We discussed that if her out of pocket cost for testing is over $100, the laboratory should contact her to discuss self-pay prices, patient pay assistance programs, if applicable, and other billing options.   PLAN: After considering the risks, benefits, and  limitations, [name] provided informed consent to pursue genetic testing and the blood sample was sent to [facility] for analysis of the BRCAPlus and CustomNext-Cancer +RNAinsight Panels. Results should be available within approximately 1-2 weeks' time, at which point they will be disclosed by telephone to [name], as will any additional recommendations warranted by these results. [name] will receive a summary of her genetic counseling visit and a copy of her results once available. This information will also be available in Epic.  Lastly, we encouraged [name] to remain in contact with cancer genetics annually so that we can continuously update the family history and inform her of any changes in cancer genetics and testing that may be of benefit for this family.   [name] questions were answered to her satisfaction today. Our contact information was provided should additional questions or concerns arise. Thank you for the referral and  allowing [place] to share in the care of your patient.   Cyndi Montejano M. Joette Catching, [place], [facility] Genetic Counselor [email or web address] (P) [phone]  The patient was seen for a total of 35 minutes in face-to-face genetic counseling.  The patient was seen alone.  Drs. Magrinat, Lindi Adie and/or Burr Medico were available to discuss this case as needed.  _______________________________________________________________________ For Office Staff:  Number of people involved in session: 1 Was an Intern/ student involved with case: no

## 2020-09-25 ENCOUNTER — Telehealth: Payer: Self-pay | Admitting: *Deleted

## 2020-09-25 MED ORDER — TIZANIDINE HCL 4 MG PO TABS: 4.0000 mg | ORAL_TABLET | Freq: Three times a day (TID) | ORAL | 5 refills | Status: DC | PRN

## 2020-09-25 NOTE — Telephone Encounter (Signed)
Mychart messages:  Hi Alexandra Warren, [place] office often prescribes a muscle relaxer to help. However, Dr. Krista Blue typically only allows 20 tablets each month to be used only as needed for migraines. Would you like her to sent in the prescription? If are going to require a larger quantity for your fibromyalgia, then you would need to discuss it with your pain management physician. Sincerely, Tonye Royalty, Paul M  P [facility] (supporting Marcial Pacas, MD) 3 days ago   May I have a refill of Tizanidine?  I have been using this for migraines but I normally get this from the doctor I see for fibromyalgia but I have not been to her in awhile and lately I am taking it more for migraines than for fibromyalgia related reasons.   Thank you, ___________________________________  Refill request will be sent to MD.

## 2020-09-26 ENCOUNTER — Other Ambulatory Visit: Payer: Self-pay | Admitting: General Surgery

## 2020-09-26 DIAGNOSIS — C50211 Malignant neoplasm of upper-inner quadrant of right female breast: Secondary | ICD-10-CM

## 2020-09-26 DIAGNOSIS — Z170 Estrogen receptor positive status [ER+]: Secondary | ICD-10-CM

## 2020-09-26 DIAGNOSIS — R9389 Abnormal findings on diagnostic imaging of other specified body structures: Secondary | ICD-10-CM

## 2020-09-28 ENCOUNTER — Encounter: Payer: Self-pay | Admitting: *Deleted

## 2020-10-02 ENCOUNTER — Telehealth: Payer: Self-pay | Admitting: Genetic Counselor

## 2020-10-02 ENCOUNTER — Encounter: Payer: Self-pay | Admitting: Genetic Counselor

## 2020-10-02 DIAGNOSIS — Z1379 Encounter for other screening for genetic and chromosomal anomalies: Secondary | ICD-10-CM | POA: Insufficient documentation

## 2020-10-02 NOTE — Telephone Encounter (Addendum)
Revealed negative genetic testing for BRCAPlus STAT Panel.  Discussed that we do not know why she has breast cancer. It could be sporadic/familial, due to a different gene that we are not testing, or maybe our current technology may not be able to pick something up.  It will be important for her to keep in contact with genetics to keep up with whether additional testing may be needed.  Results of pan-cancer panel are pending.

## 2020-10-03 ENCOUNTER — Encounter: Payer: Self-pay | Admitting: Internal Medicine

## 2020-10-03 ENCOUNTER — Inpatient Hospital Stay: Payer: No Typology Code available for payment source | Attending: Internal Medicine | Admitting: Internal Medicine

## 2020-10-03 ENCOUNTER — Encounter (INDEPENDENT_AMBULATORY_CARE_PROVIDER_SITE_OTHER): Payer: Self-pay

## 2020-10-03 DIAGNOSIS — Z170 Estrogen receptor positive status [ER+]: Secondary | ICD-10-CM | POA: Diagnosis not present

## 2020-10-03 DIAGNOSIS — C50211 Malignant neoplasm of upper-inner quadrant of right female breast: Secondary | ICD-10-CM

## 2020-10-03 DIAGNOSIS — Z79899 Other long term (current) drug therapy: Secondary | ICD-10-CM | POA: Diagnosis not present

## 2020-10-03 MED ORDER — DIAZEPAM 5 MG PO TABS: ORAL_TABLET | ORAL | 0 refills | Status: DC

## 2020-10-03 NOTE — Progress Notes (Signed)
one Zeb NOTE  Patient Care Team: [name], MD as PCP - General (Family Medicine)  CHIEF COMPLAINTS/PURPOSE OF CONSULTATION: Breast cancer  #  Oncology History Overview Note   # RIGHT BREAST 2'O clock- [place]- ER-95%; PR-95%; Her 2-POSITIVE [facility] 3+]   MRI BREAST: 1. Irregular enhancing mass within the retroareolar LEFT breast, measuring 1 cm (series 9, image 78), a suspicious finding. Recommend MRI-guided biopsy to exclude contralateral disease. 2. Additional oval circumscribed ring-enhancing mass within the upper-outer quadrant of the LEFT breast, measuring 8 mm (series 9, image 57), a suspicious finding, possibly a benign intramammary lymph node. MRI-guided biopsy is recommended to exclude contralateral disease. -------------------------------------- 3. Biopsy-proven malignancy within the inner RIGHT breast, at anterior depth, 2 o'clock axis per previous ultrasound (ribbon clip). Associated mass at the biopsy site measures 1.1 cm, however, additional surrounding non-mass enhancement extends the overall measurement to 3.1 cm greatest dimension. 4. No evidence of additional multifocal or multicentric disease within the RIGHT breast. 5. No evidence of metastatic lymphadenopathy.  # Headaches-Migraine/ Anxiety [[name]; Dr.Yan  botox]   Carcinoma of upper-inner quadrant of right breast in female, estrogen receptor positive ([place])  09/21/2020 Initial Diagnosis   Malignant neoplasm of upper-inner quadrant of right female breast ([place])   10/01/2020 Genetic Testing   Negative hereditary cancer genetic testing: no pathogenic variants detected in [name].  The report date is October 01, 2020.    The BRCAplus panel offered by [employer] and includes sequencing and deletion/duplication analysis for the following 8 genes: ATM, BRCA1, BRCA2, CDH1, CHEK2, PALB2, PTEN, and TP53.  Results of pan-cancer panel are pending.       HISTORY  OF PRESENTING ILLNESS: Ambulating independently; accompanied by her mother KIMYA MCCAHILL 40 y.o.  female female with no prior history of breast cancer/or malignancies has been referred to [place] for further evaluation recommendations for new diagnosis of breast cancer.   Patient states she was found to have an abnormal screening mammogram which led to diagnostic mammogram/ultrasound/followed by biopsy-as summarized above.  In the interim patient underwent-evaluation with Dr. Barry Dienes at [facility] surgery in [place].  She also underwent MRI of the breast-patient is awaiting biopsy of the left breast lesions as noted above.  Grandparents  cancers; otherwise no history of any malignancies at a younger age.   Patient has chronic history of headaches/migraines; anxiety.  Otherwise no unusual headaches or chest pain or shortness of breath or cough.  Review of Systems  Constitutional:  Negative for chills, diaphoresis, fever, malaise/fatigue and weight loss.  HENT:  Negative for nosebleeds and sore throat.   Eyes:  Negative for double vision.  Respiratory:  Negative for cough, hemoptysis, sputum production, shortness of breath and wheezing.   Cardiovascular:  Negative for chest pain, palpitations, orthopnea and leg swelling.  Gastrointestinal:  Negative for abdominal pain, blood in stool, constipation, diarrhea, heartburn, melena, nausea and vomiting.  Genitourinary:  Negative for dysuria, frequency and urgency.  Musculoskeletal:  Negative for back pain and joint pain.  Skin: Negative.  Negative for itching and rash.  Neurological:  Positive for headaches. Negative for dizziness, tingling, focal weakness and weakness.  Endo/Heme/Allergies:  Does not bruise/bleed easily.  Psychiatric/Behavioral:  Negative for depression. The patient is nervous/anxious. The patient does not have insomnia.   All other systems reviewed and are negative.   MEDICAL HISTORY:  Past Medical History:  Diagnosis Date  .  Acid reflux   . Anxiety and depression    Dr. Ailene Ravel  Luana Shu, MD  . Back pain   . Family history of breast cancer 09/21/2020  . Family history of malignant neoplasm of digestive organ 09/21/2020  . Fibromyalgia   . History of gallstones   . Malignant neoplasm of upper-inner quadrant of right female breast ([place]) 09/21/2020  . Migraine     SURGICAL HISTORY: Past Surgical History:  Procedure Laterality Date  . CHOLECYSTECTOMY  12/2004    SOCIAL HISTORY: Social History   Socioeconomic History  . Marital status: Divorced    Spouse name: Corene Cornea  . Number of children: 0  . Years of education: MS  . Highest education level: Not on file  Occupational History  . Occupation: Optometrist  Tobacco Use  . Smoking status: Never  . Smokeless tobacco: Never  Substance and Sexual Activity  . Alcohol use: No  . Drug use: No  . Sexual activity: Not on file  Other Topics Concern  . Not on file  Social History Narrative   Accountant for commercial firm; wants short term disability;  never smoked; 2 drinks a month. Lives in [place] with boy friend [[place] No children. Has one brother    Social Determinants of Radio broadcast assistant Strain: Not on file  Food Insecurity: Not on file  Transportation Needs: Not on file  Physical Activity: Not on file  Stress: Not on file  Social Connections: Not on file  Intimate Partner Violence: Not on file    FAMILY HISTORY: Family History  Problem Relation Age of Onset  . Healthy Mother   . Hypertension Father   . Skin cancer Brother        dx before 61  . Colon cancer Maternal Aunt 54  . Cervical cancer Maternal Aunt        dx before 84; x2  . Cancer Paternal Grandmother 99       unknown primary; ? liver  . Lung cancer Paternal Grandfather        dx after 9  . Breast cancer Cousin 40       maternal cousin  . Colon cancer Other        MGM's sister  . Gastric cancer Other        PGM's sister  . Leukemia Other         PMG's sister  . Rectal cancer Neg Hx   . Esophageal cancer Neg Hx     ALLERGIES:  is allergic to diphenhydramine hcl, promethazine, and pseudoeph-doxylamine-dm-apap.  MEDICATIONS:  Current Outpatient Medications  Medication Sig Dispense Refill  . almotriptan (AXERT) 12.5 MG tablet Take 1 tab at onset of migraine.  May repeat in 2 hrs, if needed.  Max dose: 2 tabs/day. This is a 30 day prescription. 12 tablet 11  . azelastine (ASTELIN) 0.1 % nasal spray Place 1 spray into both nostrils daily as needed for rhinitis.    Marland Kitchen buPROPion (WELLBUTRIN XL) 300 MG 24 hr tablet Take 300 mg by mouth every morning.    . butalbital-acetaminophen-caffeine (FIORICET) 50-325-40 MG tablet Take 1 tablet by mouth every 8 (eight) hours as needed for headache. 10 tablet 3  . clonazePAM (KLONOPIN) 1 MG tablet Take 0.5-1 mg by mouth 2 (two) times daily as needed for anxiety.    . diazepam (VALIUM) 5 MG tablet One pill 45-60 mins prior to procedure; 1 pill 15 mins prior if needed. 6 tablet 0  . ondansetron (ZOFRAN) 8 MG tablet Take 1 tablet (8 mg total) by mouth every  8 (eight) hours as needed for nausea or vomiting. 30 tablet 3  . Rimegepant Sulfate (NURTEC) 75 MG TBDP Take 75 mg by mouth as needed (Take 1 at onset of headache, max is 75 mg in 24 hours). 8 tablet 11  . tiZANidine (ZANAFLEX) 4 MG tablet Take 1 tablet (4 mg total) by mouth every 8 (eight) hours as needed for muscle spasms (migraines). This is a 30 day rx. 20 tablet 5  . topiramate (TOPAMAX) 100 MG tablet Take 1 tablet (100 mg total) by mouth 2 (two) times daily. (Patient taking differently: Take 200 mg by mouth at bedtime.) 60 tablet 11  . traMADol (ULTRAM) 50 MG tablet Take 50 mg by mouth every 6 (six) hours as needed for pain.    Marland Kitchen vortioxetine HBr (TRINTELLIX) 20 MG TABS tablet Take 20 mg by mouth daily.  0  . zolpidem (AMBIEN) 10 MG tablet Take 10 mg by mouth at bedtime as needed for sleep.    Marland Kitchen BOTOX 200 units SOLR Inject 200 Units into the skin See  admin instructions. Every 90 days    . EMGALITY 120 MG/ML SOAJ Inject 120 mg into the skin every 30 (thirty) days.    Marland Kitchen omeprazole (PRILOSEC) 20 MG capsule Take 20 mg by mouth daily.    Marland Kitchen OVER THE COUNTER MEDICATION Take 1 tablet by mouth daily. prevent a migraine    . rosuvastatin (CRESTOR) 20 MG tablet Take 20 mg by mouth daily.     No current facility-administered medications for this visit.      Marland Kitchen  PHYSICAL EXAMINATION: ECOG PERFORMANCE STATUS: 0 - Asymptomatic  Vitals:   10/03/20 0843  BP: 130/68  Pulse: 98  Resp: 16  Temp: 99.9 F (37.7 C)  SpO2: 100%   Filed Weights   10/03/20 0843  Weight: 179 lb 1 oz (81.2 kg)    Physical Exam Vitals and nursing note reviewed.  HENT:     Head: Normocephalic and atraumatic.     Mouth/Throat:     Pharynx: Oropharynx is clear.  Eyes:     Extraocular Movements: Extraocular movements intact.     Pupils: Pupils are equal, round, and reactive to light.  Cardiovascular:     Rate and Rhythm: Normal rate and regular rhythm.  Abdominal:     Palpations: Abdomen is soft.  Musculoskeletal:        General: Normal range of motion.     Cervical back: Normal range of motion.  Skin:    General: Skin is warm.  Neurological:     General: No focal deficit present.     Mental Status: She is alert and oriented to person, place, and time.  Psychiatric:        Behavior: Behavior normal.        Judgment: Judgment normal.     LABORATORY DATA:  I have reviewed the data as listed Lab Results  Component Value Date   WBC 13.9 (H) 01/24/2020   HGB 13.5 01/24/2020   HCT 40.9 01/24/2020   MCV 91.5 01/24/2020   PLT 289 01/24/2020   Recent Labs    01/24/20 1620  NA 136  K 3.6  CL 105  CO2 19*  GLUCOSE 142*  BUN 8  CREATININE 0.71  CALCIUM 8.7*  GFRNONAA >60    RADIOGRAPHIC STUDIES: I have personally reviewed the radiological images as listed and agreed with the findings in the report. MR BREAST BILATERAL W WO CONTRAST INC  CAD  Result Date: 09/21/2020 CLINICAL DATA:  Recently diagnosed invasive ductal carcinoma of the RIGHT breast, retroareolar, 2 o'clock (ribbon clip). LABS:  Not performed at imaging center. EXAM: BILATERAL BREAST MRI WITH AND WITHOUT CONTRAST TECHNIQUE: Multiplanar, multisequence MR images of both breasts were obtained prior to and following the intravenous administration of 7 ml of Gadavist Three-dimensional MR images were rendered by post-processing of the original MR data on an independent workstation. The three-dimensional MR images were interpreted, and findings are reported in the following complete MRI report for this study. Three dimensional images were evaluated at the independent interpreting workstation using the DynaCAD thin client. COMPARISON:  No previous breast MRI. FINDINGS: Breast composition: c. Heterogeneous fibroglandular tissue. Background parenchymal enhancement: Moderate. Right breast: Biopsy clip within the inner RIGHT breast, at anterior depth, corresponding to the known invasive carcinoma. Associated mass at the biopsy site measures 1.1 cm. Surrounding non-mass enhancement extends the overall measurement to 3.1 x 2.4 x 3.1 cm. No additional suspicious enhancing mass, non-mass enhancement or secondary signs of malignancy within the RIGHT breast. Left breast: Irregular enhancing mass within retroareolar LEFT breast, measuring 1 cm, with persistent enhancement kinetics (series 9, images 77 and 78). Oval circumscribed ring enhancing mass within the upper-outer quadrant of the LEFT breast, measuring 8 mm, with mixed enhancement kinetics including plateau (series 9, image 57). Lymph nodes: No abnormal appearing lymph nodes. Ancillary findings:  None. IMPRESSION: 1. Irregular enhancing mass within the retroareolar LEFT breast, measuring 1 cm (series 9, image 78), a suspicious finding. Recommend MRI-guided biopsy to exclude contralateral disease. 2. Additional oval circumscribed ring-enhancing  mass within the upper-outer quadrant of the LEFT breast, measuring 8 mm (series 9, image 57), a suspicious finding, possibly a benign intramammary lymph node. MRI-guided biopsy is recommended to exclude contralateral disease. 3. Biopsy-proven malignancy within the inner RIGHT breast, at anterior depth, 2 o'clock axis per previous ultrasound (ribbon clip). Associated mass at the biopsy site measures 1.1 cm, however, additional surrounding non-mass enhancement extends the overall measurement to 3.1 cm greatest dimension. 4. No evidence of additional multifocal or multicentric disease within the RIGHT breast. 5. No evidence of metastatic lymphadenopathy. RECOMMENDATION: 1. MRI-guided biopsy for the irregular enhancing mass within the retroareolar LEFT breast, measuring 1 cm. 2. MRI-guided biopsy of the additional oval circumscribed ring enhancing mass within the upper-outer quadrant of the LEFT breast, measuring 8 mm. BI-RADS CATEGORY  4: Suspicious. Electronically Signed   By: Franki Cabot M.D.   On: 09/21/2020 09:11  MM CLIP PLACEMENT LEFT  Result Date: 10/04/2020 CLINICAL DATA:  Evaluate biopsy markers after MRI guided biopsies. EXAM: 3D DIAGNOSTIC LEFT MAMMOGRAM POST MRI BIOPSY COMPARISON:  Previous exam(s). FINDINGS: 3D Mammographic images were obtained following MRI guided biopsy of 2 left breast masses. The cylinder shaped clip was placed at the site of the upper outer left breast mass. This clip appears to have migrated 1.1 cm laterally. The dumbbell shaped clip was placed at the site of the retroareolar mass. This clip appears to be in adequate position. IMPRESSION: The dumbbell shaped clip marks the site of the biopsied retroareolar mass and appears to be in good position. The cylinder shaped clip was placed at the site of the upper-outer left breast mass which is biopsy. This clip appears to have migrated approximately 1.1 cm laterally. Final Assessment: Post Procedure Mammograms for Marker Placement  Electronically Signed   By: Dorise Bullion III M.D.   On: 10/04/2020 10:40  MM CLIP PLACEMENT RIGHT  Result Date: 09/12/2020 CLINICAL DATA:  Post biopsy mammogram of the  right breast for clip placement. EXAM: 3D DIAGNOSTIC RIGHT MAMMOGRAM POST ULTRASOUND BIOPSY COMPARISON:  Previous exam(s). FINDINGS: 3D Mammographic images were obtained following ultrasound guided biopsy of a retroareolar right breast mass at 2 o'clock. The biopsy marking clip is in expected position at the site of biopsy. IMPRESSION: Appropriate positioning of the ribbon shaped biopsy marking clip at the site of biopsy in the medial retroareolar right breast. Final Assessment: Post Procedure Mammograms for Marker Placement Electronically Signed   By: Ammie Ferrier M.D.   On: 09/12/2020 09:23  MR LT BREAST BX W LOC DEV 1ST LESION IMAGE BX SPEC MR GUIDE  Addendum Date: 10/08/2020   ADDENDUM REPORT: 10/08/2020 09:49 ADDENDUM: Pathology revealed FIBROCYSTIC CHANGES- NO ATYPIA OR MALIGNANCY of the LEFT breast, upper outer, cylinder clip. This was found to be concordant by Dr. Dorise Bullion. Pathology revealed FIBROCYSTIC CHANGES- NO ATYPIA OR MALIGNANCY of the LEFT breast, retroareolar, dumbbell clip. This was found to be concordant by Dr. Dorise Bullion. Pathology results were discussed with the patient by telephone. The patient reported doing well after the biopsies with tenderness at the sites. Post biopsy instructions and care were reviewed and questions were answered. The patient was encouraged to call [facility] for any additional concerns. Bilateral breast MRI to follow LEFT recommended in 6 months per protocol. The patient has a recent diagnosis of RIGHT breast cancer and should follow her outlined treatment plan. Pathology results reported by Stacie Acres RN on 10/08/2020. Electronically Signed   By: Dorise Bullion III M.D.   On: 10/08/2020 09:49   Result Date: 10/08/2020 CLINICAL DATA:  Biopsy of 2  masses in the left breast EXAM: MRI GUIDED CORE NEEDLE BIOPSY OF THE LEFT BREAST TECHNIQUE: Multiplanar, multisequence MR imaging of the left breast was performed both before and after administration of intravenous contrast. CONTRAST:  44m GADAVIST GADOBUTROL 1 MMOL/ML IV SOLN COMPARISON:  Previous exams. FINDINGS: I met with the patient, and we discussed the procedure of MRI guided biopsy, including risks, benefits, and alternatives. Specifically, we discussed the risks of infection, bleeding, tissue injury, clip migration, and inadequate sampling. Informed, written consent was given. The usual time out protocol was performed immediately prior to the procedure. Using sterile technique, 1% Lidocaine, MRI guidance, and a 9 gauge vacuum assisted device, biopsy was performed of the mass in the upper-outer left breast using a lateral approach. At the conclusion of the procedure, a cylinder shaped tissue marker clip was deployed into the biopsy cavity. Follow-up 2-view mammogram was performed and dictated separately. Using sterile technique, 1% Lidocaine, MRI guidance, and a 9 gauge vacuum assisted device, biopsy was performed of the mass in the retroareolar region of the left breast using a lateral approach. At the conclusion of the procedure, a dumbbell shaped tissue marker clip was deployed into the biopsy cavity. Follow-up 2-view mammogram was performed and dictated separately. IMPRESSION: MRI guided biopsy of 2 left breast masses as above. No apparent complications. Electronically Signed: By: DDorise BullionIII M.D. On: 10/04/2020 10:26   MR LT BREAST BX W LOC DEV EA ADD LESION IMAGE BX SPEC MR GUIDE  Addendum Date: 10/08/2020   ADDENDUM REPORT: 10/08/2020 09:49 ADDENDUM: Pathology revealed FIBROCYSTIC CHANGES- NO ATYPIA OR MALIGNANCY of the LEFT breast, upper outer, cylinder clip. This was found to be concordant by Dr. DDorise Bullion Pathology revealed FIBROCYSTIC CHANGES- NO ATYPIA OR MALIGNANCY of the LEFT  breast, retroareolar, dumbbell clip. This was found to be concordant by Dr. DDorise Bullion  Pathology results were discussed with the patient by telephone. The patient reported doing well after the biopsies with tenderness at the sites. Post biopsy instructions and care were reviewed and questions were answered. The patient was encouraged to call [facility] for any additional concerns. Bilateral breast MRI to follow LEFT recommended in 6 months per protocol. The patient has a recent diagnosis of RIGHT breast cancer and should follow her outlined treatment plan. Pathology results reported by Stacie Acres RN on 10/08/2020. Electronically Signed   By: Dorise Bullion III M.D.   On: 10/08/2020 09:49   Result Date: 10/08/2020 CLINICAL DATA:  Biopsy of 2 masses in the left breast EXAM: MRI GUIDED CORE NEEDLE BIOPSY OF THE LEFT BREAST TECHNIQUE: Multiplanar, multisequence MR imaging of the left breast was performed both before and after administration of intravenous contrast. CONTRAST:  56m GADAVIST GADOBUTROL 1 MMOL/ML IV SOLN COMPARISON:  Previous exams. FINDINGS: I met with the patient, and we discussed the procedure of MRI guided biopsy, including risks, benefits, and alternatives. Specifically, we discussed the risks of infection, bleeding, tissue injury, clip migration, and inadequate sampling. Informed, written consent was given. The usual time out protocol was performed immediately prior to the procedure. Using sterile technique, 1% Lidocaine, MRI guidance, and a 9 gauge vacuum assisted device, biopsy was performed of the mass in the upper-outer left breast using a lateral approach. At the conclusion of the procedure, a cylinder shaped tissue marker clip was deployed into the biopsy cavity. Follow-up 2-view mammogram was performed and dictated separately. Using sterile technique, 1% Lidocaine, MRI guidance, and a 9 gauge vacuum assisted device, biopsy was performed of the mass in the  retroareolar region of the left breast using a lateral approach. At the conclusion of the procedure, a dumbbell shaped tissue marker clip was deployed into the biopsy cavity. Follow-up 2-view mammogram was performed and dictated separately. IMPRESSION: MRI guided biopsy of 2 left breast masses as above. No apparent complications. Electronically Signed: By: DDorise BullionIII M.D. On: 10/04/2020 10:26   [place] BREAST BX W LOC DEV 1ST LESION IMG BX SPEC [place]  Addendum Date: 09/13/2020   ADDENDUM REPORT: 09/13/2020 14:44 ADDENDUM: Pathology revealed GRADE II INVASIVE DUCTAL CARCINOMA of the Right breast, retroareolar, 2 o'clock, (ribbon clip). This was found to be concordant by Dr. MAmmie Ferrier Pathology results were discussed with the patient by telephone. The patient reported doing well after the biopsy with tenderness at the site. Post biopsy instructions and care were reviewed and questions were answered. The [facility]. My direct phone number was provided. Surgical consultation has been arranged with Dr. FStark Klein per patient request, [facility] on September 18, 2020. Recommendation for a bilateral breast MRI given heterogeneously dense breast tissue and age. Pathology results reported by LTerie Purser RN on 09/13/2020. Electronically Signed   By: MAmmie FerrierM.D.   On: 09/13/2020 14:44   Result Date: 09/13/2020 CLINICAL DATA:  40year old female presenting for ultrasound-guided biopsy of a right breast mass. EXAM: ULTRASOUND GUIDED RIGHT BREAST CORE NEEDLE BIOPSY COMPARISON:  Previous exam(s). PROCEDURE: I met with the patient and we discussed the procedure of ultrasound-guided biopsy, including benefits and alternatives. We discussed the high likelihood of a successful procedure. We discussed the risks of the procedure, including infection, bleeding, tissue injury,  clip migration, and inadequate sampling. Informed written consent was given. The usual  time-out protocol was performed immediately prior to the procedure. Lesion quadrant: Upper inner quadrant Using sterile technique and 1% Lidocaine as local anesthetic, under direct ultrasound visualization, a 14 gauge spring-loaded device was used to perform biopsy of a retroareolar right breast mass at 2 o'clock using an inferomedial approach. At the conclusion of the procedure a ribbon shaped tissue marker clip was deployed into the biopsy cavity. Follow up 2 view mammogram was performed and dictated separately. IMPRESSION: Ultrasound guided biopsy of a retroareolar right breast mass at 2 o'clock. No apparent complications. Electronically Signed: By: Ammie Ferrier M.D. On: 09/12/2020 09:09   ASSESSMENT & PLAN:   Carcinoma of upper-inner quadrant of right breast in female, estrogen receptor positive ([place]) #Right breast-invasive mammary carcinoma 2 o'clock position #1.1 cm-ER/PR HER2/neu positive; SEP, 2022-MRI-up to 3 cm non-mass enhancement noted.  #Left breast- MRI #upper outer quadrant- 48m #retroareolar 1cm-. Patient awaiting biopsy of the left breast lesions.   # I had a long discussion with the patient in general regarding the treatment options of breast cancer including-surgery; adjuvant radiation; role of neo/adjuvant systemic therapy including-chemotherapy followed by adjuvant antihormone therapy.  #Given HER2/neu positive disease-patient will need chemotherapy including HER2 directed therapy.  However, the type of HER2 directed therapy [adjuvant Taxol [name] versus neoadjuvant TCH plus P] will be based on the results of contralateral breast biopsy/and also based on contrast nonenhancing lesion noted around the right breast lesion. Will discuss with Dr.Byerly.   # Headaches/Migraines-on Fioricet/amitriptyline; and also Emgalti SQ. no clinical  for malignancy.  #Anxiety: Prescribed Valium prior to  MRI biopsy.   #Genetic testing-s/p genetic counseling-limited panel negative.  Expanded panel pending.  Thank you Dr.Byerly for allowing me to participate in the care of your pleasant patient. Please do not hesitate to contact me with questions or concerns in the interim.  # DISPOSITION: # No labs today # follow up in 1 week- No Labs-Dr.B    All questions were answered. The patient/family knows to call the clinic with any problems, questions or concerns.       GCammie Sickle MD 10/09/2020 4:57 PM

## 2020-10-03 NOTE — Assessment & Plan Note (Addendum)
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  #Left breast- MRI #upper outer quadrant- 75m #retroareolar 1cm-. Patient awaiting biopsy of the left breast lesions.   # I had a long discussion with the patient in general regarding the treatment options of breast cancer including-surgery; adjuvant radiation; role of neo/adjuvant systemic therapy including-chemotherapy followed by adjuvant antihormone therapy.  #Given HER2/neu positive disease-patient will need chemotherapy including HER2 directed therapy.  However, the type of HER2 directed therapy [adjuvant Taxol [name] versus neoadjuvant TCH plus P] will be based on the results of contralateral breast biopsy/and also based on contrast nonenhancing lesion noted around the right breast lesion. Will discuss with Dr.Byerly.   # Headaches/Migraines-on Fioricet/amitriptyline; and also Emgalti SQ. no clinical  for malignancy.  #Anxiety: Prescribed Valium prior to MRI biopsy.   #Genetic testing-s/p genetic counseling-limited panel negative.  Expanded panel pending.  Thank you Dr.Byerly for allowing me to participate in the care of your pleasant patient. Please do not hesitate to contact me with questions or concerns in the interim.  # DISPOSITION: # No labs today # follow up in 1 week- No Labs-Dr.B

## 2020-10-04 ENCOUNTER — Ambulatory Visit
Admission: RE | Admit: 2020-10-04 | Discharge: 2020-10-04 | Disposition: A | Discharge status: Home / Self Care | Payer: No Typology Code available for payment source | Source: Ambulatory Visit | Attending: General Surgery | Admitting: General Surgery

## 2020-10-04 ENCOUNTER — Other Ambulatory Visit: Payer: Self-pay | Admitting: Radiology

## 2020-10-04 ENCOUNTER — Other Ambulatory Visit: Payer: Self-pay

## 2020-10-04 DIAGNOSIS — Z170 Estrogen receptor positive status [ER+]: Secondary | ICD-10-CM

## 2020-10-04 DIAGNOSIS — C50211 Malignant neoplasm of upper-inner quadrant of right female breast: Secondary | ICD-10-CM

## 2020-10-04 MED ORDER — GADOBUTROL 1 MMOL/ML IV SOLN
8.0000 mL | Freq: Once | INTRAVENOUS | Status: AC | PRN
  Administered 2020-10-04: 8 mL via INTRAVENOUS

## 2020-10-08 ENCOUNTER — Encounter: Payer: Self-pay | Admitting: Internal Medicine

## 2020-10-08 ENCOUNTER — Telehealth: Payer: Self-pay | Admitting: *Deleted

## 2020-10-08 NOTE — Telephone Encounter (Signed)
Incoming fax for short term disability received 10/03/20 at 1526.  Dr. Jacinto Reap- please advise on the plan of care and patient's restrictions. (Pt has an apt 9/21).

## 2020-10-09 ENCOUNTER — Other Ambulatory Visit: Payer: Self-pay | Admitting: Internal Medicine

## 2020-10-09 NOTE — Progress Notes (Signed)
Breast conference added for 9/26.   GB

## 2020-10-09 NOTE — Pre-Procedure Instructions (Signed)
Surgical Instructions    Your procedure is scheduled on Wednesday, September 28th, 2022.  Report to [facility] Main Entrance "A" at 07:00 A.M., then check in with the Admitting office.  Call this number if you have problems the morning of surgery:  [phone]   If you have any questions prior to your surgery date call [phone]: Open Monday-Friday 8am-4pm    Remember:  Do not eat after midnight the night before your surgery  You may drink clear liquids until 06:00 A.M. the morning of your surgery.   Clear liquids allowed are: Water, Non-Citrus Juices (without pulp), Carbonated Beverages, Clear Tea, Black Coffee ONLY (NO MILK, CREAM OR POWDERED CREAMER of any kind), and Gatorade   Enhanced Recovery after Surgery for Orthopedics Enhanced Recovery after Surgery is a protocol used to improve the stress on your body and your recovery after surgery.  Patient Instructions  The day of surgery (if you do NOT have diabetes):  Drink ONE (1) Pre-Surgery Clear Ensure by _____ am the morning of surgery   This drink was given to you during your hospital  pre-op appointment visit. Nothing else to drink after completing the  Pre-Surgery Clear Ensure.         If you have questions, please contact your surgeon's office.      Take these medicines the morning of surgery with A SIP OF WATER: buPROPion (WELLBUTRIN XL)  omeprazole (PRILOSEC)   rosuvastatin (CRESTOR) vortioxetine HBr (TRINTELLIX)    Take these medicines the morning of surgery with A SIP OF WATER AS NEEDED: almotriptan (AXERT)  azelastine (ASTELIN) clonazePAM (KLONOPIN)  diazepam (VALIUM)  ondansetron (ZOFRAN)  Rimegepant Sulfate (NURTEC) tiZANidine (ZANAFLEX) traMADol (ULTRAM)   As of today, STOP taking any Aspirin (unless otherwise instructed by your surgeon) Aleve, Naproxen, Ibuprofen, Motrin, Advil, Goody's, BC's, all herbal medications, fish oil, and all vitamins.          Do not wear jewelry or makeup Do not  wear lotions, powders, perfumes, or deodorant. Do not shave 48 hours prior to surgery.   Do not bring valuables to the hospital. DO Not wear nail polish, gel polish, artificial nails, or any other type of covering on natural nails including finger and toenails. If patients have artificial nails, gel coating, etc. that need to be removed by a nail salon please have this removed prior to surgery or surgery may need to be canceled/delayed if the surgeon/ anesthesia feels like the patient is unable to be adequately monitored.             [facility] is not responsible for any belongings or valuables.  Do NOT Smoke (Tobacco/Vaping)  24 hours prior to your procedure If you use a CPAP at night, you may bring your mask for your overnight stay.   Contacts, glasses, dentures or bridgework may not be worn into surgery, please bring cases for these belongings   For patients admitted to the hospital, discharge time will be determined by your treatment team.   Patients discharged the day of surgery will not be allowed to drive home, and someone needs to stay with them for 24 hours.  NO VISITORS WILL BE ALLOWED IN PRE-OP WHERE PATIENTS GET READY FOR SURGERY.  ONLY 1 SUPPORT PERSON MAY BE PRESENT WHILE YOU ARE IN SURGERY.  IF YOU ARE TO BE ADMITTED, ONCE YOU ARE IN YOUR ROOM YOU WILL BE ALLOWED TWO (2) VISITORS.  Minor children may have two parents present. Special consideration for safety and communication needs will  be reviewed on a case by case basis.  Special instructions:    Oral Hygiene is also important to reduce your risk of infection.  Remember - BRUSH YOUR TEETH THE MORNING OF SURGERY WITH YOUR REGULAR TOOTHPASTE   [place]- Preparing For Surgery  Before surgery, you can play an important role. Because skin is not sterile, your skin needs to be as free of germs as possible. You can reduce the number of germs on your skin by washing with CHG (chlorahexidine gluconate) Soap before surgery.  CHG  is an antiseptic cleaner which kills germs and bonds with the skin to continue killing germs even after washing.     Please do not use if you have an allergy to CHG or antibacterial soaps. If your skin becomes reddened/irritated stop using the CHG.  Do not shave (including legs and underarms) for at least 48 hours prior to first CHG shower. It is OK to shave your face.  Please follow these instructions carefully.     Shower the NIGHT BEFORE SURGERY and the MORNING OF SURGERY with CHG Soap.   If you chose to wash your hair, wash your hair first as usual with your normal shampoo. After you shampoo, rinse your hair and body thoroughly to remove the shampoo.  Then [employer] and genitals (private parts) with your normal soap and rinse thoroughly to remove soap.  After that Use CHG Soap as you would any other liquid soap. You can apply CHG directly to the skin and wash gently with a scrungie or a clean washcloth.   Apply the CHG Soap to your body ONLY FROM THE NECK DOWN.  Do not use on open wounds or open sores. Avoid contact with your eyes, ears, mouth and genitals (private parts). Wash Face and genitals (private parts)  with your normal soap.   Wash thoroughly, paying special attention to the area where your surgery will be performed.  Thoroughly rinse your body with warm water from the neck down.  DO NOT shower/wash with your normal soap after using and rinsing off the CHG Soap.  Pat yourself dry with a CLEAN TOWEL.  Wear CLEAN PAJAMAS to bed the night before surgery  Place CLEAN SHEETS on your bed the night before your surgery  DO NOT SLEEP WITH PETS.   Day of Surgery:  Take a shower with CHG soap. Wear Clean/Comfortable clothing the morning of surgery Do not apply any deodorants/lotions.   Remember to brush your teeth WITH YOUR REGULAR TOOTHPASTE.   Please read over the following fact sheets that you were given.

## 2020-10-10 ENCOUNTER — Other Ambulatory Visit: Payer: Self-pay

## 2020-10-10 ENCOUNTER — Ambulatory Visit: Payer: No Typology Code available for payment source | Admitting: Internal Medicine

## 2020-10-10 ENCOUNTER — Encounter (HOSPITAL_COMMUNITY): Payer: Self-pay

## 2020-10-10 ENCOUNTER — Encounter (HOSPITAL_COMMUNITY)
Admission: RE | Admit: 2020-10-10 | Discharge: 2020-10-10 | Disposition: A | Discharge status: Home / Self Care | Payer: No Typology Code available for payment source | Source: Ambulatory Visit | Attending: General Surgery | Admitting: General Surgery

## 2020-10-10 DIAGNOSIS — Z01812 Encounter for preprocedural laboratory examination: Secondary | ICD-10-CM | POA: Insufficient documentation

## 2020-10-10 LAB — POCT PREGNANCY, URINE: Preg Test, Ur: NEGATIVE

## 2020-10-10 LAB — BASIC METABOLIC PANEL
Anion gap: 8 (ref 5–15)
BUN: 9 mg/dL (ref 6–20)
CO2: 23 mmol/L (ref 22–32)
Calcium: 9.2 mg/dL (ref 8.9–10.3)
Chloride: 105 mmol/L (ref 98–111)
Creatinine, Ser: 0.86 mg/dL (ref 0.44–1.00)
GFR, Estimated: >60 mL/min (ref >60)
Glucose, Bld: 107 mg/dL — ABNORMAL HIGH (ref 70–99)
Potassium: 3.7 mmol/L (ref 3.5–5.1)
Sodium: 136 mmol/L (ref 135–145)

## 2020-10-10 LAB — CBC
HCT: 46.3 % — ABNORMAL HIGH (ref 36.0–46.0)
Hemoglobin: 15.1 g/dL — ABNORMAL HIGH (ref 12.0–15.0)
MCH: 30.1 pg (ref 26.0–34.0)
MCHC: 32.6 g/dL (ref 30.0–36.0)
MCV: 92.4 fL (ref 80.0–100.0)
Platelets: 419 10*3/uL — ABNORMAL HIGH (ref 150–400)
RBC: 5.01 MIL/uL (ref 3.87–5.11)
RDW: 12.5 % (ref 11.5–15.5)
WBC: 7.9 10*3/uL (ref 4.0–10.5)
nRBC: 0 % (ref 0.0–0.2)

## 2020-10-10 NOTE — Progress Notes (Signed)
PCP - Dr. [name] Cardiologist - denies  PPM/ICD - denies   Chest x-ray - 01/24/20 EKG - 01/25/20 Stress Test - denies ECHO - denies Cardiac Cath - denies  Sleep Study - denies   DM: denies  Blood Thinner Instructions: n/a Aspirin Instructions: n/a  ERAS Protcol - Yes PRE-SURGERY Ensure   COVID TEST- n/a, ambulatory surgery   Anesthesia review: No  Patient denies shortness of breath, fever, cough and chest pain at PAT appointment   All instructions explained to the patient, with a verbal understanding of the material. Patient agrees to go over the instructions while at home for a better understanding. The opportunity to ask questions was provided.

## 2020-10-12 ENCOUNTER — Encounter: Payer: Self-pay | Admitting: Internal Medicine

## 2020-10-12 ENCOUNTER — Inpatient Hospital Stay (HOSPITAL_BASED_OUTPATIENT_CLINIC_OR_DEPARTMENT_OTHER): Payer: No Typology Code available for payment source | Admitting: Internal Medicine

## 2020-10-12 ENCOUNTER — Ambulatory Visit: Payer: Self-pay | Admitting: Genetic Counselor

## 2020-10-12 ENCOUNTER — Telehealth: Payer: Self-pay | Admitting: Genetic Counselor

## 2020-10-12 VITALS — BP 119/89 | HR 91 | Temp 98.4°F | Resp 16 | Wt 180.3 lb

## 2020-10-12 DIAGNOSIS — Z800 Family history of malignant neoplasm of digestive organs: Secondary | ICD-10-CM

## 2020-10-12 DIAGNOSIS — Z1379 Encounter for other screening for genetic and chromosomal anomalies: Secondary | ICD-10-CM

## 2020-10-12 DIAGNOSIS — Z9221 Personal history of antineoplastic chemotherapy: Secondary | ICD-10-CM

## 2020-10-12 DIAGNOSIS — C50211 Malignant neoplasm of upper-inner quadrant of right female breast: Secondary | ICD-10-CM | POA: Diagnosis not present

## 2020-10-12 DIAGNOSIS — Z170 Estrogen receptor positive status [ER+]: Secondary | ICD-10-CM

## 2020-10-12 DIAGNOSIS — Z803 Family history of malignant neoplasm of breast: Secondary | ICD-10-CM

## 2020-10-12 NOTE — Progress Notes (Signed)
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  #Left breast- MRI #upper outer quadrant- 31m #retroareolar 1cm-. NEGATIVE for malignancy  ---------------------------------   # Headaches-Migraine/ Anxiety [[name]; Dr.Yan  botox]   Carcinoma of upper-inner quadrant of right breast in [place], [place]  09/21/2020 Initial Diagnosis   Malignant neoplasm of upper-inner quadrant of right female breast ([facility]   10/01/2020 Genetic Testing   Negative hereditary cancer genetic testing: no pathogenic variants detected in [name] and [name].  The report dates are October 01, 2020 and October 11, 2020, respectively.    The BRCAplus panel offered by [name] includes sequencing and deletion/duplication analysis for the following 8 genes: ATM, BRCA1, BRCA2, CDH1, CHEK2, PALB2, PTEN, and TP53.  The CustomNext-Cancer+RNAinsight panel offered by [facility] sequencing and rearrangement analysis for the following 47 genes:  APC, ATM, AXIN2, BARD1, BMPR1A, BRCA1, BRCA2, BRIP1, CDH1, CDK4, CDKN2A, CHEK2, DICER1, EPCAM, GREM1, HOXB13, MEN1, MLH1, MSH2, MSH3, MSH6, MUTYH, NBN, NF1, NF2, NTHL1, PALB2, PMS2, POLD1, POLE, PTEN, RAD51C, RAD51D, RECQL, RET, SDHA, SDHAF2, SDHB, SDHC, SDHD, SMAD4, SMARCA4, STK11, TP53, TSC1, TSC2, and VHL.  RNA data is routinely analyzed for use in variant interpretation for all genes.      HISTORY OF PRESENTING ILLNESS: Ambulating independently; accompanied by her mother KAZUCENA [id number].  female diagnosed with right-sided breast cancer is here to review the results of the  bilateral MRI breast/also left breast biopsy.  Patient is feeling well from the biopsy.  Noticed to have a small rash at the site of left breast biopsy.  Patient tolerated very well biopsy with Valium.   Review of Systems  Constitutional:  Negative for chills, diaphoresis, fever, malaise/fatigue and weight loss.  HENT:  Negative for nosebleeds and sore throat.   Eyes:  Negative for double vision.  Respiratory:  Negative for cough, hemoptysis, sputum production, shortness of breath and wheezing.   Cardiovascular:  Negative for chest pain, palpitations, orthopnea and leg swelling.  Gastrointestinal:  Negative for abdominal pain, blood in stool, constipation, diarrhea, heartburn, melena, nausea and vomiting.  Genitourinary:  Negative for dysuria, frequency and urgency.  Musculoskeletal:  Negative for back pain and joint pain.  Skin: Negative.  Negative for itching and rash.  Neurological:  Positive for headaches. Negative for dizziness, tingling, focal weakness and weakness.  Endo/Heme/Allergies:  Does not bruise/bleed easily.  Psychiatric/Behavioral:  Negative for depression. The patient is nervous/anxious. The patient does not have insomnia.   All other systems reviewed and are negative.   MEDICAL HISTORY:  Past Medical History:  Diagnosis Date   Acid reflux    Anxiety and depression    Dr. MGala Murdoch MD   Back pain    Family history of breast cancer 09/21/2020   Family history of malignant neoplasm of digestive organ 09/21/2020   Fibromyalgia    History of gallstones    Malignant [place] of [place]inner quadrant of right [place] breast [place]   Migraine     SURGICAL HISTORY: Past Surgical History:  Procedure Laterality Date   CHOLECYSTECTOMY  12/2004    SOCIAL HISTORY: Social History   Socioeconomic History   Marital status: Divorced    Spouse name:  Jason   Number of children: 0   Years of education: MS   Highest education level: Not on file  Occupational  History   Occupation: accountant  Tobacco Use   Smoking status: Never   Smokeless tobacco: Never  Vaping Use   Vaping Use: Never used  Substance and Sexual Activity   Alcohol use: Yes    Comment: pt states 1 glass of wine maybe once a month   Drug use: No   Sexual activity: Yes    Birth control/protection: Condom  Other Topics Concern   Not on file  Social History Narrative   Accountant for commercial firm; wants short term disability;  never smoked; 2 drinks a month. Lives in [place] with boy friend [[place] No children. Has one brother    Social Determinants of Radio broadcast assistant Strain: Not on file  Food Insecurity: Not on file  Transportation Needs: Not on file  Physical Activity: Not on file  Stress: Not on file  Social Connections: Not on file  Intimate Partner Violence: Not on file    FAMILY HISTORY: Family History  Problem Relation Age of Onset   Healthy Mother    Hypertension Father    Skin cancer Brother        dx before 58   Colon cancer Maternal Aunt 50   Cervical cancer Maternal Aunt        dx before 103; x2   Cancer Paternal Grandmother 99       unknown primary; ? liver   Lung cancer Paternal Grandfather        dx after 27   Breast cancer Cousin 53       maternal cousin   Colon cancer Other        MGM's sister   Gastric cancer Other        PGM's sister   Leukemia Other        PMG's sister   Rectal cancer Neg Hx    Esophageal cancer Neg Hx     ALLERGIES:  is allergic to diphenhydramine hcl, promethazine, and pseudoeph-doxylamine-dm-apap.  MEDICATIONS:  Current Outpatient Medications  Medication Sig Dispense Refill   almotriptan (AXERT) 12.5 MG tablet Take 1 tab at onset of migraine.  May repeat in 2 hrs, if needed.  Max dose: 2 tabs/day. This is a 30 day prescription. 12 tablet 11   azelastine (ASTELIN) 0.1 % nasal spray Place 1 spray into both nostrils daily as needed for rhinitis.     BOTOX 200 units SOLR Inject  200 Units into the skin See admin instructions. Every 90 days     buPROPion (WELLBUTRIN XL) 300 MG 24 hr tablet Take 300 mg by mouth every morning.     butalbital-acetaminophen-caffeine (FIORICET) 50-325-40 MG tablet Take 1 tablet by mouth every 8 (eight) hours as needed for headache. 10 tablet 3   clonazePAM (KLONOPIN) 1 MG tablet Take 0.5-1 mg by mouth 2 (two) times daily as needed for anxiety.     diazepam (VALIUM) 5 MG tablet One pill 45-60 mins prior to procedure; 1 pill 15 mins prior if needed. 6 tablet 0   EMGALITY 120 MG/ML SOAJ Inject 120 mg into the skin every 30 (thirty) days.     omeprazole (PRILOSEC) 20 MG capsule Take 20 mg by mouth daily.     ondansetron (ZOFRAN) 8 MG tablet Take 1 tablet (8 mg total) by mouth every 8 (eight) hours as needed for nausea or vomiting. [place]  tablet 3   OVER THE COUNTER MEDICATION Take 1 tablet by mouth daily. prevent a migraine     Rimegepant Sulfate (NURTEC) 75 MG TBDP Take 75 mg by mouth as needed (Take 1 at onset of headache, max is 75 mg in 24 hours). 8 tablet 11   rosuvastatin (CRESTOR) 20 MG tablet Take 20 mg by mouth daily.     tiZANidine (ZANAFLEX) 4 MG tablet Take 1 tablet (4 mg total) by mouth every 8 (eight) hours as needed for muscle spasms (migraines). This is a 30 day rx. 20 tablet 5   topiramate (TOPAMAX) 100 MG tablet Take 1 tablet (100 mg total) by mouth 2 (two) times daily. (Patient taking differently: Take 200 mg by mouth at bedtime.) 60 tablet 11   traMADol (ULTRAM) 50 MG tablet Take 50 mg by mouth every 6 (six) hours as needed for pain.     vortioxetine HBr (TRINTELLIX) 20 MG TABS tablet Take 20 mg by mouth daily.  0   zolpidem (AMBIEN) 10 MG tablet Take 10 mg by mouth at bedtime as needed for sleep.     No current facility-administered medications for this visit.      Marland Kitchen  PHYSICAL EXAMINATION: ECOG PERFORMANCE STATUS: 0 - Asymptomatic  Vitals:   10/12/20 1406  BP: 119/89  Pulse: 91  Resp: 16  Temp: 98.4 F (36.9 C)  SpO2:  97%   Filed Weights   10/12/20 1406  Weight: 180 lb 5 oz (81.8 kg)    Physical Exam Vitals and nursing note reviewed.  HENT:     Head: Normocephalic and atraumatic.     Mouth/Throat:     Pharynx: Oropharynx is clear.  Eyes:     Extraocular Movements: Extraocular movements intact.     Pupils: Pupils are equal, round, and reactive to light.  Cardiovascular:     Rate and Rhythm: Normal rate and regular rhythm.  Abdominal:     Palpations: Abdomen is soft.  Musculoskeletal:        General: Normal range of motion.     Cervical back: Normal range of motion.  Skin:    General: Skin is warm.  Neurological:     General: No focal deficit present.     Mental Status: She is alert and oriented to person, place, and time.  Psychiatric:        Behavior: Behavior normal.        Judgment: Judgment normal.     LABORATORY DATA:  I have reviewed the data as listed Lab Results  Component Value Date   WBC 7.9 10/10/2020   HGB 15.1 (H) 10/10/2020   HCT 46.3 (H) 10/10/2020   MCV 92.4 10/10/2020   PLT 419 (H) 10/10/2020   Recent Labs    01/24/20 1620 10/10/20 1435  NA 136 136  K 3.6 3.7  CL 105 105  CO2 19* 23  GLUCOSE 142* 107*  BUN 8 9  CREATININE 0.71 0.86  CALCIUM 8.7* 9.2  GFRNONAA >60 >60    RADIOGRAPHIC STUDIES: I have personally reviewed the radiological images as listed and agreed with the findings in the report. MR BREAST BILATERAL W WO CONTRAST INC CAD  Result Date: 09/21/2020 CLINICAL DATA:  Recently diagnosed invasive ductal carcinoma of the RIGHT breast, retroareolar, 2 o'clock (ribbon clip). LABS:  Not performed at imaging center. EXAM: BILATERAL BREAST MRI WITH AND WITHOUT CONTRAST TECHNIQUE: Multiplanar, multisequence MR images of both breasts were obtained prior to and following the intravenous administration of 7 ml of  Gadavist Three-dimensional MR images were rendered by post-processing of the original MR data on an independent workstation. The  three-dimensional MR images were interpreted, and findings are reported in the following complete MRI report for this study. Three dimensional images were evaluated at the independent interpreting workstation using the DynaCAD thin client. COMPARISON:  No previous breast MRI. FINDINGS: Breast composition: c. Heterogeneous fibroglandular tissue. Background parenchymal enhancement: Moderate. Right breast: Biopsy clip within the inner RIGHT breast, at anterior depth, corresponding to the known invasive carcinoma. Associated mass at the biopsy site measures 1.1 cm. Surrounding non-mass enhancement extends the overall measurement to 3.1 x 2.4 x 3.1 cm. No additional suspicious enhancing mass, non-mass enhancement or secondary signs of malignancy within the RIGHT breast. Left breast: Irregular enhancing mass within retroareolar LEFT breast, measuring 1 cm, with persistent enhancement kinetics (series 9, images 77 and 78). Oval circumscribed ring enhancing mass within the upper-outer quadrant of the LEFT breast, measuring 8 mm, with mixed enhancement kinetics including plateau (series 9, image 57). Lymph nodes: No abnormal appearing lymph nodes. Ancillary findings:  None. IMPRESSION: 1. Irregular enhancing mass within the retroareolar LEFT breast, measuring 1 cm (series 9, image 78), a suspicious finding. Recommend MRI-guided biopsy to exclude contralateral disease. 2. Additional oval circumscribed ring-enhancing mass within the upper-outer quadrant of the LEFT breast, measuring 8 mm (series 9, image 57), a suspicious finding, possibly a benign intramammary lymph node. MRI-guided biopsy is recommended to exclude contralateral disease. 3. Biopsy-proven malignancy within the inner RIGHT breast, at anterior depth, 2 o'clock axis per previous ultrasound (ribbon clip). Associated mass at the biopsy site measures 1.1 cm, however, additional surrounding non-mass enhancement extends the overall measurement to 3.1 cm greatest  dimension. 4. No evidence of additional multifocal or multicentric disease within the RIGHT breast. 5. No evidence of metastatic lymphadenopathy. RECOMMENDATION: 1. MRI-guided biopsy for the irregular enhancing mass within the retroareolar LEFT breast, measuring 1 cm. 2. MRI-guided biopsy of the additional oval circumscribed ring enhancing mass within the upper-outer quadrant of the LEFT breast, measuring 8 mm. BI-RADS CATEGORY  4: Suspicious. Electronically Signed   By: Franki Cabot M.D.   On: 09/21/2020 09:11  MM CLIP PLACEMENT LEFT  Result Date: 10/04/2020 CLINICAL DATA:  Evaluate biopsy markers after MRI guided biopsies. EXAM: 3D DIAGNOSTIC LEFT MAMMOGRAM POST MRI BIOPSY COMPARISON:  Previous exam(s). FINDINGS: 3D Mammographic images were obtained following MRI guided biopsy of 2 left breast masses. The cylinder shaped clip was placed at the site of the upper outer left breast mass. This clip appears to have migrated 1.1 cm laterally. The dumbbell shaped clip was placed at the site of the retroareolar mass. This clip appears to be in adequate position. IMPRESSION: The dumbbell shaped clip marks the site of the biopsied retroareolar mass and appears to be in good position. The cylinder shaped clip was placed at the site of the upper-outer left breast mass which is biopsy. This clip appears to have migrated approximately 1.1 cm laterally. Final Assessment: Post Procedure Mammograms for Marker Placement Electronically Signed   By: Dorise Bullion III M.D.   On: 10/04/2020 10:40  MR LT BREAST BX W LOC DEV 1ST LESION IMAGE BX SPEC MR GUIDE  Addendum Date: 10/08/2020   ADDENDUM REPORT: 10/08/2020 09:49 ADDENDUM: Pathology revealed FIBROCYSTIC CHANGES- NO ATYPIA OR MALIGNANCY of the LEFT breast, upper outer, cylinder clip. This was found to be concordant by Dr. Dorise Bullion. Pathology revealed FIBROCYSTIC CHANGES- NO ATYPIA OR MALIGNANCY of the LEFT breast, retroareolar, dumbbell clip.  This was found to be  concordant by Dr. Dorise Bullion. Pathology results were discussed with the patient by telephone. The patient reported doing well after the biopsies with tenderness at the sites. Post biopsy instructions and care were reviewed and questions were answered. The patient was encouraged to call [facility] for any additional concerns. Bilateral breast MRI to follow LEFT recommended in 6 months per protocol. The patient has a recent diagnosis of RIGHT breast cancer and should follow her outlined treatment plan. Pathology results reported by Stacie Acres RN on 10/08/2020. Electronically Signed   By: Dorise Bullion III M.D.   On: 10/08/2020 09:49   Result Date: 10/08/2020 CLINICAL DATA:  Biopsy of 2 masses in the left breast EXAM: MRI GUIDED CORE NEEDLE BIOPSY OF THE LEFT BREAST TECHNIQUE: Multiplanar, multisequence MR imaging of the left breast was performed both before and after administration of intravenous contrast. CONTRAST:  39m GADAVIST GADOBUTROL 1 MMOL/ML IV SOLN COMPARISON:  Previous exams. FINDINGS: I met with the patient, and we discussed the procedure of MRI guided biopsy, including risks, benefits, and alternatives. Specifically, we discussed the risks of infection, bleeding, tissue injury, clip migration, and inadequate sampling. Informed, written consent was given. The usual time out protocol was performed immediately prior to the procedure. Using sterile technique, 1% Lidocaine, MRI guidance, and a 9 gauge vacuum assisted device, biopsy was performed of the mass in the upper-outer left breast using a lateral approach. At the conclusion of the procedure, a cylinder shaped tissue marker clip was deployed into the biopsy cavity. Follow-up 2-view mammogram was performed and dictated separately. Using sterile technique, 1% Lidocaine, MRI guidance, and a 9 gauge vacuum assisted device, biopsy was performed of the mass in the retroareolar region of the left breast using a lateral  approach. At the conclusion of the procedure, a dumbbell shaped tissue marker clip was deployed into the biopsy cavity. Follow-up 2-view mammogram was performed and dictated separately. IMPRESSION: MRI guided biopsy of 2 left breast masses as above. No apparent complications. Electronically Signed: By: DDorise BullionIII M.D. On: 10/04/2020 10:26  MR LT BREAST BX W LOC DEV EA ADD LESION IMAGE BX SPEC MR GUIDE  Addendum Date: 10/08/2020   ADDENDUM REPORT: 10/08/2020 09:49 ADDENDUM: Pathology revealed FIBROCYSTIC CHANGES- NO ATYPIA OR MALIGNANCY of the LEFT breast, upper outer, cylinder clip. This was found to be concordant by Dr. DDorise Bullion Pathology revealed FIBROCYSTIC CHANGES- NO ATYPIA OR MALIGNANCY of the LEFT breast, retroareolar, dumbbell clip. This was found to be concordant by Dr. DDorise Bullion Pathology results were discussed with the patient by telephone. The patient reported doing well after the biopsies with tenderness at the sites. Post biopsy instructions and care were reviewed and questions were answered. The [facility]. Bilateral breast MRI to follow LEFT recommended in 6 months per protocol. The patient has a recent diagnosis of RIGHT breast cancer and should follow her outlined treatment plan. Pathology results reported by SStacie AcresRN on 10/08/2020. Electronically Signed   By: DDorise BullionIII M.D.   On: 10/08/2020 09:49   Result Date: 10/08/2020 CLINICAL DATA:  Biopsy of 2 masses in the left breast EXAM: MRI GUIDED CORE NEEDLE BIOPSY OF THE LEFT BREAST TECHNIQUE: Multiplanar, multisequence MR imaging of the left breast was performed both before and after administration of intravenous contrast. CONTRAST:  859mGADAVIST GADOBUTROL 1 MMOL/ML IV SOLN COMPARISON:  Previous exams.  FINDINGS: I met with the patient, and we discussed the procedure of MRI guided biopsy, including risks, benefits, and  alternatives. Specifically, we discussed the risks of infection, bleeding, tissue injury, clip migration, and inadequate sampling. Informed, written consent was given. The usual time out protocol was performed immediately prior to the procedure. Using sterile technique, 1% Lidocaine, MRI guidance, and a 9 gauge vacuum assisted device, biopsy was performed of the mass in the upper-outer left breast using a lateral approach. At the conclusion of the procedure, a cylinder shaped tissue marker clip was deployed into the biopsy cavity. Follow-up 2-view mammogram was performed and dictated separately. Using sterile technique, 1% Lidocaine, MRI guidance, and a 9 gauge vacuum assisted device, biopsy was performed of the mass in the retroareolar region of the left breast using a lateral approach. At the conclusion of the procedure, a dumbbell shaped tissue marker clip was deployed into the biopsy cavity. Follow-up 2-view mammogram was performed and dictated separately. IMPRESSION: MRI guided biopsy of 2 left breast masses as above. No apparent complications. Electronically Signed: By: Dorise Bullion III M.D. 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  #Left breast- MRI #upper outer quadrant- 86m #retroareolar 1cm-. NEGATIVE for malignancy.  #Discussed that given most likely stage I right-sided breast cancer rather than stage II-recommend upfront surgery/lumpectomy with sentinel lymph node.  Discussed that if stage II-then would have considered neoadjuvant chemotherapy.  Await discussion at the tumor conference.  Discussed with Dr. BBarry Dienes  #Given adjuvantly patient will need Taxol [name]-weekly x12 followed by [name] every 3 weeks for 12 weeks.  We will order a MUGA  scan.  # Headaches/Migraines-on Fioricet/amitriptyline; and also Emgalti SQ. Stable.  #Anxiety: S/p Valium prior to MRI biopsy-stable.  #Genetic testing-s/p genetic counseling-expanded panel negative.  #Disability-given patient's extreme anxiety/headaches-an upcoming fairly intensive treatment-including surgery with sentinel lymph node; followed by chemotherapy for total of 1 year; and radiation/endocrine therapy, I think it is reasonable to recommend disability for 6 months.  This can be reevaluated after 6 months.  Paper filled out. # DISPOSITION:  # follow up in week of October, 17th, 2022; NO chemo/or labs; MUGA scan prior-Dr.B   All questions were answered. The patient/family knows to call the clinic with any problems, questions or concerns.     GCammie Sickle MD 10/12/2020 4:48 PM

## 2020-10-12 NOTE — Telephone Encounter (Signed)
Revealed negative genetic testing of pan-cancer panel.  Discussed that we do not know why she has breast cancer or why there is cancer in the family. It could be familial, due to a different gene that we are not testing, or maybe our current technology may not be able to pick something up.  It will be important for her to keep in contact with genetics to keep up with whether additional testing may be needed.   

## 2020-10-12 NOTE — Progress Notes (Signed)
HPI:  Ms. Berghuis was previously seen in the [place] clinic due to a personal and family history of cancer and concerns regarding a hereditary predisposition to cancer. Please refer to our prior cancer genetics clinic note for more information regarding our discussion, assessment and recommendations, at the time. Ms. Martine recent genetic test results were disclosed to her, as were recommendations warranted by these results. These results and recommendations are discussed in more detail below.  CANCER HISTORY:  Oncology History Overview Note   # RIGHT BREAST 2'O clock- [facility]- ER-95%; PR-95%; Her 2-POSITIVE [facility] 3+]   MRI BREAST: 1. Irregular enhancing mass within the retroareolar LEFT breast, measuring 1 cm (series 9, image 78), a suspicious finding. Recommend MRI-guided biopsy to exclude contralateral disease. 2. Additional oval circumscribed ring-enhancing mass within the upper-outer quadrant of the LEFT breast, measuring 8 mm (series 9, image 57), a suspicious finding, possibly a benign intramammary lymph node. MRI-guided biopsy is recommended to exclude contralateral disease. -------------------------------------- 3. Biopsy-proven malignancy within the inner RIGHT breast, at anterior depth, 2 o'clock axis per previous ultrasound (ribbon clip). Associated mass at the biopsy site measures 1.1 cm, however, additional surrounding non-mass enhancement extends the overall measurement to 3.1 cm greatest dimension. 4. No evidence of additional multifocal or multicentric disease within the RIGHT breast. 5. No evidence of metastatic lymphadenopathy.  # Headaches-Migraine/ Anxiety [[name]; Dr.Yan  botox]   Carcinoma of upper-inner quadrant of right breast in female, estrogen receptor positive ([place])  09/21/2020 Initial Diagnosis   Malignant neoplasm of upper-inner quadrant of right female breast ([place])   10/01/2020 Genetic Testing   Negative hereditary cancer genetic  testing: no pathogenic variants detected in [name] and [name].  The report dates are October 01, 2020 and October 11, 2020, respectively.    The BRCAplus panel offered by [employer] and includes sequencing and deletion/duplication analysis for the following 8 genes: ATM, BRCA1, BRCA2, CDH1, CHEK2, PALB2, PTEN, and TP53.  The CustomNext-Cancer+RNAinsight panel offered by [facility] includes sequencing and rearrangement analysis for the following 47 genes:  APC, ATM, AXIN2, BARD1, BMPR1A, BRCA1, BRCA2, BRIP1, CDH1, CDK4, CDKN2A, CHEK2, DICER1, EPCAM, GREM1, HOXB13, MEN1, MLH1, MSH2, MSH3, MSH6, MUTYH, NBN, NF1, NF2, NTHL1, PALB2, PMS2, POLD1, POLE, PTEN, RAD51C, RAD51D, RECQL, RET, SDHA, SDHAF2, SDHB, SDHC, SDHD, SMAD4, SMARCA4, STK11, TP53, TSC1, TSC2, and VHL.  RNA data is routinely analyzed for use in variant interpretation for all genes.     FAMILY HISTORY:  We obtained a detailed, 4-generation family history.  Significant diagnoses are listed below: Family History  Problem Relation Age of Onset   Skin cancer Brother        dx before 66   Colon cancer Maternal Aunt 37   Cervical cancer Maternal Aunt        dx before 63; x2   Cancer Paternal Grandmother 99       unknown primary; ? liver   Lung cancer Paternal Grandfather        dx after 43   Breast cancer Cousin 4       maternal cousin   Colon cancer Other        MGM's sister   Gastric cancer Other        PGM's sister   Leukemia Other        PMG's sister   Ms. Fiebelkorn is unaware of previous family history of genetic testing for hereditary cancer risks. There is no reported Ashkenazi Jewish ancestry. There is  no known consanguinity.  GENETIC TEST RESULTS: Genetic testing reported out on October 11, 2020. The CustomNext-Cancer +RNAinsight Panel found no pathogenic mutations. The CustomNext-Cancer+RNAinsight panel offered by [facility] includes sequencing and rearrangement  analysis for the following 47 genes:  APC, ATM, AXIN2, BARD1, BMPR1A, BRCA1, BRCA2, BRIP1, CDH1, CDK4, CDKN2A, CHEK2, DICER1, EPCAM, GREM1, HOXB13, MEN1, MLH1, MSH2, MSH3, MSH6, MUTYH, NBN, NF1, NF2, NTHL1, PALB2, PMS2, POLD1, POLE, PTEN, RAD51C, RAD51D, RECQL, RET, SDHA, SDHAF2, SDHB, SDHC, SDHD, SMAD4, SMARCA4, STK11, TP53, TSC1, TSC2, and VHL.  RNA data is routinely analyzed for use in variant interpretation for all genes.  The test report has been scanned into EPIC and is located under the Molecular Pathology section of the Results Review tab.  A portion of the result report is included below for reference.     We discussed with Ms. Gravelle that because current genetic testing is not perfect, it is possible there may be a gene mutation in one of these genes that current testing cannot detect, but that chance is small.  We also discussed, that there could be another gene that has not yet been discovered, or that we have not yet tested, that is responsible for the cancer diagnoses in the family. It is also possible there is a hereditary cause for the cancer in the family that Ms. Duffin did not inherit and therefore was not identified in her testing.  Therefore, it is important to remain in touch with cancer genetics in the future so that we can continue to offer Ms. Muller the most up to date genetic testing.   ADDITIONAL GENETIC TESTING: There are other genes that are associated with increased cancer risk that can be analyzed. Should Ms. Kolek wish to pursue additional genetic testing, we are happy to discuss and coordinate this testing, at any time.     CANCER SCREENING RECOMMENDATIONS: Ms. Messman test result is considered negative (normal).  This means that we have not identified a hereditary cause for her personal history of cancer at this time. Most cancersare sporadic/familial, and this negative test suggests that her cancer may fall into this category.    While reassuring, this does not  definitively rule out a hereditary predisposition to cancer. It is still possible that there could be genetic mutations that are undetectable by current technology. There could be genetic mutations in genes that have not been tested or identified to increase cancer risk.  Therefore, it is recommended she continue to follow the cancer management and screening guidelines provided by her oncology and primary healthcare provider.   An individual's cancer risk and medical management are not determined by genetic test results alone. Overall cancer risk assessment incorporates additional factors, including personal medical history, family history, and any available genetic information that may result in a personalized plan for cancer prevention and surveillance  RECOMMENDATIONS FOR FAMILY MEMBERS:  Individuals in this family might be at some increased risk of developing cancer, over the general population risk, simply due to the family history of cancer.  We recommended women in this family have a yearly mammogram beginning at age 31, or 62 years younger than the earliest onset of cancer, an annual clinical breast exam, and perform monthly breast self-exams. Women in this family should also have a gynecological exam as recommended by their primary provider. Family members should be referred for colonoscopy starting at age 37, or earlier, as recommended by their providers.   It is also possible there is a hereditary cause for the cancer  in Ms. Saltos family that she did not inherit and therefore was not identified in her.  Based on Ms. Murtha's family history, we recommended her maternal cousin with a breast cancer history have genetic counseling and testing. Ms. Bardon will let us know if we can be of any assistance in coordinating genetic counseling and/or testing for this family member.   FOLLOW-UP: Lastly, we discussed with Ms. Darrough that cancer genetics is a rapidly advancing field and it is possible that new  genetic tests will be appropriate for her and/or her family members in the future. We encouraged her to remain in contact with cancer genetics on an annual basis so we can update her personal and family histories and let her know of advances in cancer genetics that may benefit this family.   Our contact number was provided. Ms. Musolf questions were answered to her satisfaction, and she knows she is welcome to call us at anytime with additional questions or concerns.    Xadrian Craighead M. Joette Catching, [place], [facility] Genetic Counselor [email or web address] (P) [phone]

## 2020-10-12 NOTE — Assessment & Plan Note (Addendum)
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  #Left breast- MRI #upper outer quadrant- 42mm #retroareolar 1cm-. NEGATIVE for malignancy.  #Discussed that given most likely stage I right-sided breast cancer rather than stage II-recommend upfront surgery/lumpectomy with sentinel lymph node.  Discussed that if stage II-then would have considered neoadjuvant chemotherapy.  Await discussion at the tumor conference.  Discussed with Dr. Barry Dienes.  #Given adjuvantly patient will need Taxol [name]-weekly x12 followed by [name] every 3 weeks for 12 weeks.  We will order a MUGA scan.  # Headaches/Migraines-on Fioricet/amitriptyline; and also Emgalti SQ. Stable.  #Anxiety: S/p Valium prior to MRI biopsy-stable.  #Genetic testing-s/p genetic counseling-expanded panel negative.  #Disability-given patient's extreme anxiety/headaches-an upcoming fairly intensive treatment-including surgery with sentinel lymph node; followed by chemotherapy for total of 1 year; and radiation/endocrine therapy, I think it is reasonable to recommend disability for 6 months.  This can be reevaluated after 6 months.  Paper filled out. # DISPOSITION:  # follow up in week of October, 17th, 2022; NO chemo/or labs; MUGA scan prior-Dr.B

## 2020-10-12 NOTE — Telephone Encounter (Signed)
Contacted patient in attempt to disclose results of pan-cancer genetic testing.  LVM with contact information requesting a call back.  

## 2020-10-16 ENCOUNTER — Ambulatory Visit
Admission: RE | Admit: 2020-10-16 | Discharge: 2020-10-16 | Disposition: A | Discharge status: Home / Self Care | Payer: No Typology Code available for payment source | Source: Ambulatory Visit | Attending: General Surgery | Admitting: General Surgery

## 2020-10-16 ENCOUNTER — Encounter: Payer: Self-pay | Admitting: Internal Medicine

## 2020-10-16 ENCOUNTER — Other Ambulatory Visit: Payer: Self-pay

## 2020-10-16 DIAGNOSIS — Z170 Estrogen receptor positive status [ER+]: Secondary | ICD-10-CM

## 2020-10-16 DIAGNOSIS — C50211 Malignant neoplasm of upper-inner quadrant of right female breast: Secondary | ICD-10-CM

## 2020-10-16 NOTE — Progress Notes (Signed)
On September 26 I left a voicemail for the patient regarding the discussion of the tumor conference. The area of non-mass enhancement in the right breast-Postbiopsy changes versus Non-invasive malignancy.   I sent a message to Dr. Barry Dienes with the above plan.The plan is to proceed with surgery followed by adjuvant chemotherapy. GB

## 2020-10-17 ENCOUNTER — Ambulatory Visit (HOSPITAL_COMMUNITY)
Admission: RE | Admit: 2020-10-17 | Discharge: 2020-10-17 | Disposition: A | Discharge status: Home / Self Care | Payer: PRIVATE HEALTH INSURANCE | Attending: General Surgery | Admitting: General Surgery

## 2020-10-17 ENCOUNTER — Other Ambulatory Visit: Payer: Self-pay

## 2020-10-17 ENCOUNTER — Ambulatory Visit
Admission: RE | Admit: 2020-10-17 | Discharge: 2020-10-17 | Disposition: A | Discharge status: Home / Self Care | Payer: No Typology Code available for payment source | Source: Ambulatory Visit | Attending: General Surgery | Admitting: General Surgery

## 2020-10-17 ENCOUNTER — Ambulatory Visit (HOSPITAL_COMMUNITY): Payer: PRIVATE HEALTH INSURANCE | Admitting: Anesthesiology

## 2020-10-17 ENCOUNTER — Encounter (HOSPITAL_COMMUNITY)
Admission: RE | Admit: 2020-10-17 | Discharge: 2020-10-17 | Disposition: A | Discharge status: Home / Self Care | Payer: PRIVATE HEALTH INSURANCE | Source: Ambulatory Visit | Attending: General Surgery | Admitting: General Surgery

## 2020-10-17 ENCOUNTER — Encounter (HOSPITAL_COMMUNITY): Payer: Self-pay | Admitting: General Surgery

## 2020-10-17 ENCOUNTER — Ambulatory Visit (HOSPITAL_COMMUNITY): Payer: PRIVATE HEALTH INSURANCE

## 2020-10-17 ENCOUNTER — Encounter (HOSPITAL_COMMUNITY)
Admission: RE | Disposition: A | Discharge status: Home / Self Care | Payer: Self-pay | Source: Home / Self Care | Attending: General Surgery

## 2020-10-17 DIAGNOSIS — Z888 Allergy status to other drugs, medicaments and biological substances status: Secondary | ICD-10-CM | POA: Diagnosis not present

## 2020-10-17 DIAGNOSIS — N63 Unspecified lump in unspecified breast: Secondary | ICD-10-CM

## 2020-10-17 DIAGNOSIS — Z170 Estrogen receptor positive status [ER+]: Secondary | ICD-10-CM

## 2020-10-17 DIAGNOSIS — Z803 Family history of malignant neoplasm of breast: Secondary | ICD-10-CM | POA: Diagnosis not present

## 2020-10-17 DIAGNOSIS — Z419 Encounter for procedure for purposes other than remedying health state, unspecified: Secondary | ICD-10-CM

## 2020-10-17 DIAGNOSIS — M797 Fibromyalgia: Secondary | ICD-10-CM | POA: Diagnosis not present

## 2020-10-17 DIAGNOSIS — Z452 Encounter for adjustment and management of vascular access device: Secondary | ICD-10-CM

## 2020-10-17 DIAGNOSIS — N6011 Diffuse cystic mastopathy of right breast: Secondary | ICD-10-CM | POA: Insufficient documentation

## 2020-10-17 DIAGNOSIS — K219 Gastro-esophageal reflux disease without esophagitis: Secondary | ICD-10-CM | POA: Diagnosis not present

## 2020-10-17 DIAGNOSIS — E785 Hyperlipidemia, unspecified: Secondary | ICD-10-CM | POA: Diagnosis not present

## 2020-10-17 DIAGNOSIS — C50211 Malignant neoplasm of upper-inner quadrant of right female breast: Secondary | ICD-10-CM | POA: Diagnosis present

## 2020-10-17 DIAGNOSIS — Z79899 Other long term (current) drug therapy: Secondary | ICD-10-CM | POA: Diagnosis not present

## 2020-10-17 HISTORY — PX: BREAST LUMPECTOMY WITH RADIOACTIVE SEED LOCALIZATION: SHX6424

## 2020-10-17 HISTORY — PX: PORTACATH PLACEMENT: SHX2246

## 2020-10-17 LAB — POCT PREGNANCY, URINE: Preg Test, Ur: NEGATIVE

## 2020-10-17 SURGERY — BREAST LUMPECTOMY WITH RADIOACTIVE SEED LOCALIZATION
Anesthesia: General | Site: Chest | Laterality: Right

## 2020-10-17 MED ORDER — MIDAZOLAM HCL 2 MG/2ML IJ SOLN
1.0000 mg | Freq: Once | INTRAMUSCULAR | Status: AC
  Filled 2020-10-17: qty 1

## 2020-10-17 MED ORDER — KETOROLAC TROMETHAMINE 15 MG/ML IJ SOLN: 15.0000 mg | INTRAMUSCULAR | Status: AC

## 2020-10-17 MED ORDER — HEPARIN 6000 UNIT IRRIGATION SOLUTION
Status: DC | PRN
  Administered 2020-10-17: 1

## 2020-10-17 MED ORDER — DEXAMETHASONE SODIUM PHOSPHATE 4 MG/ML IJ SOLN
INTRAMUSCULAR | Status: DC | PRN
  Administered 2020-10-17: 4 mg

## 2020-10-17 MED ORDER — EPHEDRINE SULFATE-NACL 50-0.9 MG/10ML-% IV SOSY
PREFILLED_SYRINGE | INTRAVENOUS | Status: DC | PRN
  Administered 2020-10-17: 5 mg via INTRAVENOUS
  Administered 2020-10-17 (×2): 10 mg via INTRAVENOUS

## 2020-10-17 MED ORDER — CEFAZOLIN SODIUM-DEXTROSE 2-4 GM/100ML-% IV SOLN
2.0000 g | INTRAVENOUS | Status: AC
  Administered 2020-10-17: 2 g via INTRAVENOUS
  Filled 2020-10-17: qty 100

## 2020-10-17 MED ORDER — PHENYLEPHRINE 40 MCG/ML (10ML) SYRINGE FOR IV PUSH (FOR BLOOD PRESSURE SUPPORT)
PREFILLED_SYRINGE | INTRAVENOUS | Status: DC | PRN
  Administered 2020-10-17: 80 ug via INTRAVENOUS
  Administered 2020-10-17: 120 ug via INTRAVENOUS
  Administered 2020-10-17: 80 ug via INTRAVENOUS
  Administered 2020-10-17: 120 ug via INTRAVENOUS

## 2020-10-17 MED ORDER — LIDOCAINE HCL (PF) 2 % IJ SOLN
INTRAMUSCULAR | Status: AC
  Filled 2020-10-17: qty 5

## 2020-10-17 MED ORDER — KETOROLAC TROMETHAMINE 15 MG/ML IJ SOLN
INTRAMUSCULAR | Status: AC
  Administered 2020-10-17: 15 mg via INTRAVENOUS
  Filled 2020-10-17: qty 1

## 2020-10-17 MED ORDER — TECHNETIUM TC 99M TILMANOCEPT KIT
1.0000 | PACK | Freq: Once | INTRAVENOUS | Status: AC | PRN
  Administered 2020-10-17: 1 via INTRADERMAL

## 2020-10-17 MED ORDER — MIDAZOLAM HCL 2 MG/2ML IJ SOLN
INTRAMUSCULAR | Status: AC
  Administered 2020-10-17: 2 mg via INTRAVENOUS
  Filled 2020-10-17: qty 2

## 2020-10-17 MED ORDER — ONDANSETRON HCL 4 MG/2ML IJ SOLN
INTRAMUSCULAR | Status: DC | PRN
  Administered 2020-10-17: 4 mg via INTRAVENOUS

## 2020-10-17 MED ORDER — PROPOFOL 10 MG/ML IV BOLUS
INTRAVENOUS | Status: AC
  Filled 2020-10-17: qty 20

## 2020-10-17 MED ORDER — MEPERIDINE HCL 25 MG/ML IJ SOLN: 6.2500 mg | INTRAMUSCULAR | Status: DC | PRN

## 2020-10-17 MED ORDER — AMISULPRIDE (ANTIEMETIC) 5 MG/2ML IV SOLN: 10.0000 mg | Freq: Once | INTRAVENOUS | Status: DC | PRN

## 2020-10-17 MED ORDER — FENTANYL CITRATE (PF) 100 MCG/2ML IJ SOLN
100.0000 ug | Freq: Once | INTRAMUSCULAR | Status: AC
  Filled 2020-10-17: qty 2

## 2020-10-17 MED ORDER — GABAPENTIN 300 MG PO CAPS
300.0000 mg | ORAL_CAPSULE | ORAL | Status: AC
  Administered 2020-10-17: 300 mg via ORAL
  Filled 2020-10-17: qty 1

## 2020-10-17 MED ORDER — PROPOFOL 10 MG/ML IV BOLUS
INTRAVENOUS | Status: DC | PRN
  Administered 2020-10-17: 200 mg via INTRAVENOUS
  Administered 2020-10-17: 40 mg via INTRAVENOUS

## 2020-10-17 MED ORDER — MIDAZOLAM HCL 2 MG/2ML IJ SOLN
2.0000 mg | Freq: Once | INTRAMUSCULAR | Status: AC
  Filled 2020-10-17: qty 2

## 2020-10-17 MED ORDER — LIDOCAINE HCL 1 % IJ SOLN
INTRAMUSCULAR | Status: DC | PRN
  Administered 2020-10-17: 40 mL via INTRAMUSCULAR

## 2020-10-17 MED ORDER — FENTANYL CITRATE (PF) 100 MCG/2ML IJ SOLN
INTRAMUSCULAR | Status: AC
  Filled 2020-10-17: qty 2

## 2020-10-17 MED ORDER — CLONIDINE HCL (ANALGESIA) 100 MCG/ML EP SOLN
EPIDURAL | Status: DC | PRN
  Administered 2020-10-17: 80 ug

## 2020-10-17 MED ORDER — SCOPOLAMINE 1 MG/3DAYS TD PT72
1.0000 | MEDICATED_PATCH | TRANSDERMAL | Status: DC
  Administered 2020-10-17: 1.5 mg via TRANSDERMAL
  Filled 2020-10-17: qty 1

## 2020-10-17 MED ORDER — CHLORHEXIDINE GLUCONATE 0.12 % MT SOLN
15.0000 mL | Freq: Once | OROMUCOSAL | Status: AC
  Administered 2020-10-17: 15 mL via OROMUCOSAL
  Filled 2020-10-17: qty 15

## 2020-10-17 MED ORDER — FENTANYL CITRATE (PF) 250 MCG/5ML IJ SOLN
INTRAMUSCULAR | Status: DC | PRN
  Administered 2020-10-17: 25 ug via INTRAVENOUS
  Administered 2020-10-17: 50 ug via INTRAVENOUS

## 2020-10-17 MED ORDER — FENTANYL CITRATE (PF) 250 MCG/5ML IJ SOLN
INTRAMUSCULAR | Status: AC
  Filled 2020-10-17: qty 5

## 2020-10-17 MED ORDER — ENSURE PRE-SURGERY PO LIQD: 296.0000 mL | Freq: Once | ORAL | Status: DC

## 2020-10-17 MED ORDER — LIDOCAINE 2% (20 MG/ML) 5 ML SYRINGE
INTRAMUSCULAR | Status: DC | PRN
  Administered 2020-10-17: 80 mg via INTRAVENOUS

## 2020-10-17 MED ORDER — HYDROMORPHONE HCL 1 MG/ML IJ SOLN: 0.2500 mg | INTRAMUSCULAR | Status: DC | PRN

## 2020-10-17 MED ORDER — CHLORHEXIDINE GLUCONATE CLOTH 2 % EX PADS: 6.0000 | MEDICATED_PAD | Freq: Once | CUTANEOUS | Status: DC

## 2020-10-17 MED ORDER — ROPIVACAINE HCL 5 MG/ML IJ SOLN
INTRAMUSCULAR | Status: DC | PRN
  Administered 2020-10-17: 30 mL

## 2020-10-17 MED ORDER — ONDANSETRON HCL 4 MG/2ML IJ SOLN
INTRAMUSCULAR | Status: AC
  Filled 2020-10-17: qty 2

## 2020-10-17 MED ORDER — LIDOCAINE HCL 1 % IJ SOLN
INTRAMUSCULAR | Status: AC
  Filled 2020-10-17: qty 20

## 2020-10-17 MED ORDER — EPHEDRINE 5 MG/ML INJ
INTRAVENOUS | Status: AC
  Filled 2020-10-17: qty 5

## 2020-10-17 MED ORDER — MIDAZOLAM HCL 2 MG/2ML IJ SOLN: 2.0000 mg | Freq: Once | INTRAMUSCULAR | Status: DC

## 2020-10-17 MED ORDER — DEXAMETHASONE SODIUM PHOSPHATE 10 MG/ML IJ SOLN
INTRAMUSCULAR | Status: DC | PRN
  Administered 2020-10-17: 8 mg via INTRAVENOUS

## 2020-10-17 MED ORDER — MIDAZOLAM HCL 2 MG/2ML IJ SOLN
INTRAMUSCULAR | Status: AC
  Administered 2020-10-17: 1 mg via INTRAVENOUS
  Filled 2020-10-17: qty 2

## 2020-10-17 MED ORDER — ACETAMINOPHEN 500 MG PO TABS
1000.0000 mg | ORAL_TABLET | Freq: Once | ORAL | Status: AC
  Administered 2020-10-17: 1000 mg via ORAL
  Filled 2020-10-17: qty 2

## 2020-10-17 MED ORDER — BUPIVACAINE-EPINEPHRINE (PF) 0.25% -1:200000 IJ SOLN
INTRAMUSCULAR | Status: AC
  Filled 2020-10-17: qty 30

## 2020-10-17 MED ORDER — 0.9 % SODIUM CHLORIDE (POUR BTL) OPTIME
TOPICAL | Status: DC | PRN
  Administered 2020-10-17: 1000 mL

## 2020-10-17 MED ORDER — DEXAMETHASONE SODIUM PHOSPHATE 10 MG/ML IJ SOLN
INTRAMUSCULAR | Status: AC
  Filled 2020-10-17: qty 1

## 2020-10-17 MED ORDER — MIDAZOLAM HCL 2 MG/2ML IJ SOLN
INTRAMUSCULAR | Status: AC
  Filled 2020-10-17: qty 2

## 2020-10-17 MED ORDER — ORAL CARE MOUTH RINSE: 15.0000 mL | Freq: Once | OROMUCOSAL | Status: AC

## 2020-10-17 MED ORDER — HEPARIN SOD (PORK) LOCK FLUSH 100 UNIT/ML IV SOLN
INTRAVENOUS | Status: AC
  Filled 2020-10-17: qty 5

## 2020-10-17 MED ORDER — CELECOXIB 200 MG PO CAPS: 200.0000 mg | ORAL_CAPSULE | Freq: Once | ORAL | Status: DC

## 2020-10-17 MED ORDER — OXYCODONE HCL 5 MG PO TABS: 5.0000 mg | ORAL_TABLET | Freq: Four times a day (QID) | ORAL | 0 refills | Status: DC | PRN

## 2020-10-17 MED ORDER — PHENYLEPHRINE 40 MCG/ML (10ML) SYRINGE FOR IV PUSH (FOR BLOOD PRESSURE SUPPORT)
PREFILLED_SYRINGE | INTRAVENOUS | Status: AC
  Filled 2020-10-17: qty 10

## 2020-10-17 MED ORDER — HEPARIN SOD (PORK) LOCK FLUSH 100 UNIT/ML IV SOLN
INTRAVENOUS | Status: DC | PRN
  Administered 2020-10-17: 500 [IU] via INTRAVENOUS

## 2020-10-17 MED ORDER — LACTATED RINGERS IV SOLN: INTRAVENOUS | Status: DC

## 2020-10-17 MED ORDER — HEPARIN 6000 UNIT IRRIGATION SOLUTION
Status: AC
  Filled 2020-10-17: qty 500

## 2020-10-17 MED ORDER — FENTANYL CITRATE (PF) 100 MCG/2ML IJ SOLN
INTRAMUSCULAR | Status: AC
  Administered 2020-10-17: 100 ug via INTRAVENOUS
  Filled 2020-10-17: qty 2

## 2020-10-17 MED ORDER — MIDAZOLAM HCL 2 MG/2ML IJ SOLN
INTRAMUSCULAR | Status: DC | PRN
  Administered 2020-10-17: 2 mg via INTRAVENOUS

## 2020-10-17 SURGICAL SUPPLY — 64 items
BAG COUNTER SPONGE SURGICOUNT (BAG) ×3 IMPLANT
BAG DECANTER FOR FLEXI CONT (MISCELLANEOUS) ×3 IMPLANT
BINDER BREAST LRG (GAUZE/BANDAGES/DRESSINGS) IMPLANT
BINDER BREAST XLRG (GAUZE/BANDAGES/DRESSINGS) ×3 IMPLANT
BLADE SURG 10 STRL SS (BLADE) ×3 IMPLANT
CANISTER SUCT 3000ML PPV (MISCELLANEOUS) IMPLANT
CHLORAPREP W/TINT 26 (MISCELLANEOUS) ×3 IMPLANT
CLIP TI LARGE 6 (CLIP) ×3 IMPLANT
CLIP TI MEDIUM 6 (CLIP) ×9 IMPLANT
CLIP VESOCCLUDE LG 6/CT (CLIP) ×3 IMPLANT
CLSR STERI-STRIP ANTIMIC 1/2X4 (GAUZE/BANDAGES/DRESSINGS) ×3 IMPLANT
COVER PROBE W GEL 5X96 (DRAPES) ×3 IMPLANT
COVER SURGICAL LIGHT HANDLE (MISCELLANEOUS) ×3 IMPLANT
COVER TRANSDUCER ULTRASND GEL (DISPOSABLE) IMPLANT
DECANTER SPIKE VIAL GLASS SM (MISCELLANEOUS) ×6 IMPLANT
DERMABOND ADVANCED (GAUZE/BANDAGES/DRESSINGS) ×2
DERMABOND ADVANCED .7 DNX12 (GAUZE/BANDAGES/DRESSINGS) ×4 IMPLANT
DEVICE DUBIN SPECIMEN MAMMOGRA (MISCELLANEOUS) ×3 IMPLANT
DRAPE C-ARM 42X120 X-RAY (DRAPES) ×3 IMPLANT
DRAPE CHEST BREAST 15X10 FENES (DRAPES) IMPLANT
DRAPE WARM FLUID 44X44 (DRAPES) IMPLANT
DRSG PAD ABDOMINAL 8X10 ST (GAUZE/BANDAGES/DRESSINGS) ×3 IMPLANT
ELECT COATED BLADE 2.86 ST (ELECTRODE) ×3 IMPLANT
ELECT REM PT RETURN 9FT ADLT (ELECTROSURGICAL) ×3
ELECTRODE REM PT RTRN 9FT ADLT (ELECTROSURGICAL) ×2 IMPLANT
GAUZE 4X4 16PLY ~~LOC~~+RFID DBL (SPONGE) ×3 IMPLANT
GAUZE SPONGE 4X4 12PLY STRL LF (GAUZE/BANDAGES/DRESSINGS) ×3 IMPLANT
GEL ULTRASOUND 20GR AQUASONIC (MISCELLANEOUS) IMPLANT
GLOVE SURG ENC MOIS LTX SZ6 (GLOVE) ×3 IMPLANT
GLOVE SURG MICRO LTX SZ6.5 (GLOVE) ×3 IMPLANT
GLOVE SURG UNDER LTX SZ6.5 (GLOVE) ×3 IMPLANT
GOWN STRL REUS W/ TWL LRG LVL3 (GOWN DISPOSABLE) ×2 IMPLANT
GOWN STRL REUS W/TWL 2XL LVL3 (GOWN DISPOSABLE) ×3 IMPLANT
GOWN STRL REUS W/TWL LRG LVL3 (GOWN DISPOSABLE) ×1
KIT BASIN OR (CUSTOM PROCEDURE TRAY) ×3 IMPLANT
KIT MARKER MARGIN INK (KITS) ×3 IMPLANT
KIT PORT POWER 8FR ISP CVUE (Port) ×3 IMPLANT
KIT TURNOVER KIT B (KITS) ×3 IMPLANT
LIGHT WAVEGUIDE WIDE FLAT (MISCELLANEOUS) ×3 IMPLANT
NEEDLE 22X1 1/2 (OR ONLY) (NEEDLE) ×3 IMPLANT
NEEDLE HYPO 25GX1X1/2 BEV (NEEDLE) ×3 IMPLANT
NS IRRIG 1000ML POUR BTL (IV SOLUTION) ×3 IMPLANT
PACK GENERAL/GYN (CUSTOM PROCEDURE TRAY) ×3 IMPLANT
PAD ABD 8X10 STRL (GAUZE/BANDAGES/DRESSINGS) ×3 IMPLANT
PAD ARMBOARD 7.5X6 YLW CONV (MISCELLANEOUS) ×3 IMPLANT
PENCIL BUTTON HOLSTER BLD 10FT (ELECTRODE) ×3 IMPLANT
POSITIONER HEAD DONUT 9IN (MISCELLANEOUS) ×3 IMPLANT
SPONGE T-LAP 18X18 ~~LOC~~+RFID (SPONGE) ×6 IMPLANT
STRIP CLOSURE SKIN 1/2X4 (GAUZE/BANDAGES/DRESSINGS) ×3 IMPLANT
SUT MNCRL AB 4-0 PS2 18 (SUTURE) ×3 IMPLANT
SUT MON AB 4-0 PC3 18 (SUTURE) ×3 IMPLANT
SUT PROLENE 2 0 SH DA (SUTURE) ×6 IMPLANT
SUT SILK 2 0 SH (SUTURE) IMPLANT
SUT VIC AB 2-0 SH 27 (SUTURE) ×1
SUT VIC AB 2-0 SH 27XBRD (SUTURE) ×2 IMPLANT
SUT VIC AB 3-0 SH 27 (SUTURE) ×1
SUT VIC AB 3-0 SH 27X BRD (SUTURE) ×2 IMPLANT
SYR 5ML LUER SLIP (SYRINGE) ×3 IMPLANT
SYR CONTROL 10ML LL (SYRINGE) ×3 IMPLANT
TOWEL GREEN STERILE (TOWEL DISPOSABLE) ×3 IMPLANT
TOWEL GREEN STERILE FF (TOWEL DISPOSABLE) ×3 IMPLANT
TRAY LAPAROSCOPIC MC (CUSTOM PROCEDURE TRAY) ×3 IMPLANT
TUBE CONNECTING 12X1/4 (SUCTIONS) IMPLANT
YANKAUER SUCT BULB TIP NO VENT (SUCTIONS) IMPLANT

## 2020-10-17 NOTE — H&P (Signed)
REFERRING PHYSICIAN: [place]  PROVIDER: Georgianne Fick, MD  Care Team: Patient Care Team: Delman Cheadle, [place] as PCP - General (Family Medicine) Delman Cheadle, [place] (Family Medicine) Georgianne Fick, MD as Consulting Provider (Surgical Oncology) Suzzanne Cloud, NP (Neurology) Marcial Pacas, MD (Neurology) Jamie Kato, MD as Consulting Provider (Radiology)   MRN: [id number] DOB: 03-Jul-1980 DATE OF ENCOUNTER: 09/18/2020  Subjective   Chief Complaint: Right Breast Cancer   History of Present Illness: Alexandra Warren is a 40 y.o. female who is seen today as an office consultation at the request of Dr. Mannie Stabile for evaluation of Right Breast Cancer .   Pt is a 40 yo F who had a screening detected mass in her first mammogram. This was an 8 mm irregular hypoechoic mass at 2 o'clock in the retroareolar location. 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 She has a maternal first cousin who was diagnosed with breast cancer at age 4. She has no other family history of cancer, and she hasn't had any cancer diagnoses before this one. She was on hormonal contraceptive pills but stopped them at diagnosis.   She has significant anxiety and chronic migraines. She also has fibromyalgia. She is on multimodal therapy for both the anxiety and the migraines. 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   Diagnostic mammogram/ultrasound 09/08/20 ACR Breast Density Category c: The breast tissue is heterogeneously dense, which may obscure small masses.   FINDINGS: Additional tomograms were performed of the right breast. There is an oval mass with slight margin irregularity in the retroareolar to slightly inner right breast measuring approximately 0.9 cm.   Targeted ultrasound of the right breast was performed. There is a cluster of cysts at 3 o'clock 1 cm from nipple measuring 1.1 x 0.4 x 0.5 cm.  There is a mildly hypoechoic mass with margin irregularity in the right breast at 2 o'clock retroareolar which may connect with adjacent duct measuring 0.6 x 0.7 x 0.8 cm. This is felt to correspond with the mass seen in the right breast at mammography.   IMPRESSION: Indeterminate 0.8 cm mass in the right breast at 2 o'clock retroareolar.   RECOMMENDATION: Recommend ultrasound-guided biopsy of the mass in the right breast at 2 o'clock retroareolar.   I have discussed the findings and recommendations with the patient. If applicable, a reminder letter will be sent to the patient regarding the next appointment.   BI-RADS CATEGORY 4: Suspicious.  Pathology core needle biopsy: 09/12/20 Breast, right, needle core biopsy, retroareolar, 2 o'clock, ribbon clip - INVASIVE DUCTAL CARCINOMA, SEE COMMENT. Microscopic Comment The carcinoma appears grade 2 and measures 5 mm in greatest linear extent  Receptors: The tumor cells are POSITIVE for Her2 (3+). Estrogen Receptor: 95%, POSITIVE, STRONG STAINING INTENSITY Progesterone Receptor: 95%, POSITIVE, STRONG STAINING INTENSITY Proliferation Marker Ki67: 15%  Review of Systems: A complete review of systems was obtained from the patient. I have reviewed this information and discussed as appropriate with the patient. See HPI as well for other ROS.  Review of Systems  Gastrointestinal: Positive for nausea.  Psychiatric/Behavioral: The patient is nervous/anxious.  All other systems reviewed and are negative.   Medical History: Past Medical History:  Diagnosis Date   Anxiety   GERD (gastroesophageal reflux disease)   Hyperlipidemia   Patient Active Problem List  Diagnosis   Generalized anxiety disorder   Depression   Insomnia   Migraine without aura  Malignant neoplasm of upper-inner quadrant of right breast in female, estrogen receptor positive (CMS-HCC)   Past Surgical History:  Procedure Laterality Date   CHOLECYSTECTOMY     Allergies  Allergen Reactions   Benadryl [Diphenhydramine Hcl] Unknown   Doxylamin-Pse-Dm-Acetaminophen Other (See Comments)  Tingling sensation in legs   Promethazine Other (See Comments)  Tingling sensation Tingling sensation   Current Outpatient Medications on File Prior to Visit  Medication Sig Dispense Refill   almotriptan (AXERT) 12.5 MG tablet TK 1 T PO QD PRN FOR MIGRAINE   amoxicillin-clavulanate (AUGMENTIN) 875-125 mg tablet TK 1 T PO Q 12 H FOR 10 DAYS (Patient not taking: Reported on 09/18/2020)   benzonatate (TESSALON) 200 MG capsule TK 1 C PO 2 TO 3 TIMES PER DAY PRF COUGH (Patient not taking: Reported on 09/18/2020)   buPROPion (WELLBUTRIN XL) 300 MG XL tablet Take 300 mg by mouth every morning   butalbital-acetaminophen-caffeine (FIORICET) 50-325-40 mg tablet TAKE 1 TABLET BY MOUTH EVERY 8 HOURS AS NEEDED FOR HEADACHE   clonazePAM (KLONOPIN) 1 MG tablet TK 1 T PO BID PRN FOR ANXIETY   diclofenac (VOLTAREN) 1 % topical gel APPLY TOPICALLY 4 TIMES DAILY TO AFFECTED AREAS (Patient not taking: Reported on 09/18/2020)   DULoxetine (CYMBALTA) 60 MG DR capsule Take 40 mg by mouth (Patient not taking: Reported on 09/18/2020)   EMGALITY PEN 120 mg/mL PnIj ADMINISTER 1 ML UNDER THE SKIN EVERY 30 DAYS   erenumab-aooe 70 mg/mL AtIn Inject subcutaneously   fluticasone propionate (FLONASE) 50 mcg/actuation nasal spray (Patient not taking: Reported on 09/18/2020)   norgestimate-ethinyl estradiol triphasic (ORTHO TRI-CYCLEN LO) 0.18/0.215/0.25 mg-25 mcg tablet Take 1 tablet by mouth once daily (Patient not taking: Reported on 09/18/2020)   NURTEC ODT 75 mg disintegrating tablet DISSOLVE ONE TABLET BY MOUTH AS NEEDED. MAX 75 MG IN 24 HOURS   omeprazole (PRILOSEC) 20 MG DR capsule Take 20 mg by mouth once daily   ondansetron (ZOFRAN) 4 MG tablet (Patient not taking: Reported on 09/18/2020)   rosuvastatin (CRESTOR) 20 MG tablet Take 20 mg by mouth every evening   tiZANidine (ZANAFLEX) 4 MG  tablet TK 1 T PO Q 8 H PRF MUSCLE SPASM   topiramate (TOPAMAX) 100 MG tablet   traMADoL (ULTRAM) 50 mg tablet TK 1 T PO Q 6 H PRN   vortioxetine (TRINTELLIX) tablet daily.   zolpidem (AMBIEN) 10 mg tablet   No current facility-administered medications on file prior to visit.   Family History  Problem Relation Age of Onset   High blood pressure (Hypertension) Father   Hyperlipidemia (Elevated cholesterol) Father   High blood pressure (Hypertension) Brother  maternal first cousin breast cancer   Social History   Tobacco Use  Smoking Status Never Smoker  Smokeless Tobacco Never Used    Social History   Socioeconomic History   Marital status: Divorced  Tobacco Use   Smoking status: Never Smoker   Smokeless tobacco: Never Used  Substance and Sexual Activity   Alcohol use: Yes  Comment: 2 glasses per month   Drug use: Never   Sexual activity: Defer   Objective:   Vitals:  09/18/20 0940  BP: 122/82  Pulse: (!) 120  Temp: 36.4 C (97.6 F)  SpO2: 97%  Weight: 80.6 kg (177 lb 9.6 oz)  Height: 167.6 cm (5' 6" )   Body mass index is 28.67 kg/m.  Gen: No acute distress. Well nourished and well groomed.  Neurological: Alert and oriented to person, place, and time. Coordination normal.  Head: Normocephalic and atraumatic.  Eyes: Conjunctivae are normal. Pupils are equal, round, and reactive to light. No scleral icterus.  Neck: Normal range of motion. Neck supple. No tracheal deviation or thyromegaly present.  Cardiovascular: Normal rate, regular rhythm, normal heart sounds and intact distal pulses. Exam reveals no gallop and no friction rub. No murmur heard. Breast: no palpable masses. Right breast larger than left. Faint bruising inner right breast. No LAD. No nipple retraction or skin dimpling.  Respiratory: Effort normal. No respiratory distress. No chest wall tenderness. Breath sounds normal. No wheezes, rales or rhonchi.  GI: Soft. Bowel sounds are normal. The abdomen  is soft and nontender. There is no rebound and no guarding.  Musculoskeletal: Normal range of motion. Extremities are nontender.  Lymphadenopathy: No cervical, preauricular, postauricular or axillary adenopathy is present Skin: Skin is warm and dry. No rash noted. No diaphoresis. No erythema. No pallor. No clubbing, cyanosis, or edema.  Psychiatric: Normal mood and affect. Behavior is normal. Judgment and thought content normal.   Labs none  Assessment and Plan:   Malignant neoplasm of upper-inner quadrant of right breast in female, estrogen receptor positive (CMS-HCC) Pt has a new diagnosis of cT1bN0 right breast cancer, triple positive. I will go ahead and refer her to oncology given the triple positive nature of the tumor.   Given her age and family history of breast cancer, I will refer her for genetics.  I will also order a breast MRI as the tumor is triple positive and her breasts are denser which is also consistent with young age.  The patient would like to pursue lumpectomy given improved recovery. I discussed seed localized lumpectomy, sentinel node, and port placement with the patient and her mother.   The surgical procedure was described to the patient. I discussed the incision type and location and that we would need radiology involved on with a wire or seed marker and/or sentinel node.   The risks and benefits of the procedure were described to the patient and she wishes to proceed.   We discussed the risks bleeding, infection, damage to other structures, need for further procedures/surgeries. We discussed the risk of seroma. The patient was advised if the area in the breast in cancer, we may need to go back to surgery for additional tissue to obtain negative margins or for a lymph node biopsy. The patient was advised that these are the most common complications, but that others can occur as well. They were advised against taking aspirin or other anti-inflammatory agents/blood  thinners the week before surgery.   Generalized anxiety disorder This is definitely already playing a role in her care. She is understandably very anxious and having issues coping with this. She is taking her clonazepam daily at this point and she has called her psychiatry office to try to get an earlier follow up appointment.   Return for breast cancer follow up.  Milus Height, MD FACS Surgical Oncology, General Surgery, Trauma and [place] [facility] [place]

## 2020-10-17 NOTE — Op Note (Signed)
Right Breast Radioactive seed localized lumpectomy and sentinel lymph node biopsy, left subclavian port a cath  Indications: This patient presents with history of right breast cancer, upper inner quadrant, grade 2 invasive ductal carcinoma, +/+/+, cT1bN0  Pre-operative Diagnosis: right breast cancer  Post-operative Diagnosis: Same  Surgeon: Stark Klein   Anesthesia: General endotracheal anesthesia  ASA Class: 2  Procedure Details  The patient was seen in the Holding Room. The risks, benefits, complications, treatment options, and expected outcomes were discussed with the patient. The possibilities of bleeding, infection, the need for additional procedures, failure to diagnose a condition, and creating a complication requiring transfusion or operation were discussed with the patient. The patient concurred with the proposed plan, giving informed consent.  The site of surgery properly noted/marked. The patient was taken to Operating Room # 9, identified, and the procedure verified as Right Breast Seed localized Lumpectomy with sentinel lymph node biopsy and port placement. A Time Out was held and the above information confirmed. The right arm, bilateral breast and chest were prepped and draped in standard fashion.    Local anesthetic was administered under the angle of the left clavicle.  The vein was accessed with 1 pass(es) of the needle. There was good venous return and the wire passed easily with no ectopy.   Fluoroscopy was used to confirm that the wire was in the vena cava.      The patient was placed back level and the area for the pocket was anethetized   with local anesthetic.  A 3-cm transverse incision was made with a #15   blade.  Cautery was used to divide the subcutaneous tissues down to the   pectoralis muscle.  An [employer] retractor was used to elevate the skin   while a pocket was created on top of the pectoralis fascia.  The port   was placed into the pocket to confirm that  it was of adequate size.  The   catheter was preattached to the port.  The port was then secured to the   pectoralis fascia with four 2-0 Prolene sutures.  These were clamped and   not tied down yet.    The catheter was tunneled through to the wire exit   site.  The catheter was placed along the wire to determine what length it should be to be in the SVC.  The catheter was cut at 25 cm.  The tunneler sheath and dilator were passed over the wire and the dilator and wire were removed.  The catheter was advanced through the tunneler sheath and the tunneler sheath was pulled away.  Care was taken to keep the catheter in the tunneler sheath as this occurred. This was advanced and the tunneler sheath was removed.  There was good venous   return and easy flush of the catheter.  The Prolene sutures were tied   down to the pectoral fascia.  The skin was reapproximated using 3-0   Vicryl interrupted deep dermal sutures.    Fluoroscopy was used to re-confirm good position of the catheter.  The skin   was then closed using 4-0 Monocryl in a subcuticular fashion.  The port was flushed with concentrated heparin flush as well.  The wounds were then cleaned, dried, and dressed with Dermabond.The lumpectomy was performed by creating a medial circumareolar incision near the previously placed radioactive seed.  Dissection was carried down to around the point of maximum signal intensity. The cautery was used to perform the dissection.  Hemostasis was  achieved with cautery. The edges of the cavity were marked with large clips, with one each medial, lateral, inferior and superior, and two clips posteriorly.   The specimen was inked with the margin marker paint kit.    Specimen radiography confirmed inclusion of the mammographic lesion, the clip, and the seed.  Margins were taken on all sides.  The background signal in the breast was zero.  The wound was irrigated and closed with 3-0 vicryl in layers and 4-0 monocryl  subcuticular suture.    Using a hand-held gamma probe, right axillary sentinel nodes were identified transcutaneously.  An oblique incision was created below the axillary hairline.  Dissection was carried through the clavipectoral fascia.  Four deep level 2 axillary sentinel nodes were removed.  Counts per second were 891, 57, 299, and 387.    The background count was 21 cps.  The wound was irrigated.  Hemostasis was achieved with cautery.  The axillary incision was closed with a 3-0 vicryl deep dermal interrupted sutures and a 4-0 monocryl subcuticular closure.    Sterile dressings were applied. At the end of the operation, all sponge, instrument, and needle counts were correct.  Findings: grossly clear surgical margins and no adenopathy, Anterior margin is skin  Estimated Blood Loss:  min         Specimens: right breast tissue with seed and four right axillary sentinel lymph nodes.             Complications:  None; patient tolerated the procedure well.         Disposition: PACU - hemodynamically stable.         Condition: stable

## 2020-10-17 NOTE — Transfer of Care (Signed)
Immediate Anesthesia Transfer of Care Note  Patient: Alexandra Warren  Procedure(s) Performed: RIGHT BREAST LUMPECTOMY WITH RADIOACTIVE SEED LOCALIZATION WITH RIGHT SENTINEL LYMPH NODE BIOPSY (Right: Breast) INSERTION PORT-A-CATH (Left: Chest)  Patient Location: PACU  Anesthesia Type:General  Level of Consciousness: awake, alert  and oriented  Airway & Oxygen Therapy: Patient Spontanous Breathing  Post-op Assessment: Report given to RN  Post vital signs: Reviewed and stable  Last Vitals:  Vitals Value Taken Time  BP 110/76   Temp    Pulse 99 10/17/20 1153  Resp 16 10/17/20 1153  SpO2 98 % 10/17/20 1153  Vitals shown include unvalidated device data.  Last Pain:  Vitals:   10/17/20 0744  TempSrc:   PainSc: 0-No pain         Complications: No notable events documented.

## 2020-10-17 NOTE — Interval H&P Note (Signed)
History and Physical Interval Note:  10/17/2020 9:00 AM  Alexandra Warren  has presented today for surgery, with the diagnosis of right breast cancer.  The various methods of treatment have been discussed with the patient and family. After consideration of risks, benefits and other options for treatment, the patient has consented to  Procedure(s): RIGHT BREAST LUMPECTOMY WITH RADIOACTIVE SEED LOCALIZATION WITH RIGHT SENTINEL LYMPH NODE BIOPSY (N/A) INSERTION PORT-A-CATH (N/A) as a surgical intervention.  The patient's history has been reviewed, patient examined, no change in status, stable for surgery.  I have reviewed the patient's chart and labs.  Questions were answered to the patient's satisfaction.     Stark Klein

## 2020-10-17 NOTE — Anesthesia Procedure Notes (Signed)
Anesthesia Regional Block: Pectoralis block   Pre-Anesthetic Checklist: , timeout performed,  Correct Patient, Correct Site, Correct Laterality,  Correct Procedure, Correct Position, site marked,  Risks and benefits discussed,  Surgical consent,  Pre-op evaluation,  At surgeon's request and post-op pain management  Laterality: Right  Prep: chloraprep       Needles:   Needle Type: Stimiplex     Needle Length: 9cm      Additional Needles:   Procedures:,,,, ultrasound used (permanent image in chart),,    Narrative:  Start time: 10/17/2020 8:10 AM End time: 10/17/2020 8:31 AM Injection made incrementally with aspirations every 5 mL.  Performed by: Personally  Anesthesiologist: Nolon Nations, MD  Additional Notes: BP cuff, SpO2 and EKG monitors applied. Sedation begun.  Anesthetic injected incrementally, slowly, and after neg aspirations under direct ultrasound guidance. Good fascial spread noted. Patient tolerated well.

## 2020-10-17 NOTE — Discharge Instructions (Addendum)
[facility] Office Phone Number [phone]  BREAST BIOPSY/ PARTIAL MASTECTOMY: POST OP INSTRUCTIONS  Always review your discharge instruction sheet given to you by the facility where your surgery was performed.  IF YOU HAVE DISABILITY OR FAMILY LEAVE FORMS, YOU MUST BRING THEM TO THE OFFICE FOR PROCESSING.  DO NOT GIVE THEM TO YOUR DOCTOR.  A prescription for pain medication may be given to you upon discharge.  Take your pain medication as prescribed, if needed.  If narcotic pain medicine is not needed, then you may take acetaminophen (Tylenol) or ibuprofen (Advil) as needed. Take your usually prescribed medications unless otherwise directed If you need a refill on your pain medication, please contact your pharmacy.  They will contact our office to request authorization.  Prescriptions will not be filled after 5pm or on week-ends. You should eat very light the first 24 hours after surgery, such as soup, crackers, pudding, etc.  Resume your normal diet the day after surgery. Most patients will experience some swelling and bruising in the breast.  Ice packs and a good support bra will help.  Swelling and bruising can take several days to resolve.  It is common to experience some constipation if taking pain medication after surgery.  Increasing fluid intake and taking a stool softener will usually help or prevent this problem from occurring.  A mild laxative (Milk of Magnesia or Miralax) should be taken according to package directions if there are no bowel movements after 48 hours. Unless discharge instructions indicate otherwise, you may remove your bandages 48 hours after surgery, and you may shower at that time.  You may have steri-strips (small skin tapes) in place directly over the incision.  These strips should be left on the skin for 7-10 days.   Any sutures or staples will be removed at the office during your follow-up visit. ACTIVITIES:  You may resume regular daily activities  (gradually increasing) beginning the next day.  Wearing a good support bra or sports bra (or the breast binder) minimizes pain and swelling.  You may have sexual intercourse when it is comfortable. You may drive when you no longer are taking prescription pain medication, you can comfortably wear a seatbelt, and you can safely maneuver your car and apply brakes. RETURN TO WORK:  __________1 week_______________ You should see your doctor in the office for a follow-up appointment approximately two weeks after your surgery.  Your doctor's nurse will typically make your follow-up appointment when she calls you with your pathology report.  Expect your pathology report 2-3 business days after your surgery.  You may call to check if you do not hear from us after three days.   WHEN TO CALL YOUR DOCTOR: Fever over 101.0 Nausea and/or vomiting. Extreme swelling or bruising. Continued bleeding from incision. Increased pain, redness, or drainage from the incision.  The clinic staff is available to answer your questions during regular business hours.  Please don't hesitate to call and ask to speak to one of the nurses for clinical concerns.  If you have a medical emergency, go to the nearest emergency room or call 911.  A surgeon from [facility] is always on call at the hospital.  For further questions, please visit [email or web address]   

## 2020-10-17 NOTE — Anesthesia Preprocedure Evaluation (Addendum)
Anesthesia Evaluation  Patient identified by MRN, date of birth, ID band Patient awake    Reviewed: Allergy & Precautions, NPO status , Patient's Chart, lab work & pertinent test results  Airway Mallampati: II  TM Distance: >3 FB Neck ROM: Full    Dental  (+) Dental Advisory Given, Teeth Intact   Pulmonary neg pulmonary ROS,    Pulmonary exam normal breath sounds clear to auscultation       Cardiovascular negative cardio ROS Normal cardiovascular exam Rhythm:Regular Rate:Normal     Neuro/Psych  Headaches, PSYCHIATRIC DISORDERS Anxiety Depression    GI/Hepatic Neg liver ROS, GERD  ,  Endo/Other  negative endocrine ROS  Renal/GU negative Renal ROS     Musculoskeletal  (+) Fibromyalgia -  Abdominal   Peds  Hematology negative hematology ROS (+)   Anesthesia Other Findings   Reproductive/Obstetrics                            Anesthesia Physical Anesthesia Plan  ASA: 2  Anesthesia Plan: General   Post-op Pain Management: GA combined w/ Regional for post-op pain   Induction: Intravenous  PONV Risk Score and Plan: 4 or greater and Ondansetron, Treatment may vary due to age or medical condition, Dexamethasone, Propofol infusion, Midazolam and Scopolamine patch - Pre-op  Airway Management Planned: LMA  Additional Equipment: None  Intra-op Plan:   Post-operative Plan: Extubation in OR  Informed Consent: I have reviewed the patients History and Physical, chart, labs and discussed the procedure including the risks, benefits and alternatives for the proposed anesthesia with the patient or authorized representative who has indicated his/her understanding and acceptance.     Dental advisory given  Plan Discussed with: CRNA  Anesthesia Plan Comments:        Anesthesia Quick Evaluation

## 2020-10-17 NOTE — Anesthesia Procedure Notes (Signed)
Procedure Name: LMA Insertion Date/Time: 10/17/2020 9:23 AM Performed by: Barrington Ellison, CRNA Pre-anesthesia Checklist: Patient identified, Emergency Drugs available, Suction available and Patient being monitored Patient Re-evaluated:Patient Re-evaluated prior to induction Oxygen Delivery Method: Circle System Utilized Preoxygenation: Pre-oxygenation with 100% oxygen Induction Type: IV induction Ventilation: Mask ventilation without difficulty LMA: LMA inserted LMA Size: 4.0 Number of attempts: 1 Placement Confirmation: positive ETCO2 Tube secured with: Tape Dental Injury: Teeth and Oropharynx as per pre-operative assessment

## 2020-10-18 ENCOUNTER — Encounter (HOSPITAL_COMMUNITY): Payer: Self-pay | Admitting: General Surgery

## 2020-10-18 NOTE — Anesthesia Postprocedure Evaluation (Signed)
Anesthesia Post Note  Patient: Alexandra Warren  Procedure(s) Performed: RIGHT BREAST LUMPECTOMY WITH RADIOACTIVE SEED LOCALIZATION WITH RIGHT SENTINEL LYMPH NODE BIOPSY (Right: Breast) INSERTION PORT-A-CATH (Left: Chest)     Patient location during evaluation: PACU Anesthesia Type: General Level of consciousness: sedated and patient cooperative Pain management: pain level controlled Vital Signs Assessment: post-procedure vital signs reviewed and stable Respiratory status: spontaneous breathing Cardiovascular status: stable Anesthetic complications: no   No notable events documented.  Last Vitals:  Vitals:   10/17/20 1230 10/17/20 1245  BP: 104/69 (!) 108/57  Pulse: 97 92  Resp: 16 13  Temp:    SpO2: 99% 99%    Last Pain:  Vitals:   10/17/20 1230  TempSrc:   PainSc: 0-No pain                 Nolon Nations

## 2020-10-19 ENCOUNTER — Other Ambulatory Visit: Payer: Self-pay | Admitting: General Surgery

## 2020-10-19 ENCOUNTER — Telehealth: Payer: Self-pay | Admitting: General Surgery

## 2020-10-19 LAB — SURGICAL PATHOLOGY

## 2020-10-19 NOTE — Telephone Encounter (Signed)
erroneous

## 2020-10-29 ENCOUNTER — Other Ambulatory Visit: Payer: Self-pay

## 2020-10-29 ENCOUNTER — Encounter
Admission: RE | Admit: 2020-10-29 | Discharge: 2020-10-29 | Disposition: A | Discharge status: Home / Self Care | Payer: No Typology Code available for payment source | Source: Ambulatory Visit | Attending: Internal Medicine | Admitting: Internal Medicine

## 2020-10-29 DIAGNOSIS — Z9221 Personal history of antineoplastic chemotherapy: Secondary | ICD-10-CM

## 2020-10-29 DIAGNOSIS — Z170 Estrogen receptor positive status [ER+]: Secondary | ICD-10-CM | POA: Insufficient documentation

## 2020-10-29 DIAGNOSIS — C50211 Malignant neoplasm of upper-inner quadrant of right female breast: Secondary | ICD-10-CM | POA: Diagnosis not present

## 2020-10-29 MED ORDER — TECHNETIUM TC 99M-LABELED RED BLOOD CELLS IV KIT
22.4100 | PACK | Freq: Once | INTRAVENOUS | Status: AC | PRN
  Administered 2020-10-29: 22.41 via INTRAVENOUS

## 2020-11-07 ENCOUNTER — Telehealth: Payer: Self-pay | Admitting: Neurology

## 2020-11-07 NOTE — Telephone Encounter (Signed)
Sent mychart message to patient that her appointment was pushed out due to sarah being on leave still.

## 2020-11-09 ENCOUNTER — Other Ambulatory Visit: Payer: Self-pay

## 2020-11-09 ENCOUNTER — Other Ambulatory Visit: Payer: No Typology Code available for payment source

## 2020-11-09 ENCOUNTER — Inpatient Hospital Stay: Payer: No Typology Code available for payment source | Attending: Internal Medicine | Admitting: Internal Medicine

## 2020-11-09 DIAGNOSIS — Z170 Estrogen receptor positive status [ER+]: Secondary | ICD-10-CM

## 2020-11-09 DIAGNOSIS — Z808 Family history of malignant neoplasm of other organs or systems: Secondary | ICD-10-CM | POA: Diagnosis not present

## 2020-11-09 DIAGNOSIS — G43909 Migraine, unspecified, not intractable, without status migrainosus: Secondary | ICD-10-CM | POA: Diagnosis not present

## 2020-11-09 DIAGNOSIS — Z800 Family history of malignant neoplasm of digestive organs: Secondary | ICD-10-CM | POA: Diagnosis not present

## 2020-11-09 DIAGNOSIS — C50211 Malignant neoplasm of upper-inner quadrant of right female breast: Secondary | ICD-10-CM

## 2020-11-09 DIAGNOSIS — Z8541 Personal history of malignant neoplasm of cervix uteri: Secondary | ICD-10-CM | POA: Diagnosis not present

## 2020-11-09 LAB — URINALYSIS, COMPLETE (UACMP) WITH MICROSCOPIC
Bacteria, UA: NONE SEEN
Bilirubin Urine: NEGATIVE
Glucose, UA: NEGATIVE mg/dL
Hgb urine dipstick: NEGATIVE
Ketones, ur: 5 mg/dL — AB
Leukocytes,Ua: NEGATIVE
Nitrite: POSITIVE — AB
Protein, ur: NEGATIVE mg/dL
Specific Gravity, Urine: 1.015 (ref 1.005–1.030)
pH: 6 (ref 5.0–8.0)

## 2020-11-09 MED ORDER — LIDOCAINE-PRILOCAINE 2.5-2.5 % EX CREA: 1.0000 "application " | TOPICAL_CREAM | CUTANEOUS | 0 refills | Status: DC | PRN

## 2020-11-09 MED ORDER — ONDANSETRON HCL 8 MG PO TABS: 8.0000 mg | ORAL_TABLET | Freq: Three times a day (TID) | ORAL | 3 refills | Status: DC | PRN

## 2020-11-09 NOTE — Progress Notes (Signed)
Pt states she has been treated for UTI with 3 days of cipro but feels as if it has not resolved. Pt requesting EMLA cream and would like to know when her next MUGA scan will be. Pt requests rx for valium when she goes for MUGA

## 2020-11-09 NOTE — Progress Notes (Signed)
START ON PATHWAY REGIMEN - Breast     Cycle 1: A cycle is 7 days:     Trastuzumab-xxxx      Paclitaxel    Cycles 2 through 12: A cycle is every 7 days:     Trastuzumab-xxxx      Paclitaxel    Cycles 13 through 25: A cycle is every 21 days:     Trastuzumab-xxxx   **Always confirm dose/schedule in your pharmacy ordering system**  Patient Characteristics: Postoperative without Neoadjuvant Therapy (Pathologic Staging), Invasive Disease, Adjuvant Therapy, HER2 Positive, ER Positive, Node Negative, pT1b, pN0/N53m, Chemotherapy Indicated Therapeutic Status: Postoperative without Neoadjuvant Therapy (Pathologic Staging) AJCC Grade: G2 AJCC N Category: pN0 AJCC M Category: cM0 ER Status: Positive (+) AJCC 8 Stage Grouping: IA HER2 Status: Positive (+) Oncotype Dx Recurrence Score: Not Appropriate AJCC T Category: pT1b PR Status: Positive (+) Adjuvant Therapy Status: No Adjuvant Therapy Received Yet or Changing Initial Adjuvant Regimen due to Tolerance Intervention Indicated: Chemotherapy Intent of Therapy: Curative Intent, Discussed with Patient

## 2020-11-09 NOTE — Progress Notes (Signed)
DISCONTINUE ON PATHWAY REGIMEN - Breast     Cycle 1: A cycle is 7 days:     Trastuzumab-xxxx      Paclitaxel    Cycles 2 through 12: A cycle is every 7 days:     Trastuzumab-xxxx      Paclitaxel    Cycles 13 through 25: A cycle is every 21 days:     Trastuzumab-xxxx   **Always confirm dose/schedule in your pharmacy ordering system**  REASON: Other Reason PRIOR TREATMENT: [id number]: Weekly Paclitaxel + Trastuzumab x 12 Weeks, Followed by Trastuzumab Maintenance q21 Days x 13 Cycles TREATMENT RESPONSE: N/A - Adjuvant Therapy  START ON PATHWAY REGIMEN - Breast     Cycle 1: A cycle is 7 days:     Trastuzumab-xxxx      Paclitaxel    Cycles 2 through 12: A cycle is every 7 days:     Trastuzumab-xxxx      Paclitaxel    Cycles 13 through 25: A cycle is every 21 days:     Trastuzumab-xxxx   **Always confirm dose/schedule in your pharmacy ordering system**  Patient Characteristics: Postoperative without Neoadjuvant Therapy (Pathologic Staging), Invasive Disease, Adjuvant Therapy, HER2 Positive, ER Positive, Node Negative, pT1b, pN0/N1mi, Chemotherapy Indicated Therapeutic Status: Postoperative without Neoadjuvant Therapy (Pathologic Staging) AJCC Grade: G2 AJCC N Category: pN0 AJCC M Category: cM0 ER Status: Positive (+) AJCC 8 Stage Grouping: IA HER2 Status: Positive (+) Oncotype Dx Recurrence Score: Not Appropriate AJCC T Category: pT1b PR Status: Positive (+) Adjuvant Therapy Status: No Adjuvant Therapy Received Yet or Changing Initial Adjuvant Regimen due to Tolerance Intervention Indicated: Chemotherapy Intent of Therapy: Curative Intent, Discussed with Patient 

## 2020-11-09 NOTE — Progress Notes (Signed)
[facility] [place] NOTE  Patient Care Team: [name], MD as PCP - General (Family Medicine)  CHIEF COMPLAINTS/PURPOSE OF CONSULTATION: Breast cancer  #  Oncology History Overview Note   #Right breast-invasive mammary carcinoma 2 o'clock position #1.1 cm-ER/PR HER2/neu positive [3+]; SEP, 2022-MRI-up to 3 cm non-mass enhancement noted.  #Left breast- MRI #upper outer quadrant- 54m #retroareolar 1cm-. NEGATIVE for malignancy   EProcedure: Lumpectomy  Specimen Laterality: Right  Histologic Type: Invasive ductal carcinoma  Histologic Grade:       Glandular (Acinar)/Tubular Differentiation: 3       Nuclear Pleomorphism: 2       Mitotic Rate: 1       Overall Grade: 2  Tumor Size: 0.9 cm  Ductal Carcinoma In Situ: Present, intermediate grade  Treatment Effect in the Breast: No known presurgical therapy  Margins: All margins negative for invasive carcinoma       Distance from Closest Margin (mm): Cannot be determined  DCIS Margins: Uninvolved by DCIS       Distance from Closest Margin (mm): Cannot be determined  Regional Lymph Nodes:       Number of Lymph Nodes Examined: 5       Number of Sentinel Nodes Examined: 5       Number of Lymph Nodes with Macrometastases (>2 mm): 0       Number of Lymph Nodes with Micrometastases: 0       Number of Lymph Nodes with Isolated Tumor Cells (=0.2 mm or =200  cells): 0       Size of Largest Metastatic Deposit (mm): Not applicable       Extranodal Extension: Not applicable  Distant Metastasis:       Distant Site(s) Involved: Not applicable  Breast Biomarker Testing Performed on Previous Biopsy:       Testing Performed on Case Number: [id number]             Estrogen Receptor: 95%, positive, strong staining intensity             Progesterone Receptor: 95%, positive, strong staining  intensity             HER2: Positive (3+)             Ki-67: 15%  Pathologic Stage Classification (pTNM, AJCC 8th Edition): pT1b, pN0   Representative Tumor Block: A4  Comment(s): None   ---------------------------------   # Headaches-Migraine/ Anxiety [[name]; Dr.Yan  botox]  #Genetic testing-s/p genetic counseling-expanded panel negative.   Carcinoma of upper-inner quadrant of right breast in [place], [place]  09/[place]/2020 Initial Diagnosis   Malignant ne[place] of upper-inner quadrant of right female breast [place]   10/01/2020 Genetic Testing   Negative hereditary cancer genetic testing: no pathogenic variants detected in [name] and [name].  The report dates are October 01, 2020 and October 11, 2020, respectively.    The BRCAplus panel offered by [name] includes sequencing and deletion/duplication analysis for the following 8 genes: ATM, BRCA1, BRCA2, CDH1, CHEK2, PALB2, PTEN, and TP53.  The CustomNext-Cancer+RNAinsight panel offered by [employer] sequencing and rearrangement analysis for the following 47 genes:  APC, ATM, AXIN2, BARD1, BMPR1A, BRCA1, BRCA2, BRIP1, CDH1, CDK4, CDKN2A, CHEK2, DICER1, EPCAM, GREM1, HOXB13, MEN1, MLH1, MSH2, MSH3, MSH6, MUTYH, NBN, NF1, NF2, NTHL1, PALB2, PMS2, POLD1, POLE, PTEN, RAD51C, RAD51D, RECQL, RET, SDHA, SDHAF2, SDHB, SDHC, SDHD, SMAD4, SMARCA4, STK11, TP53, TSC1, TSC2, and VHL.  RNA data is routinely  analyzed for use in variant interpretation for all genes.   11/09/2020 -  Chemotherapy   Patient is on Treatment Plan : BREAST Paclitaxel + Trastuzumab q7d / Trastuzumab q21d        HISTORY OF PRESENTING ILLNESS: Ambulating independently; alone.  Alexandra Warren 40 y.o.  female patient with newly diagnosed breast cancer is here today with results of her pathology/surgery.  Patient underwent lumpectomy-without any postoperative complications.  Patient recently diagnosed with UTI; s/p antibiotics prophylaxis.  However patient continues to complain of increased frequency of  urination dysuria.  Subjective feverishness.   Review of Systems  Constitutional:  Negative for chills, diaphoresis, fever, malaise/fatigue and weight loss.  HENT:  Negative for nosebleeds and sore throat.   Eyes:  Negative for double vision.  Respiratory:  Negative for cough, hemoptysis, sputum production, shortness of breath and wheezing.   Cardiovascular:  Negative for chest pain, palpitations, orthopnea and leg swelling.  Gastrointestinal:  Negative for abdominal pain, blood in stool, constipation, diarrhea, heartburn, melena, nausea and vomiting.  Genitourinary:  Negative for dysuria, frequency and urgency.  Musculoskeletal:  Negative for back pain and joint pain.  Skin: Negative.  Negative for itching and rash.  Neurological:  Positive for headaches. Negative for dizziness, tingling, focal weakness and weakness.  Endo/Heme/Allergies:  Does not bruise/bleed easily.  Psychiatric/Behavioral:  Negative for depression. The patient is nervous/anxious. The patient does not have insomnia.   All other systems reviewed and are negative.   MEDICAL HISTORY:  Past Medical History:  Diagnosis Date   Acid reflux    Anxiety and depression    Dr. Gala Murdoch, MD   Back pain    Family history of breast cancer 09/21/2020   Family history of malignant neoplasm of digestive organ 09/21/2020   Fibromyalgia    History of gallstones    Malignant neoplasm of upper-inner quadrant of right female breast ([place]) 09/21/2020   Migraine     SURGICAL HISTORY: Past Surgical History:  Procedure Laterality Date   BREAST LUMPECTOMY WITH RADIOACTIVE SEED LOCALIZATION Right 10/17/2020   Procedure: RIGHT BREAST LUMPECTOMY WITH RADIOACTIVE SEED LOCALIZATION WITH RIGHT SENTINEL LYMPH NODE BIOPSY;  Surgeon: Stark Klein, MD;  Location: [facility];  Service: General;  Laterality: Right;   CHOLECYSTECTOMY  12/2004   PORTACATH PLACEMENT Left 10/17/2020   Procedure: INSERTION PORT-A-CATH;  Surgeon: Stark Klein, MD;  Location:  [facility] OR;  Service: General;  Laterality: Left;    SOCIAL HISTORY: Social History   Socioeconomic History   Marital status: Divorced    Spouse name: Corene Cornea   Number of children: 0   Years of education: MS   Highest education level: Not on file  Occupational History   Occupation: Optometrist  Tobacco Use   Smoking status: Never   Smokeless tobacco: Never  Vaping Use   Vaping Use: Never used  Substance and Sexual Activity   Alcohol use: Yes    Comment: pt states 1 glass of wine maybe once a month   Drug use: No   Sexual activity: Yes    Birth control/protection: Condom  Other Topics Concern   Not on file  Social History Narrative   Accountant for commercial firm; wants short term disability;  never smoked; 2 drinks a month. Lives in [place] with boy friend [[place] No children. Has one brother    Social Determinants of Radio broadcast assistant Strain: Not on file  Food Insecurity: Not on file  Transportation Needs: Not on file  Physical Activity: Not on file  Stress: Not on file  Social Connections: Not on file  Intimate Partner Violence: Not on file    FAMILY HISTORY: Family History  Problem Relation Age of Onset   Healthy Mother    Hypertension Father    Skin cancer Brother        dx before 80   Colon cancer Maternal Aunt 62   Cervical cancer Maternal Aunt        dx before 39; x2   Cancer Paternal Grandmother 99       unknown primary; ? liver   Lung cancer Paternal Grandfather        dx after 70   Breast cancer Cousin 78       maternal cousin   Colon cancer Other        MGM's sister   Gastric cancer Other        PGM's sister   Leukemia Other        PMG's sister   Rectal cancer Neg Hx    Esophageal cancer Neg Hx     ALLERGIES:  is allergic to diphenhydramine hcl, promethazine, and pseudoeph-doxylamine-dm-apap.  MEDICATIONS:  Current Outpatient Medications  Medication Sig Dispense Refill   almotriptan (AXERT) 12.5 MG tablet Take  1 tab at onset of migraine.  May repeat in 2 hrs, if needed.  Max dose: 2 tabs/day. This is a 30 day prescription. 12 tablet 11   azelastine (ASTELIN) 0.1 % nasal spray Place 1 spray into both nostrils daily as needed for rhinitis.     BOTOX 200 units SOLR Inject 200 Units into the skin See admin instructions. Every 90 days     buPROPion (WELLBUTRIN XL) 300 MG 24 hr tablet Take 300 mg by mouth every morning.     butalbital-acetaminophen-caffeine (FIORICET) 50-325-40 MG tablet Take 1 tablet by mouth every 8 (eight) hours as needed for headache. 10 tablet 3   clonazePAM (KLONOPIN) 1 MG tablet Take 0.5-1 mg by mouth 2 (two) times daily as needed for anxiety.     diazepam (VALIUM) 5 MG tablet One pill 45-60 mins prior to procedure; 1 pill 15 mins prior if needed. 6 tablet 0   EMGALITY 120 MG/ML SOAJ Inject 120 mg into the skin every 30 (thirty) days.     lidocaine-prilocaine (EMLA) cream Apply 1 application topically as needed. 30 g 0   omeprazole (PRILOSEC) 20 MG capsule Take 20 mg by mouth daily.     OVER THE COUNTER MEDICATION Take 1 tablet by mouth daily. prevent a migraine     oxyCODONE (OXY IR/ROXICODONE) 5 MG immediate release tablet Take 1 tablet (5 mg total) by mouth every 6 (six) hours as needed for severe pain. 10 tablet 0   Rimegepant Sulfate (NURTEC) 75 MG TBDP Take 75 mg by mouth as needed (Take 1 at onset of headache, max is 75 mg in 24 hours). 8 tablet 11   rosuvastatin (CRESTOR) 20 MG tablet Take 20 mg by mouth daily.     tiZANidine (ZANAFLEX) 4 MG tablet Take 1 tablet (4 mg total) by mouth every 8 (eight) hours as needed for muscle spasms (migraines). This is a 30 day rx. 20 tablet 5   topiramate (TOPAMAX) 100 MG tablet Take 1 tablet (100 mg total) by mouth 2 (two) times daily. (Patient taking differently: Take 200 mg by mouth at bedtime.) 60 tablet 11   traMADol (ULTRAM) 50 MG tablet Take 50 mg by mouth every 6 (six) hours as needed  for pain.     vortioxetine HBr (TRINTELLIX) 20 MG  TABS tablet Take 20 mg by mouth daily.  0   zolpidem (AMBIEN) 10 MG tablet Take 10 mg by mouth at bedtime as needed for sleep.     ondansetron (ZOFRAN) 8 MG tablet Take 1 tablet (8 mg total) by mouth every 8 (eight) hours as needed for nausea or vomiting. 30 tablet 3   No current facility-administered medications for this visit.      Marland Kitchen  PHYSICAL EXAMINATION: ECOG PERFORMANCE STATUS: 0 - Asymptomatic  Vitals:   11/09/20 1514  BP: 117/86  Pulse: 93  Resp: 16  Temp: 98.1 F (36.7 C)  SpO2: 97%   Filed Weights   11/09/20 1514  Weight: 180 lb (81.6 kg)    Physical Exam Vitals and nursing note reviewed.  HENT:     Head: Normocephalic and atraumatic.     Mouth/Throat:     Pharynx: Oropharynx is clear.  Eyes:     Extraocular Movements: Extraocular movements intact.     Pupils: Pupils are equal, round, and reactive to light.  Cardiovascular:     Rate and Rhythm: Normal rate and regular rhythm.  Abdominal:     Palpations: Abdomen is soft.  Musculoskeletal:        General: Normal range of motion.     Cervical back: Normal range of motion.  Skin:    General: Skin is warm.  Neurological:     General: No focal deficit present.     Mental Status: She is alert and oriented to person, place, and time.  Psychiatric:        Behavior: Behavior normal.        Judgment: Judgment normal.     LABORATORY DATA:  I have reviewed the data as listed Lab Results  Component Value Date   WBC 7.9 10/10/2020   HGB 15.1 (H) 10/10/2020   HCT 46.3 (H) 10/10/2020   MCV 92.4 10/10/2020   PLT 419 (H) 10/10/2020   Recent Labs    01/24/20 1620 10/10/20 1435  NA 136 136  K 3.6 3.7  CL 105 105  CO2 19* 23  GLUCOSE 142* 107*  BUN 8 9  CREATININE 0.71 0.86  CALCIUM 8.7* 9.2  GFRNONAA >60 >60    RADIOGRAPHIC STUDIES: I have personally reviewed the radiological images as listed and agreed with the findings in the report. NM Cardiac Muga Rest  Result Date: 10/29/2020 CLINICAL DATA:   Chemotherapy patient.  Assess LV function. EXAM: NUCLEAR MEDICINE CARDIAC BLOOD POOL IMAGING (MUGA) TECHNIQUE: Cardiac multi-gated acquisition was performed at rest following intravenous injection of Tc-110mlabeled red blood cells. RADIOPHARMACEUTICALS:  22.4 mCi Tc-956mertechnetate in-vitro labeled red blood cells IV COMPARISON:  None. FINDINGS: No wall motion abnormality identified. The left ventricular ejection fraction was calculated at 58%. IMPRESSION: Baseline left ventricular ejection fraction is equal to 58%. Electronically Signed   By: TaKerby Moors.D.   On: 10/29/2020 14:35   NM Sentinel Node Inj-No Rpt (Breast)  Result Date: 10/17/2020 Sulfur Colloid was injected by the Nuclear Medicine Technologist for sentinel lymph node localization.   MM Breast Surgical Specimen  Result Date: 10/17/2020 CLINICAL DATA:  Evaluate surgical specimen following RIGHT lumpectomy for breast cancer. EXAM: SPECIMEN RADIOGRAPH OF THE RIGHT BREAST COMPARISON:  Previous exam(s). FINDINGS: Status post excision of the RIGHT breast. The radioactive seed and RIBBON biopsy marker clip are present, completely intact, and were marked for pathology. IMPRESSION: Specimen radiograph of the RIGHT breast.  Electronically Signed   By: Margarette Canada M.D.   On: 10/17/2020 10:36  DG CHEST PORT 1 VIEW  Result Date: 10/17/2020 CLINICAL DATA:  Postop Port-A-Cath placement EXAM: PORTABLE CHEST 1 VIEW COMPARISON:  01/24/2020 FINDINGS: Interval placement of left Port-A-Cath. The tip is at the cavoatrial junction. No pneumothorax. Lungs clear. No effusions or acute bony abnormality. IMPRESSION: Left Port-A-Cath tip at the cavoatrial junction.  No pneumothorax. Electronically Signed   By: Rolm Baptise M.D.   On: 10/17/2020 13:49   DG Fluoro Guide CV Line-No Report  Result Date: 10/17/2020 Fluoroscopy was utilized by the requesting physician.  No radiographic interpretation.   MM RT RADIOACTIVE SEED LOC MAMMO GUIDE  Result Date:  10/16/2020 CLINICAL DATA:  Patient for preoperative localization prior to right lumpectomy. EXAM: MAMMOGRAPHIC GUIDED RADIOACTIVE SEED LOCALIZATION OF THE RIGHT BREAST COMPARISON:  Previous exam(s). FINDINGS: Patient presents for radioactive seed localization prior to right lumpectomy. I met with the patient and we discussed the procedure of seed localization including benefits and alternatives. We discussed the high likelihood of a successful procedure. We discussed the risks of the procedure including infection, bleeding, tissue injury and further surgery. We discussed the low dose of radioactivity involved in the procedure. Informed, written consent was given. The usual time-out protocol was performed immediately prior to the procedure. Using mammographic guidance, sterile technique, 1% lidocaine and an I-125 radioactive seed, ribbon shaped clip was localized using a medial approach. The follow-up mammogram images confirm the seed in the expected location and were marked for Dr. Barry Dienes. Follow-up survey of the patient confirms presence of the radioactive seed. Order number of I-125 seed:  [id number]. Total activity:  0.211 millicuries reference Date: September 26, 2020 The patient tolerated the procedure well and was released from the [facility]. She was given instructions regarding seed removal. IMPRESSION: Radioactive seed localization right breast. No apparent complications. Electronically Signed   By: Lovey Newcomer M.D.   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  I reviewed the staging pathology with the patient in detail.   #Given adjuvantly patient will need Taxol [name]-weekly x12 followed by [name] every 3 weeks for 12 months.  MUGA scan- 58%;   Discussed the potential side effects including but not  limited to-increasing fatigue, nausea vomiting, diarrhea, hair loss, sores in the mouth, increase risk of infection and also neuropathy.   # Headaches/Migraines-on Fioricet/amitriptyline; and also Emgalti SQ. Stable.  #Reproductive counseling: Given the effect of chemotherapy and ovarian function-discussed regarding evaluation with reproductive endocrinology.  Patient states that she does not plan to have children; and declines any further evaluation.  # Disability-given patient's extreme anxiety/headaches- disbiality until nov; will need further paper below  # increased frequency/dysuria- recent Ciprofloxacin [3 days]; not resolved; check a UA and a culture.  # DISPOSITION: # chemo education re: Taxol-herceptin # UA and culture today # follow up in 2 weeks- MD; labs- cbc/cmp; Urine pregnancy test; taxol-[name] weekly- Dr.B   All questions were answered. The patient/family knows to call the clinic with any problems, questions or concerns.     GCammie Sickle MD 11/09/2020 4:41 PM

## 2020-11-09 NOTE — Assessment & Plan Note (Addendum)
#  STAGE-I- Right breast-invasive mammary carcinoma- pT1bN0 [56mm]- ER/PR-Positive; Her-2 positive.  I reviewed the staging pathology with the patient in detail.   #Given adjuvantly patient will need Taxol [name]-weekly x12 followed by [name] every 3 weeks for 12 months.  MUGA scan- 58%;   Discussed the potential side effects including but not limited to-increasing fatigue, nausea vomiting, diarrhea, hair loss, sores in the mouth, increase risk of infection and also neuropathy.   # Headaches/Migraines-on Fioricet/amitriptyline; and also Emgalti SQ. Stable.  #Reproductive counseling: Given the effect of chemotherapy and ovarian function-discussed regarding evaluation with reproductive endocrinology.  Patient states that she does not plan to have children; and declines any further evaluation.  # Disability-given patient's extreme anxiety/headaches- disbiality until nov; will need further paper below  # increased frequency/dysuria- recent Ciprofloxacin [3 days]; not resolved; check a UA and a culture.  # DISPOSITION: # chemo education re: Taxol-herceptin # UA and culture today # follow up in 2 weeks- MD; labs- cbc/cmp; Urine pregnancy test; taxol-[name] weekly- Dr.B

## 2020-11-11 LAB — URINE CULTURE

## 2020-11-12 ENCOUNTER — Encounter: Payer: Self-pay | Admitting: Internal Medicine

## 2020-11-13 ENCOUNTER — Inpatient Hospital Stay: Payer: No Typology Code available for payment source

## 2020-11-13 ENCOUNTER — Other Ambulatory Visit: Payer: Self-pay

## 2020-11-14 ENCOUNTER — Encounter: Payer: Self-pay | Admitting: Neurology

## 2020-11-14 ENCOUNTER — Ambulatory Visit (INDEPENDENT_AMBULATORY_CARE_PROVIDER_SITE_OTHER): Payer: No Typology Code available for payment source | Admitting: Neurology

## 2020-11-14 VITALS — BP 107/76 | HR 70 | Ht 66.0 in | Wt 180.0 lb

## 2020-11-14 DIAGNOSIS — G43009 Migraine without aura, not intractable, without status migrainosus: Secondary | ICD-10-CM

## 2020-11-14 MED ORDER — ONABOTULINUMTOXINA 100 UNITS IJ SOLR
100.0000 [IU] | Freq: Once | INTRAMUSCULAR | Status: AC
  Administered 2020-11-14: 100 [IU] via INTRAMUSCULAR

## 2020-11-14 MED ORDER — EMGALITY 120 MG/ML ~~LOC~~ SOAJ: 120.0000 mg | SUBCUTANEOUS | 4 refills | Status: DC

## 2020-11-14 MED ORDER — ALMOTRIPTAN MALATE 12.5 MG PO TABS: ORAL_TABLET | ORAL | 11 refills | Status: DC

## 2020-11-14 MED ORDER — BUTALBITAL-APAP-CAFFEINE 50-325-40 MG PO TABS: 1.0000 | ORAL_TABLET | Freq: Three times a day (TID) | ORAL | 5 refills | Status: DC | PRN

## 2020-11-14 MED ORDER — ONABOTULINUMTOXINA 100 UNITS IJ SOLR: 200.0000 [IU] | Freq: Once | INTRAMUSCULAR | Status: DC

## 2020-11-14 NOTE — Progress Notes (Signed)
Botox- 200 units x 1 vial Lot: [id number] Expiration: 04/2023 NDC: 6063-0160-10     B/B

## 2020-11-14 NOTE — Progress Notes (Signed)
     BOTOX PROCEDURE NOTE FOR MIGRAINE HEADACHE   HISTORY: Alexandra Warren is a 40 year old female with history of chronic migraine headache, presents today for her third Botox injection.  Her migraine is controlled with Botox injection and Emgality as preventive medications, using xerta as abortive treatment, occasionally uses Fioricet as needed,  She was diagnosed with right breast cancer, September 2022, MRI up to 3 cm non-mass enhancement noted, had lumpectomy, is going to start chemotherapy in November   Description of procedure:  Botox injection for chronic migraine prevention, injection was performed according to [facility] protocol,  5 units of Botox was injected into each side,     Bilateral corrugate 2 injection sites Procerus 1 injection sites. Bilateral temporalis 8 injection sites Bilateral occipitalis 6 injection sites Bilateral cervical paraspinals 4 injection sites Bilateral upper trapezius 6 injection sites  Extra 60 unites were injected into bilateral temporoparietal region, and bilateral levator scapula  Refilled Fioricet, Emgality, axert

## 2020-11-16 NOTE — Progress Notes (Signed)
Pharmacist Chemotherapy Monitoring - Initial Assessment    Anticipated start date: 11/23/20   The following has been reviewed per standard work regarding the patient's treatment regimen: The patient's diagnosis, treatment plan and drug doses, and organ/hematologic function Lab orders and baseline tests specific to treatment regimen  The treatment plan start date, drug sequencing, and pre-medications Prior authorization status  Patient's documented medication list, including drug-drug interaction screen and prescriptions for anti-emetics and supportive care specific to the treatment regimen The drug concentrations, fluid compatibility, administration routes, and timing of the medications to be used The patient's access for treatment and lifetime cumulative dose history, if applicable  The patient's medication allergies and previous infusion related reactions, if applicable   Changes made to treatment plan:  treatment plan date  Follow up needed:  signing treatment plan   Adelina Mings, [place], 11/16/2020  1:35 PM

## 2020-11-19 ENCOUNTER — Other Ambulatory Visit: Payer: Self-pay | Admitting: *Deleted

## 2020-11-19 DIAGNOSIS — Z170 Estrogen receptor positive status [ER+]: Secondary | ICD-10-CM

## 2020-11-19 DIAGNOSIS — C50211 Malignant neoplasm of upper-inner quadrant of right female breast: Secondary | ICD-10-CM

## 2020-11-23 ENCOUNTER — Inpatient Hospital Stay: Payer: PRIVATE HEALTH INSURANCE

## 2020-11-23 ENCOUNTER — Inpatient Hospital Stay (HOSPITAL_BASED_OUTPATIENT_CLINIC_OR_DEPARTMENT_OTHER): Payer: PRIVATE HEALTH INSURANCE | Admitting: Internal Medicine

## 2020-11-23 ENCOUNTER — Encounter: Payer: Self-pay | Admitting: Internal Medicine

## 2020-11-23 ENCOUNTER — Inpatient Hospital Stay: Payer: PRIVATE HEALTH INSURANCE | Attending: Internal Medicine

## 2020-11-23 ENCOUNTER — Other Ambulatory Visit: Payer: Self-pay

## 2020-11-23 VITALS — BP 118/78 | HR 93 | Resp 18

## 2020-11-23 DIAGNOSIS — Z79899 Other long term (current) drug therapy: Secondary | ICD-10-CM | POA: Insufficient documentation

## 2020-11-23 DIAGNOSIS — Z5112 Encounter for antineoplastic immunotherapy: Secondary | ICD-10-CM | POA: Diagnosis present

## 2020-11-23 DIAGNOSIS — C50211 Malignant neoplasm of upper-inner quadrant of right female breast: Secondary | ICD-10-CM | POA: Insufficient documentation

## 2020-11-23 DIAGNOSIS — Z5111 Encounter for antineoplastic chemotherapy: Secondary | ICD-10-CM | POA: Insufficient documentation

## 2020-11-23 DIAGNOSIS — Z170 Estrogen receptor positive status [ER+]: Secondary | ICD-10-CM

## 2020-11-23 LAB — COMPREHENSIVE METABOLIC PANEL
ALT: 40 U/L (ref 0–44)
AST: 31 U/L (ref 15–41)
Albumin: 4.3 g/dL (ref 3.5–5.0)
Alkaline Phosphatase: 52 U/L (ref 38–126)
Anion gap: 11 (ref 5–15)
BUN: 11 mg/dL (ref 6–20)
CO2: 20 mmol/L — ABNORMAL LOW (ref 22–32)
Calcium: 8.9 mg/dL (ref 8.9–10.3)
Chloride: 105 mmol/L (ref 98–111)
Creatinine, Ser: 0.83 mg/dL (ref 0.44–1.00)
GFR, Estimated: >60 mL/min (ref >60)
Glucose, Bld: 111 mg/dL — ABNORMAL HIGH (ref 70–99)
Potassium: 3.8 mmol/L (ref 3.5–5.1)
Sodium: 136 mmol/L (ref 135–145)
Total Bilirubin: 0.4 mg/dL (ref 0.3–1.2)
Total Protein: 7.7 g/dL (ref 6.5–8.1)

## 2020-11-23 LAB — CBC WITH DIFFERENTIAL/PLATELET
Abs Immature Granulocytes: 0.01 10*3/uL (ref 0.00–0.07)
Basophils Absolute: 0.1 10*3/uL (ref 0.0–0.1)
Basophils Relative: 1 %
Eosinophils Absolute: 0.2 10*3/uL (ref 0.0–0.5)
Eosinophils Relative: 2 %
HCT: 41 % (ref 36.0–46.0)
Hemoglobin: 13.8 g/dL (ref 12.0–15.0)
Immature Granulocytes: 0 %
Lymphocytes Relative: 43 %
Lymphs Abs: 3.8 10*3/uL (ref 0.7–4.0)
MCH: 30.9 pg (ref 26.0–34.0)
MCHC: 33.7 g/dL (ref 30.0–36.0)
MCV: 91.7 fL (ref 80.0–100.0)
Monocytes Absolute: 0.6 10*3/uL (ref 0.1–1.0)
Monocytes Relative: 7 %
Neutro Abs: 4.1 10*3/uL (ref 1.7–7.7)
Neutrophils Relative %: 47 %
Platelets: 345 10*3/uL (ref 150–400)
RBC: 4.47 MIL/uL (ref 3.87–5.11)
RDW: 12.8 % (ref 11.5–15.5)
WBC: 8.9 10*3/uL (ref 4.0–10.5)
nRBC: 0 % (ref 0.0–0.2)

## 2020-11-23 LAB — PREGNANCY, URINE: Preg Test, Ur: NEGATIVE

## 2020-11-23 MED ORDER — ACETAMINOPHEN 325 MG PO TABS
650.0000 mg | ORAL_TABLET | Freq: Once | ORAL | Status: AC
  Administered 2020-11-23: 650 mg via ORAL
  Filled 2020-11-23: qty 2

## 2020-11-23 MED ORDER — SODIUM CHLORIDE 0.9 % IV SOLN
10.0000 mg | Freq: Once | INTRAVENOUS | Status: AC
  Administered 2020-11-23: 10 mg via INTRAVENOUS
  Filled 2020-11-23: qty 10

## 2020-11-23 MED ORDER — SODIUM CHLORIDE 0.9% FLUSH
10.0000 mL | Freq: Once | INTRAVENOUS | Status: AC
  Administered 2020-11-23: 10 mL via INTRAVENOUS
  Filled 2020-11-23: qty 10

## 2020-11-23 MED ORDER — SODIUM CHLORIDE 0.9 % IV SOLN
80.0000 mg/m2 | Freq: Once | INTRAVENOUS | Status: AC
  Administered 2020-11-23: 156 mg via INTRAVENOUS
  Filled 2020-11-23: qty 26

## 2020-11-23 MED ORDER — DIPHENHYDRAMINE HCL 50 MG/ML IJ SOLN
50.0000 mg | Freq: Once | INTRAMUSCULAR | Status: AC
  Administered 2020-11-23: 50 mg via INTRAVENOUS
  Filled 2020-11-23: qty 1

## 2020-11-23 MED ORDER — HEPARIN SOD (PORK) LOCK FLUSH 100 UNIT/ML IV SOLN
500.0000 [IU] | Freq: Once | INTRAVENOUS | Status: AC
  Administered 2020-11-23: 500 [IU] via INTRAVENOUS
  Filled 2020-11-23: qty 5

## 2020-11-23 MED ORDER — SODIUM CHLORIDE 0.9 % IV SOLN
Freq: Once | INTRAVENOUS | Status: AC
  Filled 2020-11-23: qty 250

## 2020-11-23 MED ORDER — TRASTUZUMAB-DKST CHEMO 150 MG IV SOLR
4.0000 mg/kg | Freq: Once | INTRAVENOUS | Status: AC
  Administered 2020-11-23: 336 mg via INTRAVENOUS
  Filled 2020-11-23: qty 16

## 2020-11-23 MED ORDER — FAMOTIDINE 20 MG IN NS 100 ML IVPB
20.0000 mg | Freq: Once | INTRAVENOUS | Status: AC
  Administered 2020-11-23: 20 mg via INTRAVENOUS
  Filled 2020-11-23: qty 20

## 2020-11-23 NOTE — Assessment & Plan Note (Addendum)
#  STAGE-I- Right breast-invasive mammary carcinoma- pT1bN0 [64mm]- ER/PR-Positive; Her-2 positive.Proceed with Taxol [name]-weekly x12 followed by [name] every 3 weeks for 12 months.OCT 2022-  MUGA scan- 58%.  # Proceed with Taxol-[name] # cycle #1 today. Labs today reviewed;  acceptable for treatment today.   Discussed the potential side effects including but not limited to-increasing fatigue, nausea vomiting, diarrhea, hair loss, sores in the mouth, increase risk of infection and also neuropathy.  Again reviewed the premedications including Tylenol/Benadryl with the patient.   # Headaches/Migraines-on Fioricet/amlotriptan; and also Emgalti SQ.-STABLE  # DISPOSITION: # chemo today.  # in 1 week- labs- cbc/cmp; Taxol-Herrceptin # follow up in 2 weeks- MD; labs- cbc/cmp; taxol-[name] weekly- Dr.B

## 2020-11-23 NOTE — Patient Instructions (Signed)
[facility]  Discharge Instructions: Thank you for choosing [facility] to provide your oncology and hematology care.  If you have a lab appointment with the [facility], please go directly to the [facility] and check in at the registration area.  Wear comfortable clothing and clothing appropriate for easy access to any Portacath or PICC line.   We strive to give you quality time with your provider. You may need to reschedule your appointment if you arrive late (15 or more minutes).  Arriving late affects you and other patients whose appointments are after yours.  Also, if you miss three or more appointments without notifying the office, you may be dismissed from the clinic at the provider's discretion.      For prescription refill requests, have your pharmacy contact our office and allow 72 hours for refills to be completed.    Today you received the following chemotherapy and/or immunotherapy agents OGIVIRI and TAXOL      To help prevent nausea and vomiting after your treatment, we encourage you to take your nausea medication as directed.  BELOW ARE SYMPTOMS THAT SHOULD BE REPORTED IMMEDIATELY: *FEVER GREATER THAN 100.4 F (38 C) OR HIGHER *CHILLS OR SWEATING *NAUSEA AND VOMITING THAT IS NOT CONTROLLED WITH YOUR NAUSEA MEDICATION *UNUSUAL SHORTNESS OF BREATH *UNUSUAL BRUISING OR BLEEDING *URINARY PROBLEMS (pain or burning when urinating, or frequent urination) *BOWEL PROBLEMS (unusual diarrhea, constipation, pain near the anus) TENDERNESS IN MOUTH AND THROAT WITH OR WITHOUT PRESENCE OF ULCERS (sore throat, sores in mouth, or a toothache) UNUSUAL RASH, SWELLING OR PAIN  UNUSUAL VAGINAL DISCHARGE OR ITCHING   Items with * indicate a potential emergency and should be followed up as soon as possible or go to the Emergency Department if any problems should occur.  Please show the CHEMOTHERAPY ALERT CARD or IMMUNOTHERAPY ALERT CARD at  check-in to the Emergency Department and triage nurse.  Should you have questions after your visit or need to cancel or reschedule your appointment, please contact [facility]  [phone] and follow the prompts.  Office hours are 8:00 a.m. to 4:30 p.m. Monday - Friday. Please note that voicemails left after 4:00 p.m. may not be returned until the following business day.  We are closed weekends and major holidays. You have access to a nurse at all times for urgent questions. Please call the main number to the clinic [phone] and follow the prompts.  For any non-urgent questions, you may also contact your provider using MyChart. We now offer e-Visits for anyone 64 and older to request care online for non-urgent symptoms. For details visit [email or web address].   Also download the MyChart app! Go to the app store, search "MyChart", open the app, select [facility], and log in with your MyChart username and password.  Due to Covid, a mask is required upon entering the hospital/clinic. If you do not have a mask, one will be given to you upon arrival. For doctor visits, patients may have 1 support person aged 43 or older with them. For treatment visits, patients cannot have anyone with them due to current Covid guidelines and our immunocompromised population.   Trastuzumab injection for infusion What is this medication? TRASTUZUMAB (tras TOO zoo mab) is a monoclonal antibody. It is used to treat breast cancer and stomach cancer. This medicine may be used for other purposes; ask your health care provider or pharmacist if you have questions. COMMON BRAND NAME(S): [name], [name], [name],  [name] What should I tell my care team before I take this medication? They need to know if you have any of these conditions: heart disease heart failure lung or breathing disease, like asthma an unusual or allergic reaction to trastuzumab, benzyl  alcohol, or other medications, foods, dyes, or preservatives pregnant or trying to get pregnant breast-feeding How should I use this medication? This drug is given as an infusion into a vein. It is administered in a hospital or clinic by a specially trained health care professional. Talk to your pediatrician regarding the use of this medicine in children. This medicine is not approved for use in children. Overdosage: If you think you have taken too much of this medicine contact a poison control center or emergency room at once. NOTE: This medicine is only for you. Do not share this medicine with others. What if I miss a dose? It is important not to miss a dose. Call your doctor or health care professional if you are unable to keep an appointment. What may interact with this medication? This medicine may interact with the following medications: certain types of chemotherapy, such as daunorubicin, doxorubicin, epirubicin, and idarubicin This list may not describe all possible interactions. Give your health care provider a list of all the medicines, herbs, non-prescription drugs, or dietary supplements you use. Also tell them if you smoke, drink alcohol, or use illegal drugs. Some items may interact with your medicine. What should I watch for while using this medication? Visit your doctor for checks on your progress. Report any side effects. Continue your course of treatment even though you feel ill unless your doctor tells you to stop. Call your doctor or health care professional for advice if you get a fever, chills or sore throat, or other symptoms of a cold or flu. Do not treat yourself. Try to avoid being around people who are sick. You may experience fever, chills and shaking during your first infusion. These effects are usually mild and can be treated with other medicines. Report any side effects during the infusion to your health care professional. Fever and chills usually do not happen with  later infusions. Do not become pregnant while taking this medicine or for 7 months after stopping it. Women should inform their doctor if they wish to become pregnant or think they might be pregnant. Women of child-bearing potential will need to have a negative pregnancy test before starting this medicine. There is a potential for serious side effects to an unborn child. Talk to your health care professional or pharmacist for more information. Do not breast-feed an infant while taking this medicine or for 7 months after stopping it. Women must use effective birth control with this medicine. What side effects may I notice from receiving this medication? Side effects that you should report to your doctor or health care professional as soon as possible: allergic reactions like skin rash, itching or hives, swelling of the face, lips, or tongue chest pain or palpitations cough dizziness feeling faint or lightheaded, falls fever general ill feeling or flu-like symptoms signs of worsening heart failure like breathing problems; swelling in your legs and feet unusually weak or tired Side effects that usually do not require medical attention (report to your doctor or health care professional if they continue or are bothersome): bone pain changes in taste diarrhea joint pain nausea/vomiting weight loss This list may not describe all possible side effects. Call your doctor for medical advice about side effects. You may report side  effects to FDA at 1-800-FDA-1088. Where should I keep my medication? This drug is given in a hospital or clinic and will not be stored at home. NOTE: This sheet is a summary. It may not cover all possible information. If you have questions about this medicine, talk to your doctor, pharmacist, or health care provider.  2022 Elsevier/Gold Standard (2016-01-22 00:00:00)  Paclitaxel injection What is this medication? PACLITAXEL (PAK li TAX el) is a chemotherapy drug. It  targets fast dividing cells, like cancer cells, and causes these cells to die. This medicine is used to treat ovarian cancer, breast cancer, lung cancer, Kaposi's sarcoma, and other cancers. This medicine may be used for other purposes; ask your health care provider or pharmacist if you have questions. COMMON BRAND NAME(S): Onxol, Taxol What should I tell my care team before I take this medication? They need to know if you have any of these conditions: history of irregular heartbeat liver disease low blood counts, like low white cell, platelet, or red cell counts lung or breathing disease, like asthma tingling of the fingers or toes, or other nerve disorder an unusual or allergic reaction to paclitaxel, alcohol, polyoxyethylated castor oil, other chemotherapy, other medicines, foods, dyes, or preservatives pregnant or trying to get pregnant breast-feeding How should I use this medication? This drug is given as an infusion into a vein. It is administered in a hospital or clinic by a specially trained health care professional. Talk to your pediatrician regarding the use of this medicine in children. Special care may be needed. Overdosage: If you think you have taken too much of this medicine contact a poison control center or emergency room at once. NOTE: This medicine is only for you. Do not share this medicine with others. What if I miss a dose? It is important not to miss your dose. Call your doctor or health care professional if you are unable to keep an appointment. What may interact with this medication? Do not take this medicine with any of the following medications: live virus vaccines This medicine may also interact with the following medications: antiviral medicines for hepatitis, HIV or AIDS certain antibiotics like erythromycin and clarithromycin certain medicines for fungal infections like ketoconazole and itraconazole certain medicines for seizures like carbamazepine,  phenobarbital, phenytoin gemfibrozil nefazodone rifampin St. John's wort This list may not describe all possible interactions. Give your health care provider a list of all the medicines, herbs, non-prescription drugs, or dietary supplements you use. Also tell them if you smoke, drink alcohol, or use illegal drugs. Some items may interact with your medicine. What should I watch for while using this medication? Your condition will be monitored carefully while you are receiving this medicine. You will need important blood work done while you are taking this medicine. This medicine can cause serious allergic reactions. To reduce your risk you will need to take other medicine(s) before treatment with this medicine. If you experience allergic reactions like skin rash, itching or hives, swelling of the face, lips, or tongue, tell your doctor or health care professional right away. In some cases, you may be given additional medicines to help with side effects. Follow all directions for their use. This drug may make you feel generally unwell. This is not uncommon, as chemotherapy can affect healthy cells as well as cancer cells. Report any side effects. Continue your course of treatment even though you feel ill unless your doctor tells you to stop. Call your doctor or health care professional for advice if  you get a fever, chills or sore throat, or other symptoms of a cold or flu. Do not treat yourself. This drug decreases your body's ability to fight infections. Try to avoid being around people who are sick. This medicine may increase your risk to bruise or bleed. Call your doctor or health care professional if you notice any unusual bleeding. Be careful brushing and flossing your teeth or using a toothpick because you may get an infection or bleed more easily. If you have any dental work done, tell your dentist you are receiving this medicine. Avoid taking products that contain aspirin, acetaminophen,  ibuprofen, naproxen, or ketoprofen unless instructed by your doctor. These medicines may hide a fever. Do not become pregnant while taking this medicine. Women should inform their doctor if they wish to become pregnant or think they might be pregnant. There is a potential for serious side effects to an unborn child. Talk to your health care professional or pharmacist for more information. Do not breast-feed an infant while taking this medicine. Men are advised not to father a child while receiving this medicine. This product may contain alcohol. Ask your pharmacist or healthcare provider if this medicine contains alcohol. Be sure to tell all healthcare providers you are taking this medicine. Certain medicines, like metronidazole and disulfiram, can cause an unpleasant reaction when taken with alcohol. The reaction includes flushing, headache, nausea, vomiting, sweating, and increased thirst. The reaction can last from 30 minutes to several hours. What side effects may I notice from receiving this medication? Side effects that you should report to your doctor or health care professional as soon as possible: allergic reactions like skin rash, itching or hives, swelling of the face, lips, or tongue breathing problems changes in vision fast, irregular heartbeat high or low blood pressure mouth sores pain, tingling, numbness in the hands or feet signs of decreased platelets or bleeding - bruising, pinpoint red spots on the skin, black, tarry stools, blood in the urine signs of decreased red blood cells - unusually weak or tired, feeling faint or lightheaded, falls signs of infection - fever or chills, cough, sore throat, pain or difficulty passing urine signs and symptoms of liver injury like dark yellow or brown urine; general ill feeling or flu-like symptoms; light-colored stools; loss of appetite; nausea; right upper belly pain; unusually weak or tired; yellowing of the eyes or skin swelling of the  ankles, feet, hands unusually slow heartbeat Side effects that usually do not require medical attention (report to your doctor or health care professional if they continue or are bothersome): diarrhea hair loss loss of appetite muscle or joint pain nausea, vomiting pain, redness, or irritation at site where injected tiredness This list may not describe all possible side effects. Call your doctor for medical advice about side effects. You may report side effects to FDA at 1-800-FDA-1088. Where should I keep my medication? This drug is given in a hospital or clinic and will not be stored at home. NOTE: This sheet is a summary. It may not cover all possible information. If you have questions about this medicine, talk to your doctor, pharmacist, or health care provider.  2022 Elsevier/Gold Standard (2020-09-25 00:00:00)

## 2020-11-23 NOTE — Progress Notes (Signed)
one Alexandra Warren NOTE  Patient Care Team: Alexandra Macadam, MD as PCP - General (Family Medicine)  CHIEF COMPLAINTS/PURPOSE OF CONSULTATION: Breast cancer  #  Oncology History Overview Note   #Right breast-invasive mammary carcinoma 2 o'clock position #1.1 cm-ER/PR HER2/neu positive [3+]; SEP, 2022-MRI-up to 3 cm non-mass enhancement noted.  #Left breast- MRI #upper outer quadrant- 95m #retroareolar 1cm-. NEGATIVE for malignancy   EProcedure: Lumpectomy  Specimen Laterality: Right  Histologic Type: Invasive ductal carcinoma  Histologic Grade:       Glandular (Acinar)/Tubular Differentiation: 3       Nuclear Pleomorphism: 2       Mitotic Rate: 1       Overall Grade: 2  Tumor Size: 0.9 cm  Ductal Carcinoma In Situ: Present, intermediate grade  Treatment Effect in the Breast: No known presurgical therapy  Margins: All margins negative for invasive carcinoma       Distance from Closest Margin (mm): Cannot be determined  DCIS Margins: Uninvolved by DCIS       Distance from Closest Margin (mm): Cannot be determined  Regional Lymph Nodes:       Number of Lymph Nodes Examined: 5       Number of Sentinel Nodes Examined: 5       Number of Lymph Nodes with Macrometastases (>2 mm): 0       Number of Lymph Nodes with Micrometastases: 0       Number of Lymph Nodes with Isolated Tumor Cells (=0.2 mm or =200  cells): 0       Size of Largest Metastatic Deposit (mm): Not applicable       Extranodal Extension: Not applicable  Distant Metastasis:       Distant Site(s) Involved: Not applicable  Breast Biomarker Testing Performed on Previous Biopsy:       Testing Performed on Case Number: [id number]             Estrogen Receptor: 95%, positive, strong staining intensity             Progesterone Receptor: 95%, positive, strong staining  intensity             HER2: Positive (3+)             Ki-67: 15%  Pathologic Stage Classification (pTNM, AJCC 8th Edition): pT1b, pN0   Representative Tumor Block: A4  Comment(s): None   # NOV 4th, 2022- TAXOL-[name] q W  ---------------------------------   # Headaches-Migraine/ Anxiety [[facility]; Dr.Yan  botox]  #Genetic testing-s/p genetic counseling-expanded panel negative.  #Reproductive counseling: DECLINED reproductive endocrinology.     Carcinoma of upper-inner quadrant of right breast in female, estrogen receptor positive [place]  09/21/2020 Initial Diagnosis   Malignant neoplasm of upper-inner quadrant of right female breast ([facility]   10/01/2020 Genetic Testing   Negative hereditary cancer genetic testing: no pathogenic variants detected in [name] and [name].  The report dates are October 01, 2020 and October 11, 2020, respectively.    The BRCAplus panel offered by [name] includes sequencing and deletion/duplication analysis for the following 8 genes: ATM, BRCA1, BRCA2, CDH1, CHEK2, PALB2, PTEN, and TP53.  The CustomNext-Cancer+RNAinsight panel offered by [employer] sequencing and rearrangement analysis for the following 47 genes:  APC, ATM, AXIN2, BARD1, BMPR1A, BRCA1, BRCA2, BRIP1, CDH1, CDK4, CDKN2A, CHEK2, DICER1, EPCAM, GREM1, HOXB13, MEN1, MLH1, MSH2, MSH3, MSH6, MUTYH, NBN, NF1, NF2, NTHL1, PALB2, PMS2, POLD1, POLE, PTEN, RAD51C, RAD51D, RECQL, RET, SDHA, SDHAF2,  SDHB, SDHC, SDHD, SMAD4, SMARCA4, STK11, TP53, TSC1, TSC2, and VHL.  RNA data is routinely analyzed for use in variant interpretation for all genes.   11/23/2020 -  Chemotherapy   Patient is on Treatment Plan : BREAST Paclitaxel + Trastuzumab q7d / Trastuzumab q21d        HISTORY OF PRESENTING ILLNESS: Ambulating independently; alone.  Alexandra Warren 40 y.o.  female patient with stage I ER/PR positive HER2 positive breast cancer is here to proceed with chemotherapy.  Patient is recovering well from surgery.  No fever no chills.  No further complaints of  dysuria.  Patient has been evaluated in the interim by neurology for fibromyalgia.  S/p Botox injections.  Review of Systems  Constitutional:  Negative for chills, diaphoresis, fever, malaise/fatigue and weight loss.  HENT:  Negative for nosebleeds and sore throat.   Eyes:  Negative for double vision.  Respiratory:  Negative for cough, hemoptysis, sputum production, shortness of breath and wheezing.   Cardiovascular:  Negative for chest pain, palpitations, orthopnea and leg swelling.  Gastrointestinal:  Negative for abdominal pain, blood in stool, constipation, diarrhea, heartburn, melena, nausea and vomiting.  Genitourinary:  Negative for dysuria, frequency and urgency.  Musculoskeletal:  Positive for myalgias. Negative for back pain and joint pain.  Skin: Negative.  Negative for itching and rash.  Neurological:  Positive for headaches. Negative for dizziness, tingling, focal weakness and weakness.  Endo/Heme/Allergies:  Does not bruise/bleed easily.  Psychiatric/Behavioral:  Negative for depression. The patient is nervous/anxious and has insomnia.   All other systems reviewed and are negative.   MEDICAL HISTORY:  Past Medical History:  Diagnosis Date  . Acid reflux   . Anxiety and depression    Dr. Gala Murdoch, MD  . Back pain   . Family history of breast cancer 09/21/2020  . Family history of malignant neoplasm of digestive organ 09/21/2020  . Fibromyalgia   . History of gallstones   . Malignant neoplasm of upper-inner quadrant of right female breast ([place]) 09/21/2020  . Migraine     SURGICAL HISTORY: Past Surgical History:  Procedure Laterality Date  . BREAST LUMPECTOMY WITH RADIOACTIVE SEED LOCALIZATION Right 10/17/2020   Procedure: RIGHT BREAST LUMPECTOMY WITH RADIOACTIVE SEED LOCALIZATION WITH RIGHT SENTINEL LYMPH NODE BIOPSY;  Surgeon: Alexandra Klein, MD;  Location: [facility];  Service: General;  Laterality: Right;  . CHOLECYSTECTOMY  12/2004  . PORTACATH PLACEMENT Left  10/17/2020   Procedure: INSERTION PORT-A-CATH;  Surgeon: Alexandra Klein, MD;  Location: [facility];  Service: General;  Laterality: Left;    SOCIAL HISTORY: Social History   Socioeconomic History  . Marital status: Divorced    Spouse name: Alexandra Warren  . Number of children: 0  . Years of education: MS  . Highest education level: Not on file  Occupational History  . Occupation: Optometrist  Tobacco Use  . Smoking status: Never  . Smokeless tobacco: Never  Vaping Use  . Vaping Use: Never used  Substance and Sexual Activity  . Alcohol use: Yes    Comment: pt states 1 glass of wine maybe once a month  . Drug use: No  . Sexual activity: Yes    Birth control/protection: Condom  Other Topics Concern  . Not on file  Social History Narrative   Accountant for commercial firm; wants short term disability;  never smoked; 2 drinks a month. Lives in [place] with boy friend [[place] No children. Has one brother    Social Determinants of Health  Financial Resource Strain: Not on file  Food Insecurity: Not on file  Transportation Needs: Not on file  Physical Activity: Not on file  Stress: Not on file  Social Connections: Not on file  Intimate Partner Violence: Not on file    FAMILY HISTORY: Family History  Problem Relation Age of Onset  . Healthy Mother   . Hypertension Father   . Skin cancer Brother        dx before 52  . Colon cancer Maternal Aunt 67  . Cervical cancer Maternal Aunt        dx before 107; x2  . Cancer Paternal Grandmother 99       unknown primary; ? liver  . Lung cancer Paternal Grandfather        dx after 16  . Breast cancer Cousin 23       maternal cousin  . Colon cancer Other        MGM's sister  . Gastric cancer Other        PGM's sister  . Leukemia Other        PMG's sister  . Rectal cancer Neg Hx   . Esophageal cancer Neg Hx     ALLERGIES:  is allergic to diphenhydramine hcl, promethazine, and  pseudoeph-doxylamine-dm-apap.  MEDICATIONS:  Current Outpatient Medications  Medication Sig Dispense Refill  . almotriptan (AXERT) 12.5 MG tablet Take 1 tab at onset of migraine.  May repeat in 2 hrs, if needed.  Max dose: 2 tabs/day. This is a 30 day prescription. 12 tablet 11  . azelastine (ASTELIN) 0.1 % nasal spray Place 1 spray into both nostrils daily as needed for rhinitis.    Marland Kitchen BOTOX 200 units SOLR Inject 200 Units into the skin See admin instructions. Every 90 days    . buPROPion (WELLBUTRIN XL) 300 MG 24 hr tablet Take 300 mg by mouth every morning.    . butalbital-acetaminophen-caffeine (FIORICET) 50-325-40 MG tablet Take 1 tablet by mouth every 8 (eight) hours as needed for headache. 10 tablet 5  . clonazePAM (KLONOPIN) 1 MG tablet Take 0.5-1 mg by mouth 2 (two) times daily as needed for anxiety.    . diazepam (VALIUM) 5 MG tablet One pill 45-60 mins prior to procedure; 1 pill 15 mins prior if needed. 6 tablet 0  . EMGALITY 120 MG/ML SOAJ Inject 120 mg into the skin every 30 (thirty) days. 3 mL 4  . lidocaine-prilocaine (EMLA) cream Apply 1 application topically as needed. 30 g 0  . omeprazole (PRILOSEC) 20 MG capsule Take 20 mg by mouth daily.    . ondansetron (ZOFRAN) 8 MG tablet Take 1 tablet (8 mg total) by mouth every 8 (eight) hours as needed for nausea or vomiting. 30 tablet 3  . OVER THE COUNTER MEDICATION Take 1 tablet by mouth daily. prevent a migraine    . oxyCODONE (OXY IR/ROXICODONE) 5 MG immediate release tablet Take 1 tablet (5 mg total) by mouth every 6 (six) hours as needed for severe pain. 10 tablet 0  . Rimegepant Sulfate (NURTEC) 75 MG TBDP Take 75 mg by mouth as needed (Take 1 at onset of headache, max is 75 mg in 24 hours). 8 tablet 11  . rosuvastatin (CRESTOR) 20 MG tablet Take 20 mg by mouth daily.    Marland Kitchen tiZANidine (ZANAFLEX) 4 MG tablet Take 1 tablet (4 mg total) by mouth every 8 (eight) hours as needed for muscle spasms (migraines). This is a 30 day rx. 20  tablet 5  .  topiramate (TOPAMAX) 100 MG tablet Take 1 tablet (100 mg total) by mouth 2 (two) times daily. (Patient taking differently: Take 200 mg by mouth at bedtime.) 60 tablet 11  . traMADol (ULTRAM) 50 MG tablet Take 50 mg by mouth every 6 (six) hours as needed for pain.    Marland Kitchen vortioxetine HBr (TRINTELLIX) 20 MG TABS tablet Take 20 mg by mouth daily.  0  . zolpidem (AMBIEN) 10 MG tablet Take 10 mg by mouth at bedtime as needed for sleep.     No current facility-administered medications for this visit.   Facility-Administered Medications Ordered in Other Visits  Medication Dose Route Frequency Provider Last Rate Last Admin  . dexamethasone (DECADRON) 10 mg in sodium chloride 0.9 % 50 mL IVPB  10 mg Intravenous Once Cammie Sickle, MD 204 mL/hr at 11/23/20 0929 10 mg at 11/23/20 0929  . famotidine (PEPCID) IVPB 20 mg in NS 100 mL IVPB  20 mg Intravenous Once Charlaine Dalton R, MD 400 mL/hr at 11/23/20 0930 20 mg at 11/23/20 0930  . heparin lock flush 100 unit/mL  500 Units Intravenous Once Charlaine Dalton R, MD      . PACLitaxel (TAXOL) 156 mg in sodium chloride 0.9 % 250 mL chemo infusion (</= 26m/m2)  80 mg/m2 (Treatment Plan Recorded) Intravenous Once BCammie Sickle MD      . trastuzumab-dkst ([name]) 336 mg in sodium chloride 0.9 % 250 mL chemo infusion  4 mg/kg (Treatment Plan Recorded) Intravenous Once BCammie Sickle MD          .  PHYSICAL EXAMINATION: ECOG PERFORMANCE STATUS: 0 - Asymptomatic  Vitals:   11/23/20 0842  BP: 124/88  Pulse: 93  Resp: 16  Temp: 98.3 F (36.8 C)  SpO2: 99%   Filed Weights   11/23/20 0842  Weight: 178 lb 3.2 oz (80.8 kg)    Physical Exam Vitals and nursing note reviewed.  HENT:     Head: Normocephalic and atraumatic.     Mouth/Throat:     Pharynx: Oropharynx is clear.  Eyes:     Extraocular Movements: Extraocular movements intact.     Pupils: Pupils are equal, round, and reactive to light.   Cardiovascular:     Rate and Rhythm: Normal rate and regular rhythm.  Abdominal:     Palpations: Abdomen is soft.  Musculoskeletal:        General: Normal range of motion.     Cervical back: Normal range of motion.  Skin:    General: Skin is warm.  Neurological:     General: No focal deficit present.     Mental Status: She is alert and oriented to person, place, and time.  Psychiatric:        Behavior: Behavior normal.        Judgment: Judgment normal.     LABORATORY DATA:  I have reviewed the data as listed Lab Results  Component Value Date   WBC 8.9 11/23/2020   HGB 13.8 11/23/2020   HCT 41.0 11/23/2020   MCV 91.7 11/23/2020   PLT 345 11/23/2020   Recent Labs    01/24/20 1620 10/10/20 1435 11/23/20 0824  NA 136 136 136  K 3.6 3.7 3.8  CL 105 105 105  CO2 19* 23 20*  GLUCOSE 142* 107* 111*  BUN 8 9 11   CREATININE 0.71 0.86 0.83  CALCIUM 8.7* 9.2 8.9  GFRNONAA >60 >60 >60  PROT  --   --  7.7  ALBUMIN  --   --  4.3  AST  --   --  31  ALT  --   --  40  ALKPHOS  --   --  52  BILITOT  --   --  0.4    RADIOGRAPHIC STUDIES: I have personally reviewed the radiological images as listed and agreed with the findings in the report. NM Cardiac Muga Rest  Result Date: 10/29/2020 CLINICAL DATA:  Chemotherapy patient.  Assess LV function. EXAM: NUCLEAR MEDICINE CARDIAC BLOOD POOL IMAGING (MUGA) TECHNIQUE: Cardiac multi-gated acquisition was performed at rest following intravenous injection of Tc-81mlabeled red blood cells. RADIOPHARMACEUTICALS:  22.4 mCi Tc-971mertechnetate in-vitro labeled red blood cells IV COMPARISON:  None. FINDINGS: No wall motion abnormality identified. The left ventricular ejection fraction was calculated at 58%. IMPRESSION: Baseline left ventricular ejection fraction is equal to 58%. Electronically Signed   By: TaKerby Moors.D.   On: 10/29/2020 14:35    ASSESSMENT & PLAN:   Carcinoma of upper-inner quadrant of right breast in female,  estrogen receptor positive ([place]# STAGE-I- Right breast-invasive mammary carcinoma- pT1bN0 [40m66m ER/PR-Positive; Her-2 positive.Proceed with Taxol [name]-weekly x12 followed by [name] every 3 weeks for 12 months.OCT 2022-  MUGA scan- 58%.  # Proceed with Taxol-[name] # cycle #1 today. Labs today reviewed;  acceptable for treatment today.   Discussed the potential side effects including but not limited to-increasing fatigue, nausea vomiting, diarrhea, hair loss, sores in the mouth, increase risk of infection and also neuropathy.  Again reviewed the premedications including Tylenol/Benadryl with the patient.   # Headaches/Migraines-on Fioricet/amlotriptan; and also Emgalti SQ.-STABLE  # DISPOSITION: # chemo today.  # in 1 week- labs- cbc/cmp; Taxol-Herrceptin # follow up in 2 weeks- MD; labs- cbc/cmp; taxol-[name] weekly- Dr.B   All questions were answered. The patient/family knows to call the clinic with any problems, questions or concerns.     GovCammie SickleD 11/23/2020 9:35 AM

## 2020-11-23 NOTE — Progress Notes (Signed)
Pt has no concerns or complaints at this time.

## 2020-11-27 ENCOUNTER — Telehealth: Payer: Self-pay

## 2020-11-27 NOTE — Telephone Encounter (Signed)
Telephone call to patient for follow up after receiving first infusion.   No answer but left message stating we were calling to check on them.  Encouraged patient to call for any questions or concerns.   

## 2020-11-30 ENCOUNTER — Encounter: Payer: Self-pay | Admitting: Internal Medicine

## 2020-11-30 ENCOUNTER — Inpatient Hospital Stay (HOSPITAL_BASED_OUTPATIENT_CLINIC_OR_DEPARTMENT_OTHER): Payer: PRIVATE HEALTH INSURANCE | Admitting: Internal Medicine

## 2020-11-30 ENCOUNTER — Inpatient Hospital Stay: Payer: PRIVATE HEALTH INSURANCE

## 2020-11-30 ENCOUNTER — Other Ambulatory Visit: Payer: Self-pay

## 2020-11-30 DIAGNOSIS — C50211 Malignant neoplasm of upper-inner quadrant of right female breast: Secondary | ICD-10-CM

## 2020-11-30 DIAGNOSIS — Z170 Estrogen receptor positive status [ER+]: Secondary | ICD-10-CM | POA: Diagnosis not present

## 2020-11-30 DIAGNOSIS — Z5111 Encounter for antineoplastic chemotherapy: Secondary | ICD-10-CM | POA: Diagnosis not present

## 2020-11-30 LAB — CBC WITH DIFFERENTIAL/PLATELET
Abs Immature Granulocytes: 0.02 10*3/uL (ref 0.00–0.07)
Basophils Absolute: 0.1 10*3/uL (ref 0.0–0.1)
Basophils Relative: 1 %
Eosinophils Absolute: 0.3 10*3/uL (ref 0.0–0.5)
Eosinophils Relative: 5 %
HCT: 36.2 % (ref 36.0–46.0)
Hemoglobin: 12.3 g/dL (ref 12.0–15.0)
Immature Granulocytes: 0 %
Lymphocytes Relative: 50 %
Lymphs Abs: 3.3 10*3/uL (ref 0.7–4.0)
MCH: 31.1 pg (ref 26.0–34.0)
MCHC: 34 g/dL (ref 30.0–36.0)
MCV: 91.4 fL (ref 80.0–100.0)
Monocytes Absolute: 0.4 10*3/uL (ref 0.1–1.0)
Monocytes Relative: 5 %
Neutro Abs: 2.6 10*3/uL (ref 1.7–7.7)
Neutrophils Relative %: 39 %
Platelets: 325 10*3/uL (ref 150–400)
RBC: 3.96 MIL/uL (ref 3.87–5.11)
RDW: 12.4 % (ref 11.5–15.5)
WBC: 6.7 10*3/uL (ref 4.0–10.5)
nRBC: 0 % (ref 0.0–0.2)

## 2020-11-30 LAB — COMPREHENSIVE METABOLIC PANEL
ALT: 51 U/L — ABNORMAL HIGH (ref 0–44)
AST: 34 U/L (ref 15–41)
Albumin: 3.9 g/dL (ref 3.5–5.0)
Alkaline Phosphatase: 56 U/L (ref 38–126)
Anion gap: 7 (ref 5–15)
BUN: 9 mg/dL (ref 6–20)
CO2: 23 mmol/L (ref 22–32)
Calcium: 8.8 mg/dL — ABNORMAL LOW (ref 8.9–10.3)
Chloride: 110 mmol/L (ref 98–111)
Creatinine, Ser: 0.77 mg/dL (ref 0.44–1.00)
GFR, Estimated: >60 mL/min (ref >60)
Glucose, Bld: 112 mg/dL — ABNORMAL HIGH (ref 70–99)
Potassium: 3.8 mmol/L (ref 3.5–5.1)
Sodium: 140 mmol/L (ref 135–145)
Total Bilirubin: 0.3 mg/dL (ref 0.3–1.2)
Total Protein: 6.6 g/dL (ref 6.5–8.1)

## 2020-11-30 LAB — PREGNANCY, URINE: Preg Test, Ur: NEGATIVE

## 2020-11-30 MED ORDER — ACETAMINOPHEN 325 MG PO TABS
650.0000 mg | ORAL_TABLET | Freq: Once | ORAL | Status: AC
  Administered 2020-11-30: 650 mg via ORAL
  Filled 2020-11-30: qty 2

## 2020-11-30 MED ORDER — DIPHENHYDRAMINE HCL 50 MG/ML IJ SOLN
50.0000 mg | Freq: Once | INTRAMUSCULAR | Status: AC
  Administered 2020-11-30: 50 mg via INTRAVENOUS
  Filled 2020-11-30: qty 1

## 2020-11-30 MED ORDER — HEPARIN SOD (PORK) LOCK FLUSH 100 UNIT/ML IV SOLN
500.0000 [IU] | Freq: Once | INTRAVENOUS | Status: AC | PRN
  Filled 2020-11-30: qty 5

## 2020-11-30 MED ORDER — SODIUM CHLORIDE 0.9 % IV SOLN
10.0000 mg | Freq: Once | INTRAVENOUS | Status: AC
  Administered 2020-11-30: 10 mg via INTRAVENOUS
  Filled 2020-11-30: qty 10

## 2020-11-30 MED ORDER — HEPARIN SOD (PORK) LOCK FLUSH 100 UNIT/ML IV SOLN
INTRAVENOUS | Status: AC
  Administered 2020-11-30: 500 [IU]
  Filled 2020-11-30: qty 5

## 2020-11-30 MED ORDER — FAMOTIDINE 20 MG IN NS 100 ML IVPB
20.0000 mg | Freq: Once | INTRAVENOUS | Status: AC
  Administered 2020-11-30: 20 mg via INTRAVENOUS
  Filled 2020-11-30: qty 20

## 2020-11-30 MED ORDER — SODIUM CHLORIDE 0.9 % IV SOLN
Freq: Once | INTRAVENOUS | Status: AC
  Filled 2020-11-30: qty 250

## 2020-11-30 MED ORDER — TRASTUZUMAB-DKST CHEMO 150 MG IV SOLR
2.0000 mg/kg | Freq: Once | INTRAVENOUS | Status: AC
  Administered 2020-11-30: 168 mg via INTRAVENOUS
  Filled 2020-11-30: qty 8

## 2020-11-30 MED ORDER — SODIUM CHLORIDE 0.9 % IV SOLN
80.0000 mg/m2 | Freq: Once | INTRAVENOUS | Status: AC
  Administered 2020-11-30: 156 mg via INTRAVENOUS
  Filled 2020-11-30: qty 26

## 2020-11-30 NOTE — Assessment & Plan Note (Addendum)
#  STAGE-I- Right breast-invasive mammary carcinoma- pT1bN0 [51m]- ER/PR-Positive; Her-2 positive.Proceed with Taxol [name]-weekly x12 followed by [name] every 3 weeks for 12 months.OCT 2022-  MUGA scan- 58%.  # Proceed with weekly Taxol-[name] #2 today. Labs today reviewed;  acceptable for treatment today.   # Nausea- no vomiting-improved with antiemetics.  # Diarrhea- G-1-2; immodium- STABLE.  # Headaches/Migraines-on Fioricet/amlotriptan; and also Emgalti SQ.-STABLE  # DISPOSITION: # chemo today.  # in 1 week- labs- cbc/cmp; Taxol-Herrceptin # follow up in dec 1st or dec 2nd- MD; labs- cbc/cmp; taxol-[name] weekly- Dr.B

## 2020-11-30 NOTE — Patient Instructions (Signed)
[facility]   Discharge Instructions: Thank you for choosing [facility] to provide your oncology and hematology care.  If you have a lab appointment with the [facility], please go directly to the [facility] and check in at the registration area.  Wear comfortable clothing and clothing appropriate for easy access to any Portacath or PICC line.   We strive to give you quality time with your provider. You may need to reschedule your appointment if you arrive late (15 or more minutes).  Arriving late affects you and other patients whose appointments are after yours.  Also, if you miss three or more appointments without notifying the office, you may be dismissed from the clinic at the provider's discretion.      For prescription refill requests, have your pharmacy contact our office and allow 72 hours for refills to be completed.    Today you received the following chemotherapy and/or immunotherapy agents: [name] and Taxol.      To help prevent nausea and vomiting after your treatment, we encourage you to take your nausea medication as directed.  BELOW ARE SYMPTOMS THAT SHOULD BE REPORTED IMMEDIATELY: *FEVER GREATER THAN 100.4 F (38 C) OR HIGHER *CHILLS OR SWEATING *NAUSEA AND VOMITING THAT IS NOT CONTROLLED WITH YOUR NAUSEA MEDICATION *UNUSUAL SHORTNESS OF BREATH *UNUSUAL BRUISING OR BLEEDING *URINARY PROBLEMS (pain or burning when urinating, or frequent urination) *BOWEL PROBLEMS (unusual diarrhea, constipation, pain near the anus) TENDERNESS IN MOUTH AND THROAT WITH OR WITHOUT PRESENCE OF ULCERS (sore throat, sores in mouth, or a toothache) UNUSUAL RASH, SWELLING OR PAIN  UNUSUAL VAGINAL DISCHARGE OR ITCHING   Items with * indicate a potential emergency and should be followed up as soon as possible or go to the Emergency Department if any problems should occur.  Please show the CHEMOTHERAPY ALERT CARD or IMMUNOTHERAPY ALERT CARD at  check-in to the Emergency Department and triage nurse.  Should you have questions after your visit or need to cancel or reschedule your appointment, please contact [facility]  [phone] and follow the prompts.  Office hours are 8:00 a.m. to 4:30 p.m. Monday - Friday. Please note that voicemails left after 4:00 p.m. may not be returned until the following business day.  We are closed weekends and major holidays. You have access to a nurse at all times for urgent questions. Please call the main number to the clinic [phone] and follow the prompts.  For any non-urgent questions, you may also contact your provider using MyChart. We now offer e-Visits for anyone 66 and older to request care online for non-urgent symptoms. For details visit [email or web address].   Also download the MyChart app! Go to the app store, search "MyChart", open the app, select [facility], and log in with your MyChart username and password.  Due to Covid, a mask is required upon entering the hospital/clinic. If you do not have a mask, one will be given to you upon arrival. For doctor visits, patients may have 1 support person aged 87 or older with them. For treatment visits, patients cannot have anyone with them due to current Covid guidelines and our immunocompromised population.

## 2020-11-30 NOTE — Progress Notes (Signed)
one [place] NOTE  Patient Care Team: [name], MD as PCP - General (Family Medicine)  CHIEF COMPLAINTS/PURPOSE OF CONSULTATION: Breast cancer  #  Oncology History Overview Note   #Right breast-invasive mammary carcinoma 2 o'clock position #1.1 cm-ER/PR HER2/neu positive [3+]; SEP, 2022-MRI-up to 3 cm non-mass enhancement noted.  #Left breast- MRI #upper outer quadrant- 86m #retroareolar 1cm-. NEGATIVE for malignancy   EProcedure: Lumpectomy  Specimen Laterality: Right  Histologic Type: Invasive ductal carcinoma  Histologic Grade:       Glandular (Acinar)/Tubular Differentiation: 3       Nuclear Pleomorphism: 2       Mitotic Rate: 1       Overall Grade: 2  Tumor Size: 0.9 cm  Ductal Carcinoma In Situ: Present, intermediate grade  Treatment Effect in the Breast: No known presurgical therapy  Margins: All margins negative for invasive carcinoma       Distance from Closest Margin (mm): Cannot be determined  DCIS Margins: Uninvolved by DCIS       Distance from Closest Margin (mm): Cannot be determined  Regional Lymph Nodes:       Number of Lymph Nodes Examined: 5       Number of Sentinel Nodes Examined: 5       Number of Lymph Nodes with Macrometastases (>2 mm): 0       Number of Lymph Nodes with Micrometastases: 0       Number of Lymph Nodes with Isolated Tumor Cells (=0.2 mm or =200  cells): 0       Size of Largest Metastatic Deposit (mm): Not applicable       Extranodal Extension: Not applicable  Distant Metastasis:       Distant Site(s) Involved: Not applicable  Breast Biomarker Testing Performed on Previous Biopsy:       Testing Performed on Case Number: [id number]             Estrogen Receptor: 95%, positive, strong staining intensity             Progesterone Receptor: 95%, positive, strong staining  intensity             HER2: Positive (3+)             Ki-67: 15%  Pathologic Stage Classification (pTNM, AJCC 8th Edition): pT1b, pN0   Representative Tumor Block: A4  Comment(s): None   # NOV 4th, 2022- TAXOL-[name] q W  ---------------------------------   # Headaches-Migraine/ Anxiety [[name]; Dr.Yan  botox]  #Genetic testing-s/p genetic counseling-expanded panel negative.  #Reproductive counseling: DECLINED reproductive endocrinology.     Carcinoma of upper-inner quadrant of right breast in female, estrogen receptor positive ([facility]  09/21/2020 Initial Diagnosis   Malignant neoplasm of upper-inner quadrant of right female breast ([facility]   10/01/2020 Genetic Testing   Negative hereditary cancer genetic testing: no pathogenic variants detected in [name] and [name].  The report dates are October 01, 2020 and October 11, 2020, respectively.    The BRCAplus panel offered by [name] includes sequencing and deletion/duplication analysis for the following 8 genes: ATM, BRCA1, BRCA2, CDH1, CHEK2, PALB2, PTEN, and TP53.  The CustomNext-Cancer+RNAinsight panel offered by [employer] sequencing and rearrangement analysis for the following 47 genes:  APC, ATM, AXIN2, BARD1, BMPR1A, BRCA1, BRCA2, BRIP1, CDH1, CDK4, CDKN2A, CHEK2, DICER1, EPCAM, GREM1, HOXB13, MEN1, MLH1, MSH2, MSH3, MSH6, MUTYH, NBN, NF1, NF2, NTHL1, PALB2, PMS2, POLD1, POLE, PTEN, RAD51C, RAD51D, RECQL, RET, SDHA, SDHAF2,  SDHB, SDHC, SDHD, SMAD4, SMARCA4, STK11, TP53, TSC1, TSC2, and VHL.  RNA data is routinely analyzed for use in variant interpretation for all genes.   11/23/2020 -  Chemotherapy   Patient is on Treatment Plan : BREAST Paclitaxel + Trastuzumab q7d / Trastuzumab q21d        HISTORY OF PRESENTING ILLNESS: Ambulating independently; accompanied by family. Alexandra Warren 40 y.o.  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  Admits to mild to moderate nausea without any vomiting.  Improved with  antiemetics.  Denies any tingling or numbness.  Admits to mild diarrhea post Taxol.  Improved with Imodium.  None currently.   Review of Systems  Constitutional:  Negative for chills, diaphoresis, fever, malaise/fatigue and weight loss.  HENT:  Negative for nosebleeds and sore throat.   Eyes:  Negative for double vision.  Respiratory:  Negative for cough, hemoptysis, sputum production, shortness of breath and wheezing.   Cardiovascular:  Negative for chest pain, palpitations, orthopnea and leg swelling.  Gastrointestinal:  Negative for abdominal pain, blood in stool, constipation, diarrhea, heartburn, melena, nausea and vomiting.  Genitourinary:  Negative for dysuria, frequency and urgency.  Musculoskeletal:  Positive for myalgias. Negative for back pain and joint pain.  Skin: Negative.  Negative for itching and rash.  Neurological:  Positive for headaches. Negative for dizziness, tingling, focal weakness and weakness.  Endo/Heme/Allergies:  Does not bruise/bleed easily.  Psychiatric/Behavioral:  Negative for depression. The patient is nervous/anxious and has insomnia.   All other systems reviewed and are negative.   MEDICAL HISTORY:  Past Medical History:  Diagnosis Date   Acid reflux    Anxiety and depression    Dr. Gala Murdoch, MD   Back pain    Family history of breast cancer 09/21/2020   Family history of malignant neoplasm of digestive organ 09/21/2020   Fibromyalgia    History of gallstones    Malignant neoplasm of upper-inner quadrant of right female breast ([place]) 09/21/2020   Migraine     SURGICAL HISTORY: Past Surgical History:  Procedure Laterality Date   BREAST LUMPECTOMY WITH RADIOACTIVE SEED LOCALIZATION Right 10/17/2020   Procedure: RIGHT BREAST LUMPECTOMY WITH RADIOACTIVE SEED LOCALIZATION WITH RIGHT SENTINEL LYMPH NODE BIOPSY;  Surgeon: Alexandra Klein, MD;  Location: [facility];  Service: General;  Laterality: Right;   CHOLECYSTECTOMY  12/2004   PORTACATH PLACEMENT Left  10/17/2020   Procedure: INSERTION PORT-A-CATH;  Surgeon: Alexandra Klein, MD;  Location: [facility];  Service: General;  Laterality: Left;    SOCIAL HISTORY: Social History   Socioeconomic History   Marital status: Divorced    Spouse name: Alexandra Cornea   Number of children: 0   Years of education: MS   Highest education level: Not on file  Occupational History   Occupation: Optometrist  Tobacco Use   Smoking status: Never   Smokeless tobacco: Never  Vaping Use   Vaping Use: Never used  Substance and Sexual Activity   Alcohol use: Yes    Comment: pt states 1 glass of wine maybe once a month   Drug use: No   Sexual activity: Yes    Birth control/protection: Condom  Other Topics Concern   Not on file  Social History Narrative   Accountant for commercial firm; wants short term disability;  never smoked; 2 drinks a month. Lives in [place] with boy friend [[place] No children. Has one brother    Social  Determinants of Health   Financial Resource Strain: Not on file  Food Insecurity: Not on file  Transportation Needs: Not on file  Physical Activity: Not on file  Stress: Not on file  Social Connections: Not on file  Intimate Partner Violence: Not on file    FAMILY HISTORY: Family History  Problem Relation Age of Onset   Healthy Mother    Hypertension Father    Skin cancer Brother        dx before 49   Colon cancer Maternal Aunt 50   Cervical cancer Maternal Aunt        dx before 56; x2   Cancer Paternal Grandmother 99       unknown primary; ? liver   Lung cancer Paternal Grandfather        dx after 75   Breast cancer Cousin 49       maternal cousin   Colon cancer Other        MGM's sister   Gastric cancer Other        PGM's sister   Leukemia Other        PMG's sister   Rectal cancer Neg Hx    Esophageal cancer Neg Hx     ALLERGIES:  is allergic to antihistamines, diphenhydramine-type; diphenhydramine hcl; promethazine; and  pseudoeph-doxylamine-dm-apap.  MEDICATIONS:  Current Outpatient Medications  Medication Sig Dispense Refill   almotriptan (AXERT) 12.5 MG tablet Take 1 tab at onset of migraine.  May repeat in 2 hrs, if needed.  Max dose: 2 tabs/day. This is a 30 day prescription. 12 tablet 11   azelastine (ASTELIN) 0.1 % nasal spray Place 1 spray into both nostrils daily as needed for rhinitis.     BOTOX 200 units SOLR Inject 200 Units into the skin See admin instructions. Every 90 days     buPROPion (WELLBUTRIN XL) 300 MG 24 hr tablet Take 300 mg by mouth every morning.     butalbital-acetaminophen-caffeine (FIORICET) 50-325-40 MG tablet Take 1 tablet by mouth every 8 (eight) hours as needed for headache. 10 tablet 5   clonazePAM (KLONOPIN) 1 MG tablet Take 0.5-1 mg by mouth 2 (two) times daily as needed for anxiety.     diazepam (VALIUM) 5 MG tablet One pill 45-60 mins prior to procedure; 1 pill 15 mins prior if needed. 6 tablet 0   EMGALITY 120 MG/ML SOAJ Inject 120 mg into the skin every 30 (thirty) days. 3 mL 4   lidocaine-prilocaine (EMLA) cream Apply 1 application topically as needed. 30 g 0   omeprazole (PRILOSEC) 20 MG capsule Take 20 mg by mouth daily.     ondansetron (ZOFRAN) 8 MG tablet Take 1 tablet (8 mg total) by mouth every 8 (eight) hours as needed for nausea or vomiting. 30 tablet 3   OVER THE COUNTER MEDICATION Take 1 tablet by mouth daily. prevent a migraine     oxyCODONE (OXY IR/ROXICODONE) 5 MG immediate release tablet Take 1 tablet (5 mg total) by mouth every 6 (six) hours as needed for severe pain. 10 tablet 0   Rimegepant Sulfate (NURTEC) 75 MG TBDP Take 75 mg by mouth as needed (Take 1 at onset of headache, max is 75 mg in 24 hours). 8 tablet 11   rosuvastatin (CRESTOR) 20 MG tablet Take 20 mg by mouth daily.     tiZANidine (ZANAFLEX) 4 MG tablet Take 1 tablet (4 mg total) by mouth every 8 (eight) hours as needed for muscle spasms (migraines). This is a 34  day rx. 20 tablet 5    topiramate (TOPAMAX) 100 MG tablet Take 1 tablet (100 mg total) by mouth 2 (two) times daily. (Patient taking differently: Take 200 mg by mouth at bedtime.) 60 tablet 11   traMADol (ULTRAM) 50 MG tablet Take 50 mg by mouth every 6 (six) hours as needed for pain.     vortioxetine HBr (TRINTELLIX) 20 MG TABS tablet Take 20 mg by mouth daily.  0   zolpidem (AMBIEN) 10 MG tablet Take 10 mg by mouth at bedtime as needed for sleep.     No current facility-administered medications for this visit.      Marland Kitchen  PHYSICAL EXAMINATION: ECOG PERFORMANCE STATUS: 0 - Asymptomatic  Vitals:   11/30/20 0902  BP: 117/79  Pulse: 85  Resp: 18  Temp: 97.8 F (36.6 Warren)  SpO2: 97%    Filed Weights   11/30/20 0902  Weight: 180 lb 6.4 oz (81.8 kg)     Physical Exam Vitals and nursing note reviewed.  HENT:     Head: Normocephalic and atraumatic.     Mouth/Throat:     Pharynx: Oropharynx is clear.  Eyes:     Extraocular Movements: Extraocular movements intact.     Pupils: Pupils are equal, round, and reactive to light.  Cardiovascular:     Rate and Rhythm: Normal rate and regular rhythm.  Abdominal:     Palpations: Abdomen is soft.  Musculoskeletal:        General: Normal range of motion.     Cervical back: Normal range of motion.  Skin:    General: Skin is warm.  Neurological:     General: No focal deficit present.     Mental Status: She is alert and oriented to person, place, and time.  Psychiatric:        Behavior: Behavior normal.        Judgment: Judgment normal.     LABORATORY DATA:  I have reviewed the data as listed Lab Results  Component Value Date   WBC 6.7 11/30/2020   HGB 12.3 11/30/2020   HCT 36.2 11/30/2020   MCV 91.4 11/30/2020   PLT 325 11/30/2020   Recent Labs    10/10/20 1435 11/23/20 0824 11/30/20 0838  NA 136 136 140  K 3.7 3.8 3.8  CL 105 105 110  CO2 23 20* 23  GLUCOSE 107* 111* 112*  BUN 9 11 9   CREATININE 0.86 0.83 0.77  CALCIUM 9.2 8.9 8.8*   GFRNONAA >60 >60 >60  PROT  --  7.7 6.6  ALBUMIN  --  4.3 3.9  AST  --  31 34  ALT  --  40 51*  ALKPHOS  --  52 56  BILITOT  --  0.4 0.3    RADIOGRAPHIC STUDIES: I have personally reviewed the radiological images as listed and agreed with the findings in the report. No results found.  ASSESSMENT & PLAN:   Carcinoma of upper-inner quadrant of right breast in female, estrogen receptor positive ([place]) # STAGE-I- Right breast-invasive mammary carcinoma- pT1bN0 [34m]- ER/PR-Positive; Her-2 positive.Proceed with Taxol [name]-weekly x12 followed by [name] every 3 weeks for 12 months.OCT 2022-  MUGA scan- 58%.  # Proceed with weekly Taxol-[name] #2 today. Labs today reviewed;  acceptable for treatment today.   # Nausea- no vomiting-improved with antiemetics.  # Diarrhea- G-1-2; immodium- STABLE.  # Headaches/Migraines-on Fioricet/amlotriptan; and also Emgalti SQ.-STABLE  # DISPOSITION: # chemo today.  # in 1 week- labs- cbc/cmp; Taxol-Herrceptin # follow up  in dec 1st or dec 2nd- MD; labs- cbc/cmp; taxol-[name] weekly- Dr.B   All questions were answered. The patient/family knows to call the clinic with any problems, questions or concerns.     Cammie Sickle, MD 11/30/2020 10:34 PM

## 2020-12-03 ENCOUNTER — Encounter: Payer: Self-pay | Admitting: Internal Medicine

## 2020-12-04 ENCOUNTER — Ambulatory Visit: Payer: PRIVATE HEALTH INSURANCE | Admitting: Neurology

## 2020-12-05 ENCOUNTER — Encounter: Payer: Self-pay | Admitting: Internal Medicine

## 2020-12-07 ENCOUNTER — Encounter: Payer: Self-pay | Admitting: Internal Medicine

## 2020-12-07 ENCOUNTER — Other Ambulatory Visit: Payer: Self-pay | Admitting: Internal Medicine

## 2020-12-07 ENCOUNTER — Inpatient Hospital Stay: Payer: PRIVATE HEALTH INSURANCE

## 2020-12-07 ENCOUNTER — Other Ambulatory Visit: Payer: Self-pay

## 2020-12-07 ENCOUNTER — Inpatient Hospital Stay (HOSPITAL_BASED_OUTPATIENT_CLINIC_OR_DEPARTMENT_OTHER): Payer: PRIVATE HEALTH INSURANCE | Admitting: Internal Medicine

## 2020-12-07 DIAGNOSIS — Z170 Estrogen receptor positive status [ER+]: Secondary | ICD-10-CM | POA: Diagnosis not present

## 2020-12-07 DIAGNOSIS — C50211 Malignant neoplasm of upper-inner quadrant of right female breast: Secondary | ICD-10-CM

## 2020-12-07 DIAGNOSIS — Z5111 Encounter for antineoplastic chemotherapy: Secondary | ICD-10-CM | POA: Diagnosis not present

## 2020-12-07 DIAGNOSIS — E782 Mixed hyperlipidemia: Secondary | ICD-10-CM

## 2020-12-07 LAB — COMPREHENSIVE METABOLIC PANEL
ALT: 49 U/L — ABNORMAL HIGH (ref 0–44)
AST: 33 U/L (ref 15–41)
Albumin: 4 g/dL (ref 3.5–5.0)
Alkaline Phosphatase: 61 U/L (ref 38–126)
Anion gap: 10 (ref 5–15)
BUN: 11 mg/dL (ref 6–20)
CO2: 21 mmol/L — ABNORMAL LOW (ref 22–32)
Calcium: 8.5 mg/dL — ABNORMAL LOW (ref 8.9–10.3)
Chloride: 106 mmol/L (ref 98–111)
Creatinine, Ser: 0.63 mg/dL (ref 0.44–1.00)
GFR, Estimated: >60 mL/min (ref >60)
Glucose, Bld: 130 mg/dL — ABNORMAL HIGH (ref 70–99)
Potassium: 3.4 mmol/L — ABNORMAL LOW (ref 3.5–5.1)
Sodium: 137 mmol/L (ref 135–145)
Total Bilirubin: 0.1 mg/dL — ABNORMAL LOW (ref 0.3–1.2)
Total Protein: 6.7 g/dL (ref 6.5–8.1)

## 2020-12-07 LAB — CBC WITH DIFFERENTIAL/PLATELET
Abs Immature Granulocytes: 0.03 10*3/uL (ref 0.00–0.07)
Basophils Absolute: 0.1 10*3/uL (ref 0.0–0.1)
Basophils Relative: 2 %
Eosinophils Absolute: 0.3 10*3/uL (ref 0.0–0.5)
Eosinophils Relative: 4 %
HCT: 36.7 % (ref 36.0–46.0)
Hemoglobin: 12.3 g/dL (ref 12.0–15.0)
Immature Granulocytes: 0 %
Lymphocytes Relative: 53 %
Lymphs Abs: 3.6 10*3/uL (ref 0.7–4.0)
MCH: 31.1 pg (ref 26.0–34.0)
MCHC: 33.5 g/dL (ref 30.0–36.0)
MCV: 92.9 fL (ref 80.0–100.0)
Monocytes Absolute: 0.3 10*3/uL (ref 0.1–1.0)
Monocytes Relative: 4 %
Neutro Abs: 2.5 10*3/uL (ref 1.7–7.7)
Neutrophils Relative %: 37 %
Platelets: 326 10*3/uL (ref 150–400)
RBC: 3.95 MIL/uL (ref 3.87–5.11)
RDW: 12.7 % (ref 11.5–15.5)
WBC: 6.7 10*3/uL (ref 4.0–10.5)
nRBC: 0 % (ref 0.0–0.2)

## 2020-12-07 LAB — PREGNANCY, URINE: Preg Test, Ur: NEGATIVE

## 2020-12-07 MED ORDER — HEPARIN SOD (PORK) LOCK FLUSH 100 UNIT/ML IV SOLN
500.0000 [IU] | Freq: Once | INTRAVENOUS | Status: AC | PRN
  Filled 2020-12-07: qty 5

## 2020-12-07 MED ORDER — SODIUM CHLORIDE 0.9 % IV SOLN
10.0000 mg | Freq: Once | INTRAVENOUS | Status: AC
  Administered 2020-12-07: 10 mg via INTRAVENOUS
  Filled 2020-12-07: qty 10

## 2020-12-07 MED ORDER — ACETAMINOPHEN 325 MG PO TABS
650.0000 mg | ORAL_TABLET | Freq: Once | ORAL | Status: AC
  Administered 2020-12-07: 650 mg via ORAL
  Filled 2020-12-07: qty 2

## 2020-12-07 MED ORDER — TRASTUZUMAB-DKST CHEMO 150 MG IV SOLR
2.0000 mg/kg | Freq: Once | INTRAVENOUS | Status: AC
  Administered 2020-12-07: 168 mg via INTRAVENOUS
  Filled 2020-12-07: qty 8

## 2020-12-07 MED ORDER — SODIUM CHLORIDE 0.9 % IV SOLN
80.0000 mg/m2 | Freq: Once | INTRAVENOUS | Status: AC
  Administered 2020-12-07: 156 mg via INTRAVENOUS
  Filled 2020-12-07: qty 26

## 2020-12-07 MED ORDER — DIPHENHYDRAMINE HCL 50 MG/ML IJ SOLN
50.0000 mg | Freq: Once | INTRAMUSCULAR | Status: AC
  Administered 2020-12-07: 50 mg via INTRAVENOUS
  Filled 2020-12-07: qty 1

## 2020-12-07 MED ORDER — SODIUM CHLORIDE 0.9% FLUSH
10.0000 mL | INTRAVENOUS | Status: DC | PRN
  Filled 2020-12-07: qty 10

## 2020-12-07 MED ORDER — SODIUM CHLORIDE 0.9 % IV SOLN
Freq: Once | INTRAVENOUS | Status: AC
  Filled 2020-12-07: qty 250

## 2020-12-07 MED ORDER — FAMOTIDINE 20 MG IN NS 100 ML IVPB
20.0000 mg | Freq: Once | INTRAVENOUS | Status: AC
  Administered 2020-12-07: 20 mg via INTRAVENOUS
  Filled 2020-12-07: qty 20

## 2020-12-07 MED ORDER — PROCHLORPERAZINE EDISYLATE 10 MG/2ML IJ SOLN
10.0000 mg | Freq: Once | INTRAMUSCULAR | Status: AC
  Administered 2020-12-07: 10 mg via INTRAVENOUS
  Filled 2020-12-07: qty 2

## 2020-12-07 MED ORDER — HEPARIN SOD (PORK) LOCK FLUSH 100 UNIT/ML IV SOLN
INTRAVENOUS | Status: AC
  Administered 2020-12-07: 500 [IU]
  Filled 2020-12-07: qty 5

## 2020-12-07 MED ORDER — SODIUM CHLORIDE 0.9% FLUSH
10.0000 mL | Freq: Once | INTRAVENOUS | Status: DC
  Filled 2020-12-07: qty 10

## 2020-12-07 NOTE — Assessment & Plan Note (Signed)
#  STAGE-I- Right breast-invasive mammary carcinoma- pT1bN0 [25m]- ER/PR-Positive; Her-2 positive.Proceed with Taxol [name]-weekly x12 followed by [name] every 3 weeks for 12 months.OCT 2022-  MUGA scan- 58%.  # Proceed with weekly Taxol-[name] #3 today. Labs today reviewed;  acceptable for treatment today.   # Nausea- no vomiting-improved with antiemetics-STABLE  # Diarrhea- G-1-2; immodium- STABLE  # Headaches/Migraines-on Fioricet/amlotriptan; and also Emgalti SQ.-STABLE  # Fibromyalgia- ok to proceed with  Injections/ortho.   # DISPOSITION: # chemo today.  # as planned- follow up in dec 1st or dec 2nd- MD; labs- cbc/cmp; taxol-[name] weekly- Dr.B

## 2020-12-07 NOTE — Progress Notes (Signed)
Patient has nausea a few days after treatment then transitions to diarrhea that will last 2 days.  Also developed a rash on her head after last treatment.    Chronic pain seems to be getting worse and is diagnosed with Fibromyalgia.  Wants to know if she can be evaluated by Fibromyalgia MD for possible injections.

## 2020-12-07 NOTE — Progress Notes (Signed)
[facility] [place] NOTE  Patient Care Team: [name], MD as PCP - General (Family Medicine)  CHIEF COMPLAINTS/PURPOSE OF CONSULTATION: Breast cancer  #  Oncology History Overview Note   #Right breast-invasive mammary carcinoma 2 o'clock position #1.1 cm-ER/PR HER2/neu positive [3+]; SEP, 2022-MRI-up to 3 cm non-mass enhancement noted.  #Left breast- MRI #upper outer quadrant- 17m #retroareolar 1cm-. NEGATIVE for malignancy   EProcedure: Lumpectomy  Specimen Laterality: Right  Histologic Type: Invasive ductal carcinoma  Histologic Grade:       Glandular (Acinar)/Tubular Differentiation: 3       Nuclear Pleomorphism: 2       Mitotic Rate: 1       Overall Grade: 2  Tumor Size: 0.9 cm  Ductal Carcinoma In Situ: Present, intermediate grade  Treatment Effect in the Breast: No known presurgical therapy  Margins: All margins negative for invasive carcinoma       Distance from Closest Margin (mm): Cannot be determined  DCIS Margins: Uninvolved by DCIS       Distance from Closest Margin (mm): Cannot be determined  Regional Lymph Nodes:       Number of Lymph Nodes Examined: 5       Number of Sentinel Nodes Examined: 5       Number of Lymph Nodes with Macrometastases (>2 mm): 0       Number of Lymph Nodes with Micrometastases: 0       Number of Lymph Nodes with Isolated Tumor Cells (=0.2 mm or =200  cells): 0       Size of Largest Metastatic Deposit (mm): Not applicable       Extranodal Extension: Not applicable  Distant Metastasis:       Distant Site(s) Involved: Not applicable  Breast Biomarker Testing Performed on Previous Biopsy:       Testing Performed on Case Number: [id number]             Estrogen Receptor: 95%, positive, strong staining intensity             Progesterone Receptor: 95%, positive, strong staining  intensity             HER2: Positive (3+)             Ki-67: 15%  Pathologic Stage Classification (pTNM, AJCC 8th Edition): pT1b, pN0   Representative Tumor Block: A4  Comment(s): None   # NOV 4th, 2022- TAXOL-[name] q W  ---------------------------------   # Headaches-Migraine/ Anxiety [[name]; Dr.Yan  botox]  #Genetic testing-s/p genetic counseling-expanded panel negative.  #Reproductive counseling: DECLINED reproductive endocrinology.     Carcinoma of upper-inner quadrant of right breast in female, estrogen receptor positive ([facility]  09/21/2020 Initial Diagnosis   Malignant neoplasm of upper-inner quadrant of right female breast ([facility]   10/01/2020 Genetic Testing   Negative hereditary cancer genetic testing: no pathogenic variants detected in [name] and [name].  The report dates are October 01, 2020 and October 11, 2020, respectively.    The BRCAplus panel offered by [name] includes sequencing and deletion/duplication analysis for the following 8 genes: ATM, BRCA1, BRCA2, CDH1, CHEK2, PALB2, PTEN, and TP53.  The CustomNext-Cancer+RNAinsight panel offered by [facility] sequencing and rearrangement analysis for the following 47 genes:  APC, ATM, AXIN2, BARD1, BMPR1A, BRCA1, BRCA2, BRIP1, CDH1, CDK4, CDKN2A, CHEK2, DICER1, EPCAM, GREM1, HOXB13, MEN1, MLH1, MSH2, MSH3, MSH6, MUTYH, NBN, NF1, NF2, NTHL1, PALB2, PMS2, POLD1, POLE, PTEN, RAD51C, RAD51D, RECQL, RET, SDHA, SDHAF2,  SDHB, SDHC, SDHD, SMAD4, SMARCA4, STK11, TP53, TSC1, TSC2, and VHL.  RNA data is routinely analyzed for use in variant interpretation for all genes.   11/23/2020 -  Chemotherapy   Patient is on Treatment Plan : BREAST Paclitaxel + Trastuzumab q7d / Trastuzumab q21d        HISTORY OF PRESENTING ILLNESS: Ambulating independently.  Alone.  [name] 40 y.o.  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  Patient continues to complain of mild to moderate nausea without vomiting.  Improved  with antiemetics.  Denies any significant tingling or numbness.  Complains of mild diarrhea post Taxol.  Improved with Imodium.  Complains of flareup of fibromyalgia.  Review of Systems  Constitutional:  Negative for chills, diaphoresis, fever, malaise/fatigue and weight loss.  HENT:  Negative for nosebleeds and sore throat.   Eyes:  Negative for double vision.  Respiratory:  Negative for cough, hemoptysis, sputum production, shortness of breath and wheezing.   Cardiovascular:  Negative for chest pain, palpitations, orthopnea and leg swelling.  Gastrointestinal:  Negative for abdominal pain, blood in stool, constipation, diarrhea, heartburn, melena, nausea and vomiting.  Genitourinary:  Negative for dysuria, frequency and urgency.  Musculoskeletal:  Positive for myalgias. Negative for back pain and joint pain.  Skin: Negative.  Negative for itching and rash.  Neurological:  Positive for headaches. Negative for dizziness, tingling, focal weakness and weakness.  Endo/Heme/Allergies:  Does not bruise/bleed easily.  Psychiatric/Behavioral:  Negative for depression. The patient is nervous/anxious and has insomnia.   All other systems reviewed and are negative.   MEDICAL HISTORY:  Past Medical History:  Diagnosis Date   Acid reflux    Anxiety and depression    Dr. Gala Murdoch, MD   Back pain    Family history of breast cancer 09/21/2020   Family history of malignant neoplasm of digestive organ 09/21/2020   Fibromyalgia    History of gallstones    Malignant neoplasm of upper-inner quadrant of right female breast ([place]) 09/21/2020   Migraine     SURGICAL HISTORY: Past Surgical History:  Procedure Laterality Date   BREAST LUMPECTOMY WITH RADIOACTIVE SEED LOCALIZATION Right 10/17/2020   Procedure: RIGHT BREAST LUMPECTOMY WITH RADIOACTIVE SEED LOCALIZATION WITH RIGHT SENTINEL LYMPH NODE BIOPSY;  Surgeon: Stark Klein, MD;  Location: [facility];  Service: General;  Laterality: Right;    CHOLECYSTECTOMY  12/2004   PORTACATH PLACEMENT Left 10/17/2020   Procedure: INSERTION PORT-A-CATH;  Surgeon: Stark Klein, MD;  Location: [facility];  Service: General;  Laterality: Left;    SOCIAL HISTORY: Social History   Socioeconomic History   Marital status: Divorced    Spouse name: Corene Cornea   Number of children: 0   Years of education: MS   Highest education level: Not on file  Occupational History   Occupation: Optometrist  Tobacco Use   Smoking status: Never   Smokeless tobacco: Never  Vaping Use   Vaping Use: Never used  Substance and Sexual Activity   Alcohol use: Yes    Comment: pt states 1 glass of wine maybe once a month   Drug use: No   Sexual activity: Yes    Birth control/protection: Condom  Other Topics Concern   Not on file  Social History Narrative   Accountant for commercial firm; wants short term disability;  never smoked; 2 drinks a month. Lives in [place] with boy friend [[place] No children. Has one  brother    Social Determinants of Radio broadcast assistant Strain: Not on file  Food Insecurity: Not on file  Transportation Needs: Not on file  Physical Activity: Not on file  Stress: Not on file  Social Connections: Not on file  Intimate Partner Violence: Not on file    FAMILY HISTORY: Family History  Problem Relation Age of Onset   Healthy Mother    Hypertension Father    Skin cancer Brother        dx before 45   Colon cancer Maternal Aunt 50   Cervical cancer Maternal Aunt        dx before 31; x2   Cancer Paternal Grandmother 99       unknown primary; ? liver   Lung cancer Paternal Grandfather        dx after 94   Breast cancer Cousin 31       maternal cousin   Colon cancer Other        MGM's sister   Gastric cancer Other        PGM's sister   Leukemia Other        PMG's sister   Rectal cancer Neg Hx    Esophageal cancer Neg Hx     ALLERGIES:  is allergic to antihistamines, diphenhydramine-type; diphenhydramine  hcl; promethazine; and pseudoeph-doxylamine-dm-apap.  MEDICATIONS:  Current Outpatient Medications  Medication Sig Dispense Refill   almotriptan (AXERT) 12.5 MG tablet Take 1 tab at onset of migraine.  May repeat in 2 hrs, if needed.  Max dose: 2 tabs/day. This is a 30 day prescription. 12 tablet 11   azelastine (ASTELIN) 0.1 % nasal spray Place 1 spray into both nostrils daily as needed for rhinitis.     BOTOX 200 units SOLR Inject 200 Units into the skin See admin instructions. Every 90 days     buPROPion (WELLBUTRIN XL) 300 MG 24 hr tablet Take 300 mg by mouth every morning.     butalbital-acetaminophen-caffeine (FIORICET) 50-325-40 MG tablet Take 1 tablet by mouth every 8 (eight) hours as needed for headache. 10 tablet 5   clonazePAM (KLONOPIN) 1 MG tablet Take 0.5-1 mg by mouth 2 (two) times daily as needed for anxiety.     EMGALITY 120 MG/ML SOAJ Inject 120 mg into the skin every 30 (thirty) days. 3 mL 4   lidocaine-prilocaine (EMLA) cream Apply 1 application topically as needed. 30 g 0   omeprazole (PRILOSEC) 20 MG capsule Take 20 mg by mouth daily.     ondansetron (ZOFRAN) 8 MG tablet Take 1 tablet (8 mg total) by mouth every 8 (eight) hours as needed for nausea or vomiting. 30 tablet 3   OVER THE COUNTER MEDICATION Take 1 tablet by mouth daily. prevent a migraine     Rimegepant Sulfate (NURTEC) 75 MG TBDP Take 75 mg by mouth as needed (Take 1 at onset of headache, max is 75 mg in 24 hours). 8 tablet 11   rosuvastatin (CRESTOR) 20 MG tablet Take 20 mg by mouth daily.     tiZANidine (ZANAFLEX) 4 MG tablet Take 1 tablet (4 mg total) by mouth every 8 (eight) hours as needed for muscle spasms (migraines). This is a 30 day rx. 20 tablet 5   topiramate (TOPAMAX) 100 MG tablet Take 1 tablet (100 mg total) by mouth 2 (two) times daily. (Patient taking differently: Take 200 mg by mouth at bedtime.) 60 tablet 11   traMADol (ULTRAM) 50 MG tablet Take 50 mg by mouth  every 6 (six) hours as needed for  pain.     vortioxetine HBr (TRINTELLIX) 20 MG TABS tablet Take 20 mg by mouth daily.  0   zolpidem (AMBIEN) 10 MG tablet Take 10 mg by mouth at bedtime as needed for sleep.     diazepam (VALIUM) 5 MG tablet One pill 45-60 mins prior to procedure; 1 pill 15 mins prior if needed. (Patient not taking: Reported on 12/07/2020) 6 tablet 0   oxyCODONE (OXY IR/ROXICODONE) 5 MG immediate release tablet Take 1 tablet (5 mg total) by mouth every 6 (six) hours as needed for severe pain. (Patient not taking: Reported on 12/07/2020) 10 tablet 0   No current facility-administered medications for this visit.   Facility-Administered Medications Ordered in Other Visits  Medication Dose Route Frequency Provider Last Rate Last Admin   sodium chloride flush (NS) 0.9 % injection 10 mL  10 mL Intravenous Once Cammie Sickle, MD          .  PHYSICAL EXAMINATION: ECOG PERFORMANCE STATUS: 0 - Asymptomatic  Vitals:   12/07/20 0859  BP: 126/85  Pulse: 90  Resp: 16  Temp: 98.8 F (37.1 C)    Filed Weights   12/07/20 0859  Weight: 178 lb 6.4 oz (80.9 kg)     Physical Exam Vitals and nursing note reviewed.  HENT:     Head: Normocephalic and atraumatic.     Mouth/Throat:     Pharynx: Oropharynx is clear.  Eyes:     Extraocular Movements: Extraocular movements intact.     Pupils: Pupils are equal, round, and reactive to light.  Cardiovascular:     Rate and Rhythm: Normal rate and regular rhythm.  Abdominal:     Palpations: Abdomen is soft.  Musculoskeletal:        General: Normal range of motion.     Cervical back: Normal range of motion.  Skin:    General: Skin is warm.  Neurological:     General: No focal deficit present.     Mental Status: She is alert and oriented to person, place, and time.  Psychiatric:        Behavior: Behavior normal.        Judgment: Judgment normal.     LABORATORY DATA:  I have reviewed the data as listed Lab Results  Component Value Date   WBC 6.7  12/07/2020   HGB 12.3 12/07/2020   HCT 36.7 12/07/2020   MCV 92.9 12/07/2020   PLT 326 12/07/2020   Recent Labs    11/23/20 0824 11/30/20 0838 12/07/20 0838  NA 136 140 137  K 3.8 3.8 3.4*  CL 105 110 106  CO2 20* 23 21*  GLUCOSE 111* 112* 130*  BUN 11 9 11   CREATININE 0.83 0.77 0.63  CALCIUM 8.9 8.8* 8.5*  GFRNONAA >60 >60 >60  PROT 7.7 6.6 6.7  ALBUMIN 4.3 3.9 4.0  AST 31 34 33  ALT 40 51* 49*  ALKPHOS 52 56 61  BILITOT 0.4 0.3 0.1*    RADIOGRAPHIC STUDIES: I have personally reviewed the radiological images as listed and agreed with the findings in the report. No results found.  ASSESSMENT & PLAN:   Carcinoma of upper-inner quadrant of right breast in female, estrogen receptor positive ([place]) # STAGE-I- Right breast-invasive mammary carcinoma- pT1bN0 [22m]- ER/PR-Positive; Her-2 positive.Proceed with Taxol [name]-weekly x12 followed by [name] every 3 weeks for 12 months.OCT 2022-  MUGA scan- 58%.  # Proceed with weekly Taxol-[name] #3 today. Labs today reviewed;  acceptable for treatment today.   # Nausea- no vomiting-improved with antiemetics-STABLE  # Diarrhea- G-1-2; immodium- STABLE  # Headaches/Migraines-on Fioricet/amlotriptan; and also Emgalti SQ.-STABLE  # Fibromyalgia- ok to proceed with  Injections/ortho.   # DISPOSITION: # chemo today.  # as planned- follow up in dec 1st or dec 2nd- MD; labs- cbc/cmp; taxol-[name] weekly- Dr.B   All questions were answered. The patient/family knows to call the clinic with any problems, questions or concerns.     Cammie Sickle, MD 12/07/2020 9:50 AM

## 2020-12-07 NOTE — Progress Notes (Signed)
Lipid panel order entered per written order from [name], MD at [facility] (P: [phone]).  Results will be directed to ordering provider, Dr. Mannie Stabile when available.

## 2020-12-07 NOTE — Progress Notes (Signed)
Requests antiemetic pre-medication due to nausea following infusion. Compazine 10mg  IV given per orders.

## 2020-12-07 NOTE — Patient Instructions (Signed)
[facility]  Discharge Instructions: Thank you for choosing [facility] to provide your oncology and hematology care.  If you have a lab appointment with the [facility], please go directly to the [facility] and check in at the registration area.  Wear comfortable clothing and clothing appropriate for easy access to any Portacath or PICC line.   We strive to give you quality time with your provider. You may need to reschedule your appointment if you arrive late (15 or more minutes).  Arriving late affects you and other patients whose appointments are after yours.  Also, if you miss three or more appointments without notifying the office, you may be dismissed from the clinic at the provider's discretion.      For prescription refill requests, have your pharmacy contact our office and allow 72 hours for refills to be completed.    Today you received the following chemotherapy and/or immunotherapy agents - paclitaxel, trastuzumab      To help prevent nausea and vomiting after your treatment, we encourage you to take your nausea medication as directed.  BELOW ARE SYMPTOMS THAT SHOULD BE REPORTED IMMEDIATELY: *FEVER GREATER THAN 100.4 F (38 C) OR HIGHER *CHILLS OR SWEATING *NAUSEA AND VOMITING THAT IS NOT CONTROLLED WITH YOUR NAUSEA MEDICATION *UNUSUAL SHORTNESS OF BREATH *UNUSUAL BRUISING OR BLEEDING *URINARY PROBLEMS (pain or burning when urinating, or frequent urination) *BOWEL PROBLEMS (unusual diarrhea, constipation, pain near the anus) TENDERNESS IN MOUTH AND THROAT WITH OR WITHOUT PRESENCE OF ULCERS (sore throat, sores in mouth, or a toothache) UNUSUAL RASH, SWELLING OR PAIN  UNUSUAL VAGINAL DISCHARGE OR ITCHING   Items with * indicate a potential emergency and should be followed up as soon as possible or go to the Emergency Department if any problems should occur.  Please show the CHEMOTHERAPY ALERT CARD or IMMUNOTHERAPY ALERT  CARD at check-in to the Emergency Department and triage nurse.  Should you have questions after your visit or need to cancel or reschedule your appointment, please contact [facility]  [phone] and follow the prompts.  Office hours are 8:00 a.m. to 4:30 p.m. Monday - Friday. Please note that voicemails left after 4:00 p.m. may not be returned until the following business day.  We are closed weekends and major holidays. You have access to a nurse at all times for urgent questions. Please call the main number to the clinic [phone] and follow the prompts.  For any non-urgent questions, you may also contact your provider using MyChart. We now offer e-Visits for anyone 59 and older to request care online for non-urgent symptoms. For details visit [email or web address].   Also download the MyChart app! Go to the app store, search "MyChart", open the app, select , and log in with your MyChart username and password.  Due to Covid, a mask is required upon entering the hospital/clinic. If you do not have a mask, one will be given to you upon arrival. For doctor visits, patients may have 1 support person aged 87 or older with them. For treatment visits, patients cannot have anyone with them due to current Covid guidelines and our immunocompromised population.   Trastuzumab injection for infusion What is this medication? TRASTUZUMAB (tras TOO zoo mab) is a monoclonal antibody. It is used to treat breast cancer and stomach cancer. This medicine may be used for other purposes; ask your health care provider or pharmacist if you have questions. COMMON BRAND NAME(S): [name], [name], [name],  [name] What should I tell my care team before I take this medication? They need to know if you have any of these conditions: heart disease heart failure lung or breathing disease, like asthma an unusual or allergic reaction to trastuzumab,  benzyl alcohol, or other medications, foods, dyes, or preservatives pregnant or trying to get pregnant breast-feeding How should I use this medication? This drug is given as an infusion into a vein. It is administered in a hospital or clinic by a specially trained health care professional. Talk to your pediatrician regarding the use of this medicine in children. This medicine is not approved for use in children. Overdosage: If you think you have taken too much of this medicine contact a poison control center or emergency room at once. NOTE: This medicine is only for you. Do not share this medicine with others. What if I miss a dose? It is important not to miss a dose. Call your doctor or health care professional if you are unable to keep an appointment. What may interact with this medication? This medicine may interact with the following medications: certain types of chemotherapy, such as daunorubicin, doxorubicin, epirubicin, and idarubicin This list may not describe all possible interactions. Give your health care provider a list of all the medicines, herbs, non-prescription drugs, or dietary supplements you use. Also tell them if you smoke, drink alcohol, or use illegal drugs. Some items may interact with your medicine. What should I watch for while using this medication? Visit your doctor for checks on your progress. Report any side effects. Continue your course of treatment even though you feel ill unless your doctor tells you to stop. Call your doctor or health care professional for advice if you get a fever, chills or sore throat, or other symptoms of a cold or flu. Do not treat yourself. Try to avoid being around people who are sick. You may experience fever, chills and shaking during your first infusion. These effects are usually mild and can be treated with other medicines. Report any side effects during the infusion to your health care professional. Fever and chills usually do not happen  with later infusions. Do not become pregnant while taking this medicine or for 7 months after stopping it. Women should inform their doctor if they wish to become pregnant or think they might be pregnant. Women of child-bearing potential will need to have a negative pregnancy test before starting this medicine. There is a potential for serious side effects to an unborn child. Talk to your health care professional or pharmacist for more information. Do not breast-feed an infant while taking this medicine or for 7 months after stopping it. Women must use effective birth control with this medicine. What side effects may I notice from receiving this medication? Side effects that you should report to your doctor or health care professional as soon as possible: allergic reactions like skin rash, itching or hives, swelling of the face, lips, or tongue chest pain or palpitations cough dizziness feeling faint or lightheaded, falls fever general ill feeling or flu-like symptoms signs of worsening heart failure like breathing problems; swelling in your legs and feet unusually weak or tired Side effects that usually do not require medical attention (report to your doctor or health care professional if they continue or are bothersome): bone painPaclitaxel injection What is this medication? PACLITAXEL (PAK li TAX el) is a chemotherapy drug. It targets fast dividing cells, like cancer cells, and causes these cells to die. This medicine is  used to treat ovarian cancer, breast cancer, lung cancer, Kaposi's sarcoma, and other cancers. This medicine may be used for other purposes; ask your health care provider or pharmacist if you have questions. COMMON BRAND NAME(S): Onxol, Taxol What should I tell my care team before I take this medication? They need to know if you have any of these conditions: history of irregular heartbeat liver disease low blood counts, like low white cell, platelet, or red cell  counts lung or breathing disease, like asthma tingling of the fingers or toes, or other nerve disorder an unusual or allergic reaction to paclitaxel, alcohol, polyoxyethylated castor oil, other chemotherapy, other medicines, foods, dyes, or preservatives pregnant or trying to get pregnant breast-feeding How should I use this medication? This drug is given as an infusion into a vein. It is administered in a hospital or clinic by a specially trained health care professional. Talk to your pediatrician regarding the use of this medicine in children. Special care may be needed. Overdosage: If you think you have taken too much of this medicine contact a poison control center or emergency room at once. NOTE: This medicine is only for you. Do not share this medicine with others. What if I miss a dose? It is important not to miss your dose. Call your doctor or health care professional if you are unable to keep an appointment. What may interact with this medication? Do not take this medicine with any of the following medications: live virus vaccines This medicine may also interact with the following medications: antiviral medicines for hepatitis, HIV or AIDS certain antibiotics like erythromycin and clarithromycin certain medicines for fungal infections like ketoconazole and itraconazole certain medicines for seizures like carbamazepine, phenobarbital, phenytoin gemfibrozil nefazodone rifampin St. John's wort This list may not describe all possible interactions. Give your health care provider a list of all the medicines, herbs, non-prescription drugs, or dietary supplements you use. Also tell them if you smoke, drink alcohol, or use illegal drugs. Some items may interact with your medicine. What should I watch for while using this medication? Your condition will be monitored carefully while you are receiving this medicine. You will need important blood work done while you are taking this  medicine. This medicine can cause serious allergic reactions. To reduce your risk you will need to take other medicine(s) before treatment with this medicine. If you experience allergic reactions like skin rash, itching or hives, swelling of the face, lips, or tongue, tell your doctor or health care professional right away. In some cases, you may be given additional medicines to help with side effects. Follow all directions for their use. This drug may make you feel generally unwell. This is not uncommon, as chemotherapy can affect healthy cells as well as cancer cells. Report any side effects. Continue your course of treatment even though you feel ill unless your doctor tells you to stop. Call your doctor or health care professional for advice if you get a fever, chills or sore throat, or other symptoms of a cold or flu. Do not treat yourself. This drug decreases your body's ability to fight infections. Try to avoid being around people who are sick. This medicine may increase your risk to bruise or bleed. Call your doctor or health care professional if you notice any unusual bleeding. Be careful brushing and flossing your teeth or using a toothpick because you may get an infection or bleed more easily. If you have any dental work done, tell your dentist you are receiving  this medicine. Avoid taking products that contain aspirin, acetaminophen, ibuprofen, naproxen, or ketoprofen unless instructed by your doctor. These medicines may hide a fever. Do not become pregnant while taking this medicine. Women should inform their doctor if they wish to become pregnant or think they might be pregnant. There is a potential for serious side effects to an unborn child. Talk to your health care professional or pharmacist for more information. Do not breast-feed an infant while taking this medicine. Men are advised not to father a child while receiving this medicine. This product may contain alcohol. Ask your pharmacist  or healthcare provider if this medicine contains alcohol. Be sure to tell all healthcare providers you are taking this medicine. Certain medicines, like metronidazole and disulfiram, can cause an unpleasant reaction when taken with alcohol. The reaction includes flushing, headache, nausea, vomiting, sweating, and increased thirst. The reaction can last from 30 minutes to several hours. What side effects may I notice from receiving this medication? Side effects that you should report to your doctor or health care professional as soon as possible: allergic reactions like skin rash, itching or hives, swelling of the face, lips, or tongue breathing problems changes in vision fast, irregular heartbeat high or low blood pressure mouth sores pain, tingling, numbness in the hands or feet signs of decreased platelets or bleeding - bruising, pinpoint red spots on the skin, black, tarry stools, blood in the urine signs of decreased red blood cells - unusually weak or tired, feeling faint or lightheaded, falls signs of infection - fever or chills, cough, sore throat, pain or difficulty passing urine signs and symptoms of liver injury like dark yellow or brown urine; general ill feeling or flu-like symptoms; light-colored stools; loss of appetite; nausea; right upper belly pain; unusually weak or tired; yellowing of the eyes or skin swelling of the ankles, feet, hands unusually slow heartbeat Side effects that usually do not require medical attention (report to your doctor or health care professional if they continue or are bothersome): diarrhea hair loss loss of appetite muscle or joint pain nausea, vomiting pain, redness, or irritation at site where injected tiredness This list may not describe all possible side effects. Call your doctor for medical advice about side effects. You may report side effects to FDA at 1-800-FDA-1088. Where should I keep my medication? This drug is given in a hospital or  clinic and will not be stored at home. NOTE: This sheet is a summary. It may not cover all possible information. If you have questions about this medicine, talk to your doctor, pharmacist, or health care provider.  2022 Elsevier/Gold Standard (2020-09-25 00:00:00)  changes in taste diarrhea joint pain nausea/vomiting weight loss This list may not describe all possible side effects. Call your doctor for medical advice about side effects. You may report side effects to FDA at 1-800-FDA-1088. Where should I keep my medication? This drug is given in a hospital or clinic and will not be stored at home. NOTE: This sheet is a summary. It may not cover all possible information. If you have questions about this medicine, talk to your doctor, pharmacist, or health care provider.  2022 Elsevier/Gold Standard (2016-01-22 00:00:00)

## 2020-12-12 ENCOUNTER — Inpatient Hospital Stay (HOSPITAL_BASED_OUTPATIENT_CLINIC_OR_DEPARTMENT_OTHER): Payer: PRIVATE HEALTH INSURANCE | Admitting: Hospice and Palliative Medicine

## 2020-12-12 ENCOUNTER — Other Ambulatory Visit: Payer: Self-pay

## 2020-12-12 DIAGNOSIS — R21 Rash and other nonspecific skin eruption: Secondary | ICD-10-CM | POA: Diagnosis not present

## 2020-12-12 MED ORDER — PREDNISONE 10 MG (21) PO TBPK: ORAL_TABLET | ORAL | 0 refills | Status: DC

## 2020-12-12 NOTE — Progress Notes (Signed)
Virtual Visit via Video Note  I connected with Alexandra Warren on 12/12/20 at  2:00 PM EST by a video enabled telemedicine application and verified that I am speaking with the correct person using two identifiers.  Location: Patient: Home Provider: Clinic   I discussed the limitations of evaluation and management by telemedicine and the availability of in person appointments. The patient expressed understanding and agreed to proceed.  History of Present Illness: Alexandra Warren is a 40 year old woman with stage I triple positive breast cancer status postlumpectomy now on systemic treatment with Taxol/[name] who developed a pruritic rash to her scalp following last chemotherapy on 12/07/2020.   Observations/Objective: Patient reports that she developed a similar pruritic rash to her scalp during previous cycles of chemotherapy and that it resolved.  However, this recurred during last chemotherapy (12/07/2020) and has persisted.  She describes scalp as being itchy.  She has no other rashes or symptomatic complaints.  No fever or chills reported.  She describes the rash as small red bumps appearing like "pimples."  She has been using hydrocortisone cream/spray and loratadine with little improvement.  Assessment and Plan: Rash -suspect drug eruption secondary to chemo.  Discussed option of trial antibiotic versus steroid.  Patient voiced preference for trial of steroid.  We will start her on prednisone taper.  Patient has scheduled follow-up next week with Dr. Rogue Bussing.  Case and plan discussed with Dr. Rogue Bussing  Follow Up Instructions: RTC next week as previously scheduled   I discussed the assessment and treatment plan with the patient. The patient was provided an opportunity to ask questions and all were answered. The patient agreed with the plan and demonstrated an understanding of the instructions.   The patient was advised to call back or seek an in-person evaluation if the symptoms  worsen or if the condition fails to improve as anticipated.  I provided 10 minutes of non-face-to-face time during this encounter.   Irean Hong, NP

## 2020-12-21 ENCOUNTER — Other Ambulatory Visit: Payer: Self-pay

## 2020-12-21 ENCOUNTER — Inpatient Hospital Stay: Payer: PRIVATE HEALTH INSURANCE | Attending: Internal Medicine

## 2020-12-21 ENCOUNTER — Inpatient Hospital Stay (HOSPITAL_BASED_OUTPATIENT_CLINIC_OR_DEPARTMENT_OTHER): Payer: PRIVATE HEALTH INSURANCE | Admitting: Internal Medicine

## 2020-12-21 ENCOUNTER — Encounter: Payer: Self-pay | Admitting: Internal Medicine

## 2020-12-21 ENCOUNTER — Inpatient Hospital Stay: Payer: PRIVATE HEALTH INSURANCE

## 2020-12-21 DIAGNOSIS — E782 Mixed hyperlipidemia: Secondary | ICD-10-CM

## 2020-12-21 DIAGNOSIS — Z79899 Other long term (current) drug therapy: Secondary | ICD-10-CM | POA: Insufficient documentation

## 2020-12-21 DIAGNOSIS — Z170 Estrogen receptor positive status [ER+]: Secondary | ICD-10-CM

## 2020-12-21 DIAGNOSIS — C50211 Malignant neoplasm of upper-inner quadrant of right female breast: Secondary | ICD-10-CM

## 2020-12-21 DIAGNOSIS — Z5111 Encounter for antineoplastic chemotherapy: Secondary | ICD-10-CM | POA: Insufficient documentation

## 2020-12-21 DIAGNOSIS — Z5112 Encounter for antineoplastic immunotherapy: Secondary | ICD-10-CM | POA: Insufficient documentation

## 2020-12-21 LAB — CBC WITH DIFFERENTIAL/PLATELET
Abs Immature Granulocytes: 0.16 10*3/uL — ABNORMAL HIGH (ref 0.00–0.07)
Basophils Absolute: 0.1 10*3/uL (ref 0.0–0.1)
Basophils Relative: 1 %
Eosinophils Absolute: 0.2 10*3/uL (ref 0.0–0.5)
Eosinophils Relative: 1 %
HCT: 38.2 % (ref 36.0–46.0)
Hemoglobin: 12.8 g/dL (ref 12.0–15.0)
Immature Granulocytes: 1 %
Lymphocytes Relative: 31 %
Lymphs Abs: 3.8 10*3/uL (ref 0.7–4.0)
MCH: 31.3 pg (ref 26.0–34.0)
MCHC: 33.5 g/dL (ref 30.0–36.0)
MCV: 93.4 fL (ref 80.0–100.0)
Monocytes Absolute: 1 10*3/uL (ref 0.1–1.0)
Monocytes Relative: 8 %
Neutro Abs: 7.1 10*3/uL (ref 1.7–7.7)
Neutrophils Relative %: 58 %
Platelets: 352 10*3/uL (ref 150–400)
RBC: 4.09 MIL/uL (ref 3.87–5.11)
RDW: 13.5 % (ref 11.5–15.5)
WBC: 12.3 10*3/uL — ABNORMAL HIGH (ref 4.0–10.5)
nRBC: 0 % (ref 0.0–0.2)

## 2020-12-21 LAB — LIPID PANEL
Cholesterol: 142 mg/dL (ref 0–200)
HDL: 54 mg/dL (ref >40)
LDL Cholesterol: 73 mg/dL (ref 0–99)
Total CHOL/HDL Ratio: 2.6 RATIO
Triglycerides: 73 mg/dL (ref <150)
VLDL: 15 mg/dL (ref 0–40)

## 2020-12-21 LAB — COMPREHENSIVE METABOLIC PANEL
ALT: 46 U/L — ABNORMAL HIGH (ref 0–44)
AST: 26 U/L (ref 15–41)
Albumin: 4 g/dL (ref 3.5–5.0)
Alkaline Phosphatase: 50 U/L (ref 38–126)
Anion gap: 8 (ref 5–15)
BUN: 14 mg/dL (ref 6–20)
CO2: 23 mmol/L (ref 22–32)
Calcium: 8.9 mg/dL (ref 8.9–10.3)
Chloride: 105 mmol/L (ref 98–111)
Creatinine, Ser: 0.8 mg/dL (ref 0.44–1.00)
GFR, Estimated: >60 mL/min (ref >60)
Glucose, Bld: 115 mg/dL — ABNORMAL HIGH (ref 70–99)
Potassium: 3.6 mmol/L (ref 3.5–5.1)
Sodium: 136 mmol/L (ref 135–145)
Total Bilirubin: 0.5 mg/dL (ref 0.3–1.2)
Total Protein: 6.9 g/dL (ref 6.5–8.1)

## 2020-12-21 LAB — PREGNANCY, URINE: Preg Test, Ur: NEGATIVE

## 2020-12-21 MED ORDER — FLUCONAZOLE 100 MG PO TABS: 100.0000 mg | ORAL_TABLET | Freq: Every day | ORAL | 0 refills | Status: DC

## 2020-12-21 MED ORDER — TRASTUZUMAB-DKST CHEMO 150 MG IV SOLR
2.0000 mg/kg | Freq: Once | INTRAVENOUS | Status: AC
  Administered 2020-12-21: 168 mg via INTRAVENOUS
  Filled 2020-12-21: qty 8

## 2020-12-21 MED ORDER — HEPARIN SOD (PORK) LOCK FLUSH 100 UNIT/ML IV SOLN
500.0000 [IU] | Freq: Once | INTRAVENOUS | Status: AC | PRN
  Administered 2020-12-21: 500 [IU]
  Filled 2020-12-21: qty 5

## 2020-12-21 MED ORDER — SODIUM CHLORIDE 0.9 % IV SOLN
80.0000 mg/m2 | Freq: Once | INTRAVENOUS | Status: AC
  Administered 2020-12-21: 156 mg via INTRAVENOUS
  Filled 2020-12-21: qty 26

## 2020-12-21 MED ORDER — LIDOCAINE-PRILOCAINE 2.5-2.5 % EX CREA: 1.0000 "application " | TOPICAL_CREAM | CUTANEOUS | 4 refills | Status: DC | PRN

## 2020-12-21 MED ORDER — SODIUM CHLORIDE 0.9 % IV SOLN
Freq: Once | INTRAVENOUS | Status: AC
  Filled 2020-12-21: qty 250

## 2020-12-21 MED ORDER — FAMOTIDINE 20 MG IN NS 100 ML IVPB
20.0000 mg | Freq: Once | INTRAVENOUS | Status: AC
  Administered 2020-12-21: 20 mg via INTRAVENOUS
  Filled 2020-12-21: qty 20

## 2020-12-21 MED ORDER — SODIUM CHLORIDE 0.9 % IV SOLN
10.0000 mg | Freq: Once | INTRAVENOUS | Status: AC
  Administered 2020-12-21: 10 mg via INTRAVENOUS
  Filled 2020-12-21: qty 10

## 2020-12-21 MED ORDER — TRIAMCINOLONE ACETONIDE 0.5 % EX OINT: 1.0000 "application " | TOPICAL_OINTMENT | Freq: Two times a day (BID) | CUTANEOUS | 1 refills | Status: DC

## 2020-12-21 MED ORDER — DIPHENHYDRAMINE HCL 50 MG/ML IJ SOLN
50.0000 mg | Freq: Once | INTRAMUSCULAR | Status: AC
  Administered 2020-12-21: 50 mg via INTRAVENOUS
  Filled 2020-12-21: qty 1

## 2020-12-21 MED ORDER — ACETAMINOPHEN 325 MG PO TABS
650.0000 mg | ORAL_TABLET | Freq: Once | ORAL | Status: AC
  Administered 2020-12-21: 650 mg via ORAL
  Filled 2020-12-21: qty 2

## 2020-12-21 NOTE — Patient Instructions (Signed)
[facility] [place]  Discharge Instructions: Thank you for choosing [facility] to provide your oncology and hematology care.  If you have a lab appointment with the [facility], please go directly to the [facility] and check in at the registration area.  Wear comfortable clothing and clothing appropriate for easy access to any Portacath or PICC line.   We strive to give you quality time with your provider. You may need to reschedule your appointment if you arrive late (15 or more minutes).  Arriving late affects you and other patients whose appointments are after yours.  Also, if you miss three or more appointments without notifying the office, you may be dismissed from the clinic at the provider's discretion.      For prescription refill requests, have your pharmacy contact our office and allow 72 hours for refills to be completed.    Today you received the following chemotherapy and/or immunotherapy agents: Taxol, [name]      To help prevent nausea and vomiting after your treatment, we encourage you to take your nausea medication as directed.  BELOW ARE SYMPTOMS THAT SHOULD BE REPORTED IMMEDIATELY: *FEVER GREATER THAN 100.4 F (38 C) OR HIGHER *CHILLS OR SWEATING *NAUSEA AND VOMITING THAT IS NOT CONTROLLED WITH YOUR NAUSEA MEDICATION *UNUSUAL SHORTNESS OF BREATH *UNUSUAL BRUISING OR BLEEDING *URINARY PROBLEMS (pain or burning when urinating, or frequent urination) *BOWEL PROBLEMS (unusual diarrhea, constipation, pain near the anus) TENDERNESS IN MOUTH AND THROAT WITH OR WITHOUT PRESENCE OF ULCERS (sore throat, sores in mouth, or a toothache) UNUSUAL RASH, SWELLING OR PAIN  UNUSUAL VAGINAL DISCHARGE OR ITCHING   Items with * indicate a potential emergency and should be followed up as soon as possible or go to the Emergency Department if any problems should occur.  Please show the CHEMOTHERAPY ALERT CARD or IMMUNOTHERAPY ALERT CARD at check-in  to the Emergency Department and triage nurse.  Should you have questions after your visit or need to cancel or reschedule your appointment, please contact [facility] [place] [facility] [place]  [phone] and follow the prompts.  Office hours are 8:00 a.m. to 4:30 p.m. Monday - Friday. Please note that voicemails left after 4:00 p.m. may not be returned until the following business day.  We are closed weekends and major holidays. You have access to a nurse at all times for urgent questions. Please call the main number to the clinic [phone] and follow the prompts.  For any non-urgent questions, you may also contact your provider using MyChart. We now offer e-Visits for anyone 22 and older to request care online for non-urgent symptoms. For details visit [email or web address].   Also download the MyChart app! Go to the app store, search "MyChart", open the app, select [facility], and log in with your MyChart username and password.  Due to Covid, a mask is required upon entering the hospital/clinic. If you do not have a mask, one will be given to you upon arrival. For doctor visits, patients may have 1 support person aged 44 or older with them. For treatment visits, patients cannot have anyone with them due to current Covid guidelines and our immunocompromised population. Paclitaxel injection What is this medication? PACLITAXEL (PAK li TAX el) is a chemotherapy drug. It targets fast dividing cells, like cancer cells, and causes these cells to die. This medicine is used to treat ovarian cancer, breast cancer, lung cancer, Kaposi's sarcoma, and other cancers. This medicine may be used for other purposes; ask your  health care provider or pharmacist if you have questions. COMMON BRAND NAME(S): Onxol, Taxol What should I tell my care team before I take this medication? They need to know if you have any of these conditions: history of irregular heartbeat liver disease low blood counts, like  low white cell, platelet, or red cell counts lung or breathing disease, like asthma tingling of the fingers or toes, or other nerve disorder an unusual or allergic reaction to paclitaxel, alcohol, polyoxyethylated castor oil, other chemotherapy, other medicines, foods, dyes, or preservatives pregnant or trying to get pregnant breast-feeding How should I use this medication? This drug is given as an infusion into a vein. It is administered in a hospital or clinic by a specially trained health care professional. Talk to your pediatrician regarding the use of this medicine in children. Special care may be needed. Overdosage: If you think you have taken too much of this medicine contact a poison control center or emergency room at once. NOTE: This medicine is only for you. Do not share this medicine with others. What if I miss a dose? It is important not to miss your dose. Call your doctor or health care professional if you are unable to keep an appointment. What may interact with this medication? Do not take this medicine with any of the following medications: live virus vaccines This medicine may also interact with the following medications: antiviral medicines for hepatitis, HIV or AIDS certain antibiotics like erythromycin and clarithromycin certain medicines for fungal infections like ketoconazole and itraconazole certain medicines for seizures like carbamazepine, phenobarbital, phenytoin gemfibrozil nefazodone rifampin St. John's wort This list may not describe all possible interactions. Give your health care provider a list of all the medicines, herbs, non-prescription drugs, or dietary supplements you use. Also tell them if you smoke, drink alcohol, or use illegal drugs. Some items may interact with your medicine. What should I watch for while using this medication? Your condition will be monitored carefully while you are receiving this medicine. You will need important blood work done  while you are taking this medicine. This medicine can cause serious allergic reactions. To reduce your risk you will need to take other medicine(s) before treatment with this medicine. If you experience allergic reactions like skin rash, itching or hives, swelling of the face, lips, or tongue, tell your doctor or health care professional right away. In some cases, you may be given additional medicines to help with side effects. Follow all directions for their use. This drug may make you feel generally unwell. This is not uncommon, as chemotherapy can affect healthy cells as well as cancer cells. Report any side effects. Continue your course of treatment even though you feel ill unless your doctor tells you to stop. Call your doctor or health care professional for advice if you get a fever, chills or sore throat, or other symptoms of a cold or flu. Do not treat yourself. This drug decreases your body's ability to fight infections. Try to avoid being around people who are sick. This medicine may increase your risk to bruise or bleed. Call your doctor or health care professional if you notice any unusual bleeding. Be careful brushing and flossing your teeth or using a toothpick because you may get an infection or bleed more easily. If you have any dental work done, tell your dentist you are receiving this medicine. Avoid taking products that contain aspirin, acetaminophen, ibuprofen, naproxen, or ketoprofen unless instructed by your doctor. These medicines may hide a fever.  Do not become pregnant while taking this medicine. Women should inform their doctor if they wish to become pregnant or think they might be pregnant. There is a potential for serious side effects to an unborn child. Talk to your health care professional or pharmacist for more information. Do not breast-feed an infant while taking this medicine. Men are advised not to father a child while receiving this medicine. This product may contain  alcohol. Ask your pharmacist or healthcare provider if this medicine contains alcohol. Be sure to tell all healthcare providers you are taking this medicine. Certain medicines, like metronidazole and disulfiram, can cause an unpleasant reaction when taken with alcohol. The reaction includes flushing, headache, nausea, vomiting, sweating, and increased thirst. The reaction can last from 30 minutes to several hours. What side effects may I notice from receiving this medication? Side effects that you should report to your doctor or health care professional as soon as possible: allergic reactions like skin rash, itching or hives, swelling of the face, lips, or tongue breathing problems changes in vision fast, irregular heartbeat high or low blood pressure mouth sores pain, tingling, numbness in the hands or feet signs of decreased platelets or bleeding - bruising, pinpoint red spots on the skin, black, tarry stools, blood in the urine signs of decreased red blood cells - unusually weak or tired, feeling faint or lightheaded, falls signs of infection - fever or chills, cough, sore throat, pain or difficulty passing urine signs and symptoms of liver injury like dark yellow or brown urine; general ill feeling or flu-like symptoms; light-colored stools; loss of appetite; nausea; right upper belly pain; unusually weak or tired; yellowing of the eyes or skin swelling of the ankles, feet, hands unusually slow heartbeat Side effects that usually do not require medical attention (report to your doctor or health care professional if they continue or are bothersome): diarrhea hair loss loss of appetite muscle or joint pain nausea, vomiting pain, redness, or irritation at site where injected tiredness This list may not describe all possible side effects. Call your doctor for medical advice about side effects. You may report side effects to FDA at 1-800-FDA-1088. Where should I keep my medication? This drug  is given in a hospital or clinic and will not be stored at home. NOTE: This sheet is a summary. It may not cover all possible information. If you have questions about this medicine, talk to your doctor, pharmacist, or health care provider.  2022 Elsevier/Gold Standard (2020-09-25 00:00:00) Trastuzumab injection for infusion What is this medication? TRASTUZUMAB (tras TOO zoo mab) is a monoclonal antibody. It is used to treat breast cancer and stomach cancer. This medicine may be used for other purposes; ask your health care provider or pharmacist if you have questions. COMMON BRAND NAME(S): [name], Janae Bridgeman, Ontruzant, Trazimera What should I tell my care team before I take this medication? They need to know if you have any of these conditions: heart disease heart failure lung or breathing disease, like asthma an unusual or allergic reaction to trastuzumab, benzyl alcohol, or other medications, foods, dyes, or preservatives pregnant or trying to get pregnant breast-feeding How should I use this medication? This drug is given as an infusion into a vein. It is administered in a hospital or clinic by a specially trained health care professional. Talk to your pediatrician regarding the use of this medicine in children. This medicine is not approved for use in children. Overdosage: If you think you have taken too much of  this medicine contact a poison control center or emergency room at once. NOTE: This medicine is only for you. Do not share this medicine with others. What if I miss a dose? It is important not to miss a dose. Call your doctor or health care professional if you are unable to keep an appointment. What may interact with this medication? This medicine may interact with the following medications: certain types of chemotherapy, such as daunorubicin, doxorubicin, epirubicin, and idarubicin This list may not describe all possible interactions. Give your health care  provider a list of all the medicines, herbs, non-prescription drugs, or dietary supplements you use. Also tell them if you smoke, drink alcohol, or use illegal drugs. Some items may interact with your medicine. What should I watch for while using this medication? Visit your doctor for checks on your progress. Report any side effects. Continue your course of treatment even though you feel ill unless your doctor tells you to stop. Call your doctor or health care professional for advice if you get a fever, chills or sore throat, or other symptoms of a cold or flu. Do not treat yourself. Try to avoid being around people who are sick. You may experience fever, chills and shaking during your first infusion. These effects are usually mild and can be treated with other medicines. Report any side effects during the infusion to your health care professional. Fever and chills usually do not happen with later infusions. Do not become pregnant while taking this medicine or for 7 months after stopping it. Women should inform their doctor if they wish to become pregnant or think they might be pregnant. Women of child-bearing potential will need to have a negative pregnancy test before starting this medicine. There is a potential for serious side effects to an unborn child. Talk to your health care professional or pharmacist for more information. Do not breast-feed an infant while taking this medicine or for 7 months after stopping it. Women must use effective birth control with this medicine. What side effects may I notice from receiving this medication? Side effects that you should report to your doctor or health care professional as soon as possible: allergic reactions like skin rash, itching or hives, swelling of the face, lips, or tongue chest pain or palpitations cough dizziness feeling faint or lightheaded, falls fever general ill feeling or flu-like symptoms signs of worsening heart failure like breathing  problems; swelling in your legs and feet unusually weak or tired Side effects that usually do not require medical attention (report to your doctor or health care professional if they continue or are bothersome): bone pain changes in taste diarrhea joint pain nausea/vomiting weight loss This list may not describe all possible side effects. Call your doctor for medical advice about side effects. You may report side effects to FDA at 1-800-FDA-1088. Where should I keep my medication? This drug is given in a hospital or clinic and will not be stored at home. NOTE: This sheet is a summary. It may not cover all possible information. If you have questions about this medicine, talk to your doctor, pharmacist, or health care provider.  2022 Elsevier/Gold Standard (2016-01-22 00:00:00)

## 2020-12-21 NOTE — Assessment & Plan Note (Addendum)
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  # Proceed with weekly Taxol-[name] #3 today. Labs today reviewed;  acceptable for treatment today.   # Nausea- no vomiting-improved with antiemetics-STABLE  # Diarrhea- G-1-2; immodium- STABLE  # Headaches/Migraines-on Fioricet/amlotriptan; and also Emgalti SQ.-STABLE  # Fibromyalgia- ok to proceed with  Injections/ortho.   # Rash on scalp: Secondary to Taxol.  S/p prednisone-however recommend Kenalog ointment; vaginal yeast infection/inframammary fungal dermatitis-recommend Diflucan as needed.  # DISPOSITION: # chemo today.  # 1 week- labs- cbc/cmp-Taxol-[name] #  2 weeks-  NP-; labs- cbc/cmp; taxol-[name] weekly- # 3 week- labs- cbc/cmp-Taxol-[name] #  4 weeks-  MD; labs- cbc/cmp; taxol-[name] weekly- Dr.B

## 2020-12-21 NOTE — Progress Notes (Signed)
After her last treatment she had rash on her scalp and was prescribed Prednisone that did help with the rash as well as the itching.    Also had a vaginal yeast infection as well as under both breast.  Took 1 dose of Diflucan she had at home and it improved.    Does not want to receive Compazine with treatment due to it makes her very tired.  She brought her oral Zofran from home to take.    She feels that since she was able to skip a week of treatment (due to holiday) her chemo brain and body aches had time to improve.

## 2020-12-21 NOTE — Progress Notes (Signed)
[facility] [place] NOTE  Patient Care Team: [name], MD as PCP - General (Family Medicine)  CHIEF COMPLAINTS/PURPOSE OF CONSULTATION: Breast cancer  #  Oncology History Overview Note   #Right breast-invasive mammary carcinoma 2 o'clock position #1.1 cm-ER/PR HER2/neu positive [3+]; SEP, 2022-MRI-up to 3 cm non-mass enhancement noted.  #Left breast- MRI #upper outer quadrant- 86m #retroareolar 1cm-. NEGATIVE for malignancy   EProcedure: Lumpectomy  Specimen Laterality: Right  Histologic Type: Invasive ductal carcinoma  Histologic Grade:       Glandular (Acinar)/Tubular Differentiation: 3       Nuclear Pleomorphism: 2       Mitotic Rate: 1       Overall Grade: 2  Tumor Size: 0.9 cm  Ductal Carcinoma In Situ: Present, intermediate grade  Treatment Effect in the Breast: No known presurgical therapy  Margins: All margins negative for invasive carcinoma       Distance from Closest Margin (mm): Cannot be determined  DCIS Margins: Uninvolved by DCIS       Distance from Closest Margin (mm): Cannot be determined  Regional Lymph Nodes:       Number of Lymph Nodes Examined: 5       Number of Sentinel Nodes Examined: 5       Number of Lymph Nodes with Macrometastases (>2 mm): 0       Number of Lymph Nodes with Micrometastases: 0       Number of Lymph Nodes with Isolated Tumor Cells (=0.2 mm or =200  cells): 0       Size of Largest Metastatic Deposit (mm): Not applicable       Extranodal Extension: Not applicable  Distant Metastasis:       Distant Site(s) Involved: Not applicable  Breast Biomarker Testing Performed on Previous Biopsy:       Testing Performed on Case Number: [id number]             Estrogen Receptor: 95%, positive, strong staining intensity             Progesterone Receptor: 95%, positive, strong staining  intensity             HER2: Positive (3+)             Ki-67: 15%  Pathologic Stage Classification (pTNM, AJCC 8th Edition): pT1b, pN0   Representative Tumor Block: A4  Comment(s): None   # NOV 4th, 2022- TAXOL-[name] q W  ---------------------------------   # Headaches-Migraine/ Anxiety [[name]; Dr.Yan  botox]  #Genetic testing-s/p genetic counseling-expanded panel negative.  #Reproductive counseling: DECLINED reproductive endocrinology.     Carcinoma of upper-inner quadrant of right breast in female, estrogen receptor positive ([facility]  09/21/2020 Initial Diagnosis   Malignant neoplasm of upper-inner quadrant of right female breast ([facility]   10/01/2020 Genetic Testing   Negative hereditary cancer genetic testing: no pathogenic variants detected in [name] and [name].  The report dates are October 01, 2020 and October 11, 2020, respectively.    The BRCAplus panel offered by [name] includes sequencing and deletion/duplication analysis for the following 8 genes: ATM, BRCA1, BRCA2, CDH1, CHEK2, PALB2, PTEN, and TP53.  The CustomNext-Cancer+RNAinsight panel offered by [employer] [employer] sequencing and rearrangement analysis for the following 47 genes:  APC, ATM, AXIN2, BARD1, BMPR1A, BRCA1, BRCA2, BRIP1, CDH1, CDK4, CDKN2A, CHEK2, DICER1, EPCAM, GREM1, HOXB13, MEN1, MLH1, MSH2, MSH3, MSH6, MUTYH, NBN, NF1, NF2, NTHL1, PALB2, PMS2, POLD1, POLE, PTEN, RAD51C, RAD51D, RECQL, RET, SDHA, SDHAF2,  SDHB, SDHC, SDHD, SMAD4, SMARCA4, STK11, TP53, TSC1, TSC2, and VHL.  RNA data is routinely analyzed for use in variant interpretation for all genes.   11/23/2020 -  Chemotherapy   Patient is on Treatment Plan : BREAST Paclitaxel + Trastuzumab q7d / Trastuzumab q21d        HISTORY OF PRESENTING ILLNESS: Ambulating independently.  Alone.  Alexandra Warren 40 y.o.  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  Patient after 3 weekly cycles noted to have-rash on her scalp s/p evaluation with  symptomatic med clinic.  S/p prednisone improved.  The rash was itchy.  Mild to moderate with nausea without any vomiting.  Improved with antiemetics.  Mild diarrhea post Taxol improved with Imodium.  Denies any significant tingling or numbness.  Complains of a yeast infection under the breasts/pelvic area.  Resolved with Diflucan.  Review of Systems  Constitutional:  Negative for chills, diaphoresis, fever, malaise/fatigue and weight loss.  HENT:  Negative for nosebleeds and sore throat.   Eyes:  Negative for double vision.  Respiratory:  Negative for cough, hemoptysis, sputum production, shortness of breath and wheezing.   Cardiovascular:  Negative for chest pain, palpitations, orthopnea and leg swelling.  Gastrointestinal:  Negative for abdominal pain, blood in stool, constipation, diarrhea, heartburn, melena, nausea and vomiting.  Genitourinary:  Negative for dysuria, frequency and urgency.  Musculoskeletal:  Positive for myalgias. Negative for back pain and joint pain.  Skin: Negative.  Negative for itching and rash.  Neurological:  Positive for headaches. Negative for dizziness, tingling, focal weakness and weakness.  Endo/Heme/Allergies:  Does not bruise/bleed easily.  Psychiatric/Behavioral:  Negative for depression. The patient is nervous/anxious and has insomnia.   All other systems reviewed and are negative.   MEDICAL HISTORY:  Past Medical History:  Diagnosis Date   Acid reflux    Anxiety and depression    Dr. Gala Murdoch, MD   Back pain    Family history of breast cancer 09/21/2020   Family history of malignant neoplasm of digestive organ 09/21/2020   Fibromyalgia    History of gallstones    Malignant neoplasm of upper-inner quadrant of right female breast ([place]) 09/21/2020   Migraine     SURGICAL HISTORY: Past Surgical History:  Procedure Laterality Date   BREAST LUMPECTOMY WITH RADIOACTIVE SEED LOCALIZATION Right 10/17/2020   Procedure: RIGHT BREAST LUMPECTOMY WITH  RADIOACTIVE SEED LOCALIZATION WITH RIGHT SENTINEL LYMPH NODE BIOPSY;  Surgeon: Stark Klein, MD;  Location: [facility];  Service: General;  Laterality: Right;   CHOLECYSTECTOMY  12/2004   PORTACATH PLACEMENT Left 10/17/2020   Procedure: INSERTION PORT-A-CATH;  Surgeon: Stark Klein, MD;  Location: [facility];  Service: General;  Laterality: Left;    SOCIAL HISTORY: Social History   Socioeconomic History   Marital status: Divorced    Spouse name: Corene Cornea   Number of children: 0   Years of education: MS   Highest education level: Not on file  Occupational History   Occupation: Optometrist  Tobacco Use   Smoking status: Never   Smokeless tobacco: Never  Vaping Use   Vaping Use: Never used  Substance and Sexual Activity   Alcohol use: Yes    Comment: pt states 1 glass of wine maybe once a month   Drug use: No   Sexual activity: Yes    Birth control/protection: Condom  Other Topics Concern   Not on file  Social History Narrative   Accountant  for commercial firm; wants short term disability;  never smoked; 2 drinks a month. Lives in [place] with boy friend [[place] No children. Has one brother    Social Determinants of Radio broadcast assistant Strain: Not on file  Food Insecurity: Not on file  Transportation Needs: Not on file  Physical Activity: Not on file  Stress: Not on file  Social Connections: Not on file  Intimate Partner Violence: Not on file    FAMILY HISTORY: Family History  Problem Relation Age of Onset   Healthy Mother    Hypertension Father    Skin cancer Brother        dx before 25   Colon cancer Maternal Aunt 50   Cervical cancer Maternal Aunt        dx before 78; x2   Cancer Paternal Grandmother 99       unknown primary; ? liver   Lung cancer Paternal Grandfather        dx after 25   Breast cancer Cousin 75       maternal cousin   Colon cancer Other        MGM's sister   Gastric cancer Other        PGM's sister   Leukemia Other         PMG's sister   Rectal cancer Neg Hx    Esophageal cancer Neg Hx     ALLERGIES:  is allergic to antihistamines, diphenhydramine-type; diphenhydramine hcl; promethazine; and pseudoeph-doxylamine-dm-apap.  MEDICATIONS:  Current Outpatient Medications  Medication Sig Dispense Refill   almotriptan (AXERT) 12.5 MG tablet Take 1 tab at onset of migraine.  May repeat in 2 hrs, if needed.  Max dose: 2 tabs/day. This is a 30 day prescription. 12 tablet 11   azelastine (ASTELIN) 0.1 % nasal spray Place 1 spray into both nostrils daily as needed for rhinitis.     BOTOX 200 units SOLR Inject 200 Units into the skin See admin instructions. Every 90 days     buPROPion (WELLBUTRIN XL) 300 MG 24 hr tablet Take 300 mg by mouth every morning.     butalbital-acetaminophen-caffeine (FIORICET) 50-325-40 MG tablet Take 1 tablet by mouth every 8 (eight) hours as needed for headache. 10 tablet 5   clonazePAM (KLONOPIN) 1 MG tablet Take 0.5-1 mg by mouth 2 (two) times daily as needed for anxiety.     EMGALITY 120 MG/ML SOAJ Inject 120 mg into the skin every 30 (thirty) days. 3 mL 4   fluconazole (DIFLUCAN) 100 MG tablet Take 1 tablet (100 mg total) by mouth daily. As needed for yeast infection. 6 tablet 0   omeprazole (PRILOSEC) 20 MG capsule Take 20 mg by mouth daily.     ondansetron (ZOFRAN) 8 MG tablet Take 1 tablet (8 mg total) by mouth every 8 (eight) hours as needed for nausea or vomiting. 30 tablet 3   OVER THE COUNTER MEDICATION Take 1 tablet by mouth daily. prevent a migraine     Rimegepant Sulfate (NURTEC) 75 MG TBDP Take 75 mg by mouth as needed (Take 1 at onset of headache, max is 75 mg in 24 hours). 8 tablet 11   rosuvastatin (CRESTOR) 20 MG tablet Take 20 mg by mouth daily.     tiZANidine (ZANAFLEX) 4 MG tablet Take 1 tablet (4 mg total) by mouth every 8 (eight) hours as needed for muscle spasms (migraines). This is a 30 day rx. 20 tablet 5   topiramate (TOPAMAX) 100  MG tablet Take 1 tablet (100 mg  total) by mouth 2 (two) times daily. (Patient taking differently: Take 200 mg by mouth at bedtime.) 60 tablet 11   traMADol (ULTRAM) 50 MG tablet Take 50 mg by mouth every 6 (six) hours as needed for pain.     triamcinolone ointment (KENALOG) 0.5 % Apply 1 application topically 2 (two) times daily. To scalp 30 g 1   vortioxetine HBr (TRINTELLIX) 20 MG TABS tablet Take 20 mg by mouth daily.  0   zolpidem (AMBIEN) 10 MG tablet Take 10 mg by mouth at bedtime as needed for sleep.     diazepam (VALIUM) 5 MG tablet One pill 45-60 mins prior to procedure; 1 pill 15 mins prior if needed. (Patient not taking: Reported on 12/07/2020) 6 tablet 0   lidocaine-prilocaine (EMLA) cream Apply 1 application topically as needed. 30 g 4   oxyCODONE (OXY IR/ROXICODONE) 5 MG immediate release tablet Take 1 tablet (5 mg total) by mouth every 6 (six) hours as needed for severe pain. (Patient not taking: Reported on 12/07/2020) 10 tablet 0   predniSONE (STERAPRED UNI-PAK 21 TAB) 10 MG (21) TBPK tablet Take 6 tablets (48m) x 1 day, then take 5 tablets (542m x 1 day, then take 4 tablets (4023mx 1 day, then take 3 tablets (28m82m 1 day, then take 2 tablets (20mg49m1 day, then take 1 tablet (10mg)85m day, then stop (Patient not taking: Reported on 12/21/2020) 21 tablet 0   No current facility-administered medications for this visit.   Facility-Administered Medications Ordered in Other Visits  Medication Dose Route Frequency Provider Last Rate Last Admin   dexamethasone (DECADRON) 10 mg in sodium chloride 0.9 % 50 mL IVPB  10 mg Intravenous Once BrahmaCammie Sickle     famotidine (PEPCID) IVPB 20 mg in NS 100 mL IVPB  20 mg Intravenous Once BrahmaCharlaine Dalton 400 mL/hr at 12/21/20 0949 20 mg at 12/21/20 0949   heparin lock flush 100 unit/mL  500 Units Intracatheter Once PRN BrahmaCammie Sickle     PACLitaxel (TAXOL) 156 mg in sodium chloride 0.9 % 250 mL chemo infusion (</= 80mg/m8m80 mg/m2  (Treatment Plan Recorded) Intravenous Once BrahmanCammie Sickle    trastuzumab-dkst ([name]) 168 mg in sodium chloride 0.9 % 250 mL chemo infusion  2 mg/kg (Treatment Plan Recorded) Intravenous Once BrahmanCammie Sickle    PHYSICAL EXAMINATION: ECOG PERFORMANCE STATUS: 0 - Asymptomatic  Vitals:   12/21/20 0845  BP: 116/78  Pulse: 81  Resp: 16  Temp: 98.9 F (37.2 C)  SpO2: 98%    Filed Weights   12/21/20 0845  Weight: 178 lb (80.7 kg)   Papular macular rash noted on the scalp.  Physical Exam Vitals and nursing note reviewed.  HENT:     Head: Normocephalic and atraumatic.     Mouth/Throat:     Pharynx: Oropharynx is clear.  Eyes:     Extraocular Movements: Extraocular movements intact.     Pupils: Pupils are equal, round, and reactive to light.  Cardiovascular:     Rate and Rhythm: Normal rate and regular rhythm.  Abdominal:     Palpations: Abdomen is soft.  Musculoskeletal:        General: Normal range of motion.     Cervical back: Normal range of motion.  Skin:    General: Skin is warm.  Neurological:  General: No focal deficit present.     Mental Status: She is alert and oriented to person, place, and time.  Psychiatric:        Behavior: Behavior normal.        Judgment: Judgment normal.     LABORATORY DATA:  I have reviewed the data as listed Lab Results  Component Value Date   WBC 12.3 (H) 12/21/2020   HGB 12.8 12/21/2020   HCT 38.2 12/21/2020   MCV 93.4 12/21/2020   PLT 352 12/21/2020   Recent Labs    11/30/20 0838 12/07/20 0838 12/21/20 0808  NA 140 137 136  K 3.8 3.4* 3.6  CL 110 106 105  CO2 23 21* 23  GLUCOSE 112* 130* 115*  BUN _0 CREATININE 0.77 0.63 0.80  CALCIUM 8.8* 8.5* 8.9  GFRNONAA >60 >60 >60  PROT 6.6 6.7 6.9  ALBUMIN 3.9 4.0 4.0  AST 34 33 26  ALT 51* 49* 46*  ALKPHOS 56 61 50  BILITOT 0.3 0.1* 0.5    RADIOGRAPHIC STUDIES: I have personally reviewed the radiological images as listed and  agreed with the findings in the report. No results found.  ASSESSMENT & PLAN:   Carcinoma of upper-inner quadrant of right breast in female, estrogen receptor positive ([place]) # STAGE-I- Right breast-invasive mammary carcinoma- pT1bN0 [77m]- ER/PR-Positive; Her-2 positive.Proceed with Taxol [name]-weekly x12 followed by [name] every 3 weeks for 12 months.OCT 2022-  MUGA scan- 58%.  # Proceed with weekly Taxol-[name] #3 today. Labs today reviewed;  acceptable for treatment today.   # Nausea- no vomiting-improved with antiemetics-STABLE  # Diarrhea- G-1-2; immodium- STABLE  # Headaches/Migraines-on Fioricet/amlotriptan; and also Emgalti SQ.-STABLE  # Fibromyalgia- ok to proceed with  Injections/ortho.   # Rash on scalp: Secondary to Taxol.  S/p prednisone-however recommend Kenalog ointment; vaginal yeast infection/inframammary fungal dermatitis-recommend Diflucan as needed.  # DISPOSITION: # chemo today.  # 1 week- labs- cbc/cmp-Taxol-[name] #  2 weeks-  NP-; labs- cbc/cmp; taxol-[name] weekly- # 3 week- labs- cbc/cmp-Taxol-[name] #  4 weeks-  MD; labs- cbc/cmp; taxol-[name] weekly- Dr.B    All questions were answered. The patient/family knows to call the clinic with any problems, questions or concerns.     GCammie Sickle MD 12/21/2020 9:54 AM

## 2020-12-24 ENCOUNTER — Encounter: Payer: Self-pay | Admitting: Internal Medicine

## 2020-12-28 ENCOUNTER — Inpatient Hospital Stay: Payer: PRIVATE HEALTH INSURANCE

## 2020-12-28 ENCOUNTER — Other Ambulatory Visit: Payer: Self-pay

## 2020-12-28 ENCOUNTER — Other Ambulatory Visit: Payer: Self-pay | Admitting: Internal Medicine

## 2020-12-28 VITALS — BP 118/79 | HR 81 | Temp 98.6°F | Resp 17 | Wt 178.2 lb

## 2020-12-28 DIAGNOSIS — Z5111 Encounter for antineoplastic chemotherapy: Secondary | ICD-10-CM | POA: Diagnosis not present

## 2020-12-28 DIAGNOSIS — C50211 Malignant neoplasm of upper-inner quadrant of right female breast: Secondary | ICD-10-CM

## 2020-12-28 DIAGNOSIS — Z170 Estrogen receptor positive status [ER+]: Secondary | ICD-10-CM

## 2020-12-28 LAB — CBC WITH DIFFERENTIAL/PLATELET
Abs Immature Granulocytes: 0.07 10*3/uL (ref 0.00–0.07)
Basophils Absolute: 0.1 10*3/uL (ref 0.0–0.1)
Basophils Relative: 1 %
Eosinophils Absolute: 0.5 10*3/uL (ref 0.0–0.5)
Eosinophils Relative: 5 %
HCT: 39.5 % (ref 36.0–46.0)
Hemoglobin: 12.9 g/dL (ref 12.0–15.0)
Immature Granulocytes: 1 %
Lymphocytes Relative: 41 %
Lymphs Abs: 3.8 10*3/uL (ref 0.7–4.0)
MCH: 31.6 pg (ref 26.0–34.0)
MCHC: 32.7 g/dL (ref 30.0–36.0)
MCV: 96.8 fL (ref 80.0–100.0)
Monocytes Absolute: 0.4 10*3/uL (ref 0.1–1.0)
Monocytes Relative: 5 %
Neutro Abs: 4.3 10*3/uL (ref 1.7–7.7)
Neutrophils Relative %: 47 %
Platelets: 349 10*3/uL (ref 150–400)
RBC: 4.08 MIL/uL (ref 3.87–5.11)
RDW: 13.4 % (ref 11.5–15.5)
WBC: 9.2 10*3/uL (ref 4.0–10.5)
nRBC: 0 % (ref 0.0–0.2)

## 2020-12-28 LAB — COMPREHENSIVE METABOLIC PANEL
ALT: 49 U/L — ABNORMAL HIGH (ref 0–44)
AST: 37 U/L (ref 15–41)
Albumin: 3.9 g/dL (ref 3.5–5.0)
Alkaline Phosphatase: 51 U/L (ref 38–126)
Anion gap: 10 (ref 5–15)
BUN: 12 mg/dL (ref 6–20)
CO2: 20 mmol/L — ABNORMAL LOW (ref 22–32)
Calcium: 8.8 mg/dL — ABNORMAL LOW (ref 8.9–10.3)
Chloride: 106 mmol/L (ref 98–111)
Creatinine, Ser: 0.86 mg/dL (ref 0.44–1.00)
GFR, Estimated: >60 mL/min (ref >60)
Glucose, Bld: 128 mg/dL — ABNORMAL HIGH (ref 70–99)
Potassium: 3.4 mmol/L — ABNORMAL LOW (ref 3.5–5.1)
Sodium: 136 mmol/L (ref 135–145)
Total Bilirubin: 0.2 mg/dL — ABNORMAL LOW (ref 0.3–1.2)
Total Protein: 6.8 g/dL (ref 6.5–8.1)

## 2020-12-28 LAB — PREGNANCY, URINE: Preg Test, Ur: NEGATIVE

## 2020-12-28 MED ORDER — SODIUM CHLORIDE 0.9 % IV SOLN
80.0000 mg/m2 | Freq: Once | INTRAVENOUS | Status: AC
  Administered 2020-12-28: 156 mg via INTRAVENOUS
  Filled 2020-12-28: qty 26

## 2020-12-28 MED ORDER — FAMOTIDINE 20 MG IN NS 100 ML IVPB
20.0000 mg | Freq: Once | INTRAVENOUS | Status: AC
  Administered 2020-12-28: 20 mg via INTRAVENOUS
  Filled 2020-12-28: qty 20

## 2020-12-28 MED ORDER — DIPHENHYDRAMINE HCL 50 MG/ML IJ SOLN
50.0000 mg | Freq: Once | INTRAMUSCULAR | Status: AC
  Administered 2020-12-28: 50 mg via INTRAVENOUS
  Filled 2020-12-28: qty 1

## 2020-12-28 MED ORDER — SODIUM CHLORIDE 0.9 % IV SOLN
Freq: Once | INTRAVENOUS | Status: AC
  Filled 2020-12-28: qty 250

## 2020-12-28 MED ORDER — TRASTUZUMAB-DKST CHEMO 150 MG IV SOLR
2.0000 mg/kg | Freq: Once | INTRAVENOUS | Status: AC
  Administered 2020-12-28: 168 mg via INTRAVENOUS
  Filled 2020-12-28: qty 8

## 2020-12-28 MED ORDER — HEPARIN SOD (PORK) LOCK FLUSH 100 UNIT/ML IV SOLN
INTRAVENOUS | Status: AC
  Administered 2020-12-28: 500 [IU]
  Filled 2020-12-28: qty 5

## 2020-12-28 MED ORDER — PROCHLORPERAZINE MALEATE 10 MG PO TABS: 10.0000 mg | ORAL_TABLET | Freq: Four times a day (QID) | ORAL | 1 refills | Status: DC | PRN

## 2020-12-28 MED ORDER — HEPARIN SOD (PORK) LOCK FLUSH 100 UNIT/ML IV SOLN
500.0000 [IU] | Freq: Once | INTRAVENOUS | Status: AC | PRN
  Filled 2020-12-28: qty 5

## 2020-12-28 MED ORDER — ACETAMINOPHEN 325 MG PO TABS
650.0000 mg | ORAL_TABLET | Freq: Once | ORAL | Status: AC
  Administered 2020-12-28: 650 mg via ORAL
  Filled 2020-12-28: qty 2

## 2020-12-28 MED ORDER — SODIUM CHLORIDE 0.9 % IV SOLN
10.0000 mg | Freq: Once | INTRAVENOUS | Status: AC
  Administered 2020-12-28: 10 mg via INTRAVENOUS
  Filled 2020-12-28: qty 10

## 2020-12-28 NOTE — Patient Instructions (Signed)
[facility] AT [place]  Discharge Instructions: Thank you for choosing [facility] to provide your oncology and hematology care.  If you have a lab appointment with the [facility], please go directly to the [facility] and check in at the registration area.  Wear comfortable clothing and clothing appropriate for easy access to any Portacath or PICC line.   We strive to give you quality time with your provider. You may need to reschedule your appointment if you arrive late (15 or more minutes).  Arriving late affects you and other patients whose appointments are after yours.  Also, if you miss three or more appointments without notifying the office, you may be dismissed from the clinic at the provider's discretion.      For prescription refill requests, have your pharmacy contact our office and allow 72 hours for refills to be completed.    Today you received the following chemotherapy and/or immunotherapy agents [name] and Taxol       To help prevent nausea and vomiting after your treatment, we encourage you to take your nausea medication as directed.  BELOW ARE SYMPTOMS THAT SHOULD BE REPORTED IMMEDIATELY: *FEVER GREATER THAN 100.4 F (38 C) OR HIGHER *CHILLS OR SWEATING *NAUSEA AND VOMITING THAT IS NOT CONTROLLED WITH YOUR NAUSEA MEDICATION *UNUSUAL SHORTNESS OF BREATH *UNUSUAL BRUISING OR BLEEDING *URINARY PROBLEMS (pain or burning when urinating, or frequent urination) *BOWEL PROBLEMS (unusual diarrhea, constipation, pain near the anus) TENDERNESS IN MOUTH AND THROAT WITH OR WITHOUT PRESENCE OF ULCERS (sore throat, sores in mouth, or a toothache) UNUSUAL RASH, SWELLING OR PAIN  UNUSUAL VAGINAL DISCHARGE OR ITCHING   Items with * indicate a potential emergency and should be followed up as soon as possible or go to the Emergency Department if any problems should occur.  Please show the CHEMOTHERAPY ALERT CARD or IMMUNOTHERAPY ALERT CARD at  check-in to the Emergency Department and triage nurse.  Should you have questions after your visit or need to cancel or reschedule your appointment, please contact [facility] [place]  [phone] and follow the prompts.  Office hours are 8:00 a.m. to 4:30 p.m. Monday - Friday. Please note that voicemails left after 4:00 p.m. may not be returned until the following business day.  We are closed weekends and major holidays. You have access to a nurse at all times for urgent questions. Please call the main number to the clinic [phone] and follow the prompts.  For any non-urgent questions, you may also contact your provider using MyChart. We now offer e-Visits for anyone 46 and older to request care online for non-urgent symptoms. For details visit [email or web address].   Also download the MyChart app! Go to the app store, search "MyChart", open the app, select [facility], and log in with your MyChart username and password.  Due to Covid, a mask is required upon entering the hospital/clinic. If you do not have a mask, one will be given to you upon arrival. For doctor visits, patients may have 1 support person aged 84 or older with them. For treatment visits, patients cannot have anyone with them due to current Covid guidelines and our immunocompromised population.

## 2020-12-31 ENCOUNTER — Encounter: Payer: Self-pay | Admitting: Internal Medicine

## 2021-01-01 ENCOUNTER — Other Ambulatory Visit: Payer: Self-pay

## 2021-01-01 ENCOUNTER — Inpatient Hospital Stay (HOSPITAL_BASED_OUTPATIENT_CLINIC_OR_DEPARTMENT_OTHER): Payer: PRIVATE HEALTH INSURANCE | Admitting: Hospice and Palliative Medicine

## 2021-01-01 DIAGNOSIS — R21 Rash and other nonspecific skin eruption: Secondary | ICD-10-CM | POA: Diagnosis not present

## 2021-01-01 DIAGNOSIS — C50211 Malignant neoplasm of upper-inner quadrant of right female breast: Secondary | ICD-10-CM | POA: Diagnosis not present

## 2021-01-01 DIAGNOSIS — Z170 Estrogen receptor positive status [ER+]: Secondary | ICD-10-CM | POA: Diagnosis not present

## 2021-01-01 MED ORDER — PREDNISONE 10 MG (21) PO TBPK: ORAL_TABLET | ORAL | 0 refills | Status: DC

## 2021-01-01 NOTE — Progress Notes (Signed)
Virtual Visit via Telephone Note  I connected with Alexandra Warren on 01/01/21 at  3:30 PM EST by a video enabled telemedicine application and verified that I am speaking with the correct person using two identifiers.  Location: Patient: Home Provider: Clinic   I discussed the limitations of evaluation and management by telemedicine and the availability of in person appointments. The patient expressed understanding and agreed to proceed.  History of Present Illness: Alexandra Warren is a 40 year old woman with stage I triple positive breast cancer status postlumpectomy now on systemic treatment with Taxol/[name] with history of pruritic scalp rash from chemotherapy   Observations/Objective: Patient was previously seen virtually by me 12/12/2020 for pruritic actiniform scalp rash thought secondary to chemotherapy.  She is started on prednisone taper and rash cleared.  Patient subsequently saw Dr. Rogue Bussing on 12/21/2020 who thought the rash was likely from Taxol.  He recommended Kenalog ointment.  Patient now reports that rash has recurred and that the Kenalog ointment is not helping.  She would like to again trial oral steroids.  Assessment and Plan: Rash -suspect recurrent drug eruption secondary to chemo.  We will restart prednisone taper but discussed the limitations for chronic oral steroids.  We will also proceed with referral to dermatology.  Patient would prefer [facility].  Follow Up Instructions: Patient to follow-up as previously scheduled with Rulon Abide, NP, on 01/04/2021 and then with Dr. Rogue Bussing on 12/30   I discussed the assessment and treatment plan with the patient. The patient was provided an opportunity to ask questions and all were answered. The patient agreed with the plan and demonstrated an understanding of the instructions.   The patient was advised to call back or seek an in-person evaluation if the symptoms worsen or if the condition fails to improve as  anticipated.  I provided 10 minutes of non-face-to-face time during this encounter.   Irean Hong, NP

## 2021-01-04 ENCOUNTER — Other Ambulatory Visit: Payer: Self-pay

## 2021-01-04 ENCOUNTER — Inpatient Hospital Stay: Payer: PRIVATE HEALTH INSURANCE

## 2021-01-04 ENCOUNTER — Inpatient Hospital Stay (HOSPITAL_BASED_OUTPATIENT_CLINIC_OR_DEPARTMENT_OTHER): Payer: PRIVATE HEALTH INSURANCE | Admitting: Oncology

## 2021-01-04 ENCOUNTER — Encounter: Payer: Self-pay | Admitting: Oncology

## 2021-01-04 VITALS — BP 120/80 | HR 89 | Temp 99.4°F | Resp 16 | Wt 179.8 lb

## 2021-01-04 DIAGNOSIS — C50211 Malignant neoplasm of upper-inner quadrant of right female breast: Secondary | ICD-10-CM | POA: Diagnosis not present

## 2021-01-04 DIAGNOSIS — Z170 Estrogen receptor positive status [ER+]: Secondary | ICD-10-CM

## 2021-01-04 DIAGNOSIS — R197 Diarrhea, unspecified: Secondary | ICD-10-CM | POA: Diagnosis not present

## 2021-01-04 DIAGNOSIS — R110 Nausea: Secondary | ICD-10-CM | POA: Diagnosis not present

## 2021-01-04 DIAGNOSIS — R21 Rash and other nonspecific skin eruption: Secondary | ICD-10-CM | POA: Diagnosis not present

## 2021-01-04 DIAGNOSIS — Z5111 Encounter for antineoplastic chemotherapy: Secondary | ICD-10-CM | POA: Diagnosis not present

## 2021-01-04 LAB — COMPREHENSIVE METABOLIC PANEL
ALT: 53 U/L — ABNORMAL HIGH (ref 0–44)
AST: 39 U/L (ref 15–41)
Albumin: 4.1 g/dL (ref 3.5–5.0)
Alkaline Phosphatase: 51 U/L (ref 38–126)
Anion gap: 11 (ref 5–15)
BUN: 15 mg/dL (ref 6–20)
CO2: 19 mmol/L — ABNORMAL LOW (ref 22–32)
Calcium: 8.8 mg/dL — ABNORMAL LOW (ref 8.9–10.3)
Chloride: 106 mmol/L (ref 98–111)
Creatinine, Ser: 0.76 mg/dL (ref 0.44–1.00)
GFR, Estimated: >60 mL/min (ref >60)
Glucose, Bld: 165 mg/dL — ABNORMAL HIGH (ref 70–99)
Potassium: 3.4 mmol/L — ABNORMAL LOW (ref 3.5–5.1)
Sodium: 136 mmol/L (ref 135–145)
Total Bilirubin: 0.6 mg/dL (ref 0.3–1.2)
Total Protein: 7.1 g/dL (ref 6.5–8.1)

## 2021-01-04 LAB — CBC WITH DIFFERENTIAL/PLATELET
Abs Immature Granulocytes: 0.09 10*3/uL — ABNORMAL HIGH (ref 0.00–0.07)
Basophils Absolute: 0 10*3/uL (ref 0.0–0.1)
Basophils Relative: 0 %
Eosinophils Absolute: 0 10*3/uL (ref 0.0–0.5)
Eosinophils Relative: 0 %
HCT: 39 % (ref 36.0–46.0)
Hemoglobin: 13 g/dL (ref 12.0–15.0)
Immature Granulocytes: 1 %
Lymphocytes Relative: 24 %
Lymphs Abs: 2.4 10*3/uL (ref 0.7–4.0)
MCH: 31.5 pg (ref 26.0–34.0)
MCHC: 33.3 g/dL (ref 30.0–36.0)
MCV: 94.4 fL (ref 80.0–100.0)
Monocytes Absolute: 0.5 10*3/uL (ref 0.1–1.0)
Monocytes Relative: 5 %
Neutro Abs: 6.9 10*3/uL (ref 1.7–7.7)
Neutrophils Relative %: 70 %
Platelets: 353 10*3/uL (ref 150–400)
RBC: 4.13 MIL/uL (ref 3.87–5.11)
RDW: 13.4 % (ref 11.5–15.5)
WBC: 9.9 10*3/uL (ref 4.0–10.5)
nRBC: 0 % (ref 0.0–0.2)

## 2021-01-04 LAB — PREGNANCY, URINE: Preg Test, Ur: NEGATIVE

## 2021-01-04 MED ORDER — PREDNISONE 10 MG PO TABS: 10.0000 mg | ORAL_TABLET | Freq: Every day | ORAL | 0 refills | Status: DC

## 2021-01-04 MED ORDER — SODIUM CHLORIDE 0.9 % IV SOLN
10.0000 mg | Freq: Once | INTRAVENOUS | Status: AC
  Administered 2021-01-04: 10 mg via INTRAVENOUS
  Filled 2021-01-04: qty 10

## 2021-01-04 MED ORDER — FAMOTIDINE 20 MG IN NS 100 ML IVPB
20.0000 mg | Freq: Once | INTRAVENOUS | Status: AC
  Administered 2021-01-04: 20 mg via INTRAVENOUS
  Filled 2021-01-04: qty 20

## 2021-01-04 MED ORDER — TRASTUZUMAB-DKST CHEMO 150 MG IV SOLR
2.0000 mg/kg | Freq: Once | INTRAVENOUS | Status: AC
  Administered 2021-01-04: 168 mg via INTRAVENOUS
  Filled 2021-01-04: qty 8

## 2021-01-04 MED ORDER — SODIUM CHLORIDE 0.9 % IV SOLN
Freq: Once | INTRAVENOUS | Status: AC
  Filled 2021-01-04: qty 250

## 2021-01-04 MED ORDER — DIPHENHYDRAMINE HCL 50 MG/ML IJ SOLN
50.0000 mg | Freq: Once | INTRAMUSCULAR | Status: AC
  Administered 2021-01-04: 50 mg via INTRAVENOUS
  Filled 2021-01-04: qty 1

## 2021-01-04 MED ORDER — HEPARIN SOD (PORK) LOCK FLUSH 100 UNIT/ML IV SOLN
500.0000 [IU] | Freq: Once | INTRAVENOUS | Status: AC | PRN
  Administered 2021-01-04: 500 [IU]
  Filled 2021-01-04: qty 5

## 2021-01-04 MED ORDER — ACETAMINOPHEN 325 MG PO TABS
650.0000 mg | ORAL_TABLET | Freq: Once | ORAL | Status: AC
  Administered 2021-01-04: 650 mg via ORAL
  Filled 2021-01-04: qty 2

## 2021-01-04 MED ORDER — MINOCYCLINE HCL 100 MG PO TABS: 100.0000 mg | ORAL_TABLET | Freq: Two times a day (BID) | ORAL | 1 refills | Status: DC

## 2021-01-04 MED ORDER — SODIUM CHLORIDE 0.9 % IV SOLN
80.0000 mg/m2 | Freq: Once | INTRAVENOUS | Status: AC
  Administered 2021-01-04: 156 mg via INTRAVENOUS
  Filled 2021-01-04: qty 26

## 2021-01-04 NOTE — Progress Notes (Signed)
Patient here for treatment check she reports on-going side effects with treatment which she is managing with medication and rest. Appetite improved.

## 2021-01-04 NOTE — Progress Notes (Signed)
[place] NOTE  Patient Care Team: [name], MD as PCP - General (Family Medicine)  CHIEF COMPLAINTS/PURPOSE OF CONSULTATION: Breast cancer  #  Oncology History Overview Note   #Right breast-invasive mammary carcinoma 2 o'clock position #1.1 cm-ER/PR HER2/neu positive [3+]; SEP, 2022-MRI-up to 3 cm non-mass enhancement noted.  #Left breast- MRI #upper outer quadrant- 13m #retroareolar 1cm-. NEGATIVE for malignancy   EProcedure: Lumpectomy  Specimen Laterality: Right  Histologic Type: Invasive ductal carcinoma  Histologic Grade:       Glandular (Acinar)/Tubular Differentiation: 3       Nuclear Pleomorphism: 2       Mitotic Rate: 1       Overall Grade: 2  Tumor Size: 0.9 cm  Ductal Carcinoma In Situ: Present, intermediate grade  Treatment Effect in the Breast: No known presurgical therapy  Margins: All margins negative for invasive carcinoma       Distance from Closest Margin (mm): Cannot be determined  DCIS Margins: Uninvolved by DCIS       Distance from Closest Margin (mm): Cannot be determined  Regional Lymph Nodes:       Number of Lymph Nodes Examined: 5       Number of Sentinel Nodes Examined: 5       Number of Lymph Nodes with Macrometastases (>2 mm): 0       Number of Lymph Nodes with Micrometastases: 0       Number of Lymph Nodes with Isolated Tumor Cells (=0.2 mm or =200  cells): 0       Size of Largest Metastatic Deposit (mm): Not applicable       Extranodal Extension: Not applicable  Distant Metastasis:       Distant Site(s) Involved: Not applicable  Breast Biomarker Testing Performed on Previous Biopsy:       Testing Performed on Case Number: [id number]             Estrogen Receptor: 95%, positive, strong staining intensity             Progesterone Receptor: 95%, positive, strong staining  intensity             HER2: Positive (3+)             Ki-67: 15%  Pathologic Stage Classification (pTNM, AJCC 8th Edition): pT1b, pN0   Representative Tumor Block: A4  Comment(s): None   # NOV 4th, 2022- TAXOL-[name] q W  ---------------------------------   # Headaches-Migraine/ Anxiety [[name]; Dr.Yan  botox]  #Genetic testing-s/p genetic counseling-expanded panel negative.  #Reproductive counseling: DECLINED reproductive endocrinology.     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  The report dates are October 01, 2020 and October 11, 2020, respectively.    The BRCAplus panel offered by [name] includes sequencing and deletion/duplication analysis for the following 8 genes: ATM, BRCA1, BRCA2, CDH1, CHEK2, PALB2, PTEN, and TP53.  The CustomNext-Cancer+RNAinsight panel offered by [employer] sequencing and rearrangement analysis for the following 47 genes:  APC, ATM, AXIN2, BARD1, BMPR1A, BRCA1, BRCA2, BRIP1, CDH1, CDK4, CDKN2A, CHEK2, DICER1, EPCAM, GREM1, HOXB13, MEN1, MLH1, MSH2, MSH3, MSH6, MUTYH, NBN, NF1, NF2, NTHL1, PALB2, PMS2, POLD1, POLE, PTEN, RAD51C, RAD51D, RECQL, RET, SDHA, SDHAF2,  SDHB, SDHC, SDHD, SMAD4, SMARCA4, STK11, TP53, TSC1, TSC2, and VHL.  RNA data is routinely analyzed for use in variant interpretation for all genes.   11/23/2020 -  Chemotherapy   Patient is on Treatment Plan : BREAST Paclitaxel + Trastuzumab q7d / Trastuzumab q21d        HISTORY OF PRESENTING ILLNESS: Mrs. Dwight is a 40 year old female with above history currently being treated by Dr. 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  Appears to be tolerating treatment fairly well.  Has  been treated with 2 rounds of a prednisone taper for an acneform rash on her scalp thought to be secondary to chemotherapy.  Developed mild nausea and diarrhea which was managed with antiemetics and Imodium.  Treated with Diflucan for yeast infection under breast.  Rash has improved with steroid taper.  Patient states that rash is almost immediately resolved after beginning steroids.  She is currently on her fourth day of her taper.  Reports nausea improved with Zofran and Compazine and she has been using the Imodium.  Appetite has improved.  Was given several doses of Diflucan for which she uses for recurrent yeast infections post chemo.  Has anxiety of recurrence of her rash once her steroid taper is complete.   Review of Systems  Constitutional:  Positive for malaise/fatigue.  Gastrointestinal:  Positive for diarrhea, nausea and vomiting.  Skin:  Positive for itching and rash.  Psychiatric/Behavioral:  The patient is nervous/anxious.     MEDICAL HISTORY:  Past Medical History:  Diagnosis Date   Acid reflux    Anxiety and depression    Dr. Gala Murdoch, MD   Back pain    Family history of breast cancer 09/21/2020   Family history of malignant neoplasm of digestive organ 09/21/2020   Fibromyalgia    History of gallstones    Malignant neoplasm of upper-inner quadrant of right female breast ([place]) 09/21/2020   Migraine     SURGICAL HISTORY: Past Surgical History:  Procedure Laterality Date   BREAST LUMPECTOMY WITH RADIOACTIVE SEED LOCALIZATION Right 10/17/2020   Procedure: RIGHT BREAST LUMPECTOMY WITH RADIOACTIVE SEED LOCALIZATION WITH RIGHT SENTINEL LYMPH NODE BIOPSY;  Surgeon: Stark Klein, MD;  Location: [facility];  Service: General;  Laterality: Right;   CHOLECYSTECTOMY  12/2004   PORTACATH PLACEMENT Left 10/17/2020   Procedure: INSERTION PORT-A-CATH;  Surgeon: Stark Klein, MD;  Location: [facility];  Service: General;  Laterality: Left;    SOCIAL HISTORY: Social History   Socioeconomic  History   Marital status: Divorced    Spouse name: Corene Cornea   Number of children: 0   Years of education: MS   Highest education level: Not on file  Occupational History   Occupation: Optometrist  Tobacco Use   Smoking status: Never   Smokeless tobacco: Never  Vaping Use   Vaping Use: Never used  Substance and Sexual Activity   Alcohol use: Yes    Comment: pt states 1 glass of wine maybe once a month   Drug use: No   Sexual activity: Yes    Birth control/protection: Condom  Other Topics Concern   Not on file  Social History Narrative   Accountant for commercial firm; wants short term disability;  never smoked; 2 drinks a month. Lives in [place] with boy friend [[place] No children. Has one brother    Social Determinants of Radio broadcast assistant Strain: Not on file  Food Insecurity: Not on file  Transportation Needs: Not on file  Physical Activity: Not on file  Stress: Not on file  Social Connections: Not on file  Intimate Partner Violence: Not on file    FAMILY HISTORY: Family History  Problem Relation Age of Onset   Healthy Mother    Hypertension Father    Skin cancer Brother        dx before 66   Colon cancer Maternal Aunt 36   Cervical cancer Maternal Aunt        dx before 35; x2   Cancer Paternal Grandmother 99       unknown primary; ? liver   Lung cancer Paternal Grandfather        dx after 82   Breast cancer Cousin 55       maternal cousin   Colon cancer Other        MGM's sister   Gastric cancer Other        PGM's sister   Leukemia Other        PMG's sister   Rectal cancer Neg Hx    Esophageal cancer Neg Hx     ALLERGIES:  is allergic to antihistamines, diphenhydramine-type; diphenhydramine hcl; promethazine; and pseudoeph-doxylamine-dm-apap.  MEDICATIONS:  Current Outpatient Medications  Medication Sig Dispense Refill   almotriptan (AXERT) 12.5 MG tablet Take 1 tab at onset of migraine.  May repeat in 2 hrs, if needed.   Max dose: 2 tabs/day. This is a 30 day prescription. 12 tablet 11   azelastine (ASTELIN) 0.1 % nasal spray Place 1 spray into both nostrils daily as needed for rhinitis.     BOTOX 200 units SOLR Inject 200 Units into the skin See admin instructions. Every 90 days     buPROPion (WELLBUTRIN XL) 300 MG 24 hr tablet Take 300 mg by mouth every morning.     butalbital-acetaminophen-caffeine (FIORICET) 50-325-40 MG tablet Take 1 tablet by mouth every 8 (eight) hours as needed for headache. 10 tablet 5   clonazePAM (KLONOPIN) 1 MG tablet Take 0.5-1 mg by mouth 2 (two) times daily as needed for anxiety.     diazepam (VALIUM) 5 MG tablet One pill 45-60 mins prior to procedure; 1 pill 15 mins prior if needed. (Patient not taking: Reported on 12/07/2020) 6 tablet 0   EMGALITY 120 MG/ML SOAJ Inject 120 mg into the skin every 30 (thirty) days. 3 mL 4   fluconazole (DIFLUCAN) 100 MG tablet Take 1 tablet (100 mg total) by mouth daily. As needed for yeast infection. 6 tablet 0   lidocaine-prilocaine (EMLA) cream Apply 1 application topically as needed. 30 g 4   omeprazole (PRILOSEC) 20 MG capsule Take 20 mg by mouth daily.     ondansetron (ZOFRAN) 8 MG tablet Take 1 tablet (8 mg total) by mouth every 8 (eight) hours as needed for nausea or vomiting. 30 tablet 3   OVER THE COUNTER MEDICATION Take 1 tablet by mouth daily. prevent a migraine     oxyCODONE (OXY IR/ROXICODONE) 5 MG immediate release tablet Take 1 tablet (5 mg total) by mouth every 6 (six) hours as needed for severe pain. (Patient not taking: Reported on 12/07/2020) 10 tablet 0   predniSONE (STERAPRED UNI-PAK 21 TAB) 10 MG (21) TBPK tablet Take 6 tablets ($RemoveBe'60mg'tNelzyLIc$ ) x 1 day, then take 5 tablets ($RemoveBe'50mg'pogEYvkhN$ ) x 1 day, then take 4 tablets ($RemoveBe'40mg'yIIrPJrbd$ ) x 1 day, then take 3 tablets ($RemoveBe'30mg'RCGGlxZGP$ ) x 1 day, then take 2 tablets ($RemoveBe'20mg'tiDYxkloC$ ) x 1 day,  then take 1 tablet (10mg ) x 1 day, then stop 21 tablet 0   prochlorperazine (COMPAZINE) 10 MG tablet Take 1 tablet (10 mg total) by mouth every 6  (six) hours as needed for nausea or vomiting. 40 tablet 1   Rimegepant Sulfate (NURTEC) 75 MG TBDP Take 75 mg by mouth as needed (Take 1 at onset of headache, max is 75 mg in 24 hours). 8 tablet 11   rosuvastatin (CRESTOR) 20 MG tablet Take 20 mg by mouth daily.     tiZANidine (ZANAFLEX) 4 MG tablet Take 1 tablet (4 mg total) by mouth every 8 (eight) hours as needed for muscle spasms (migraines). This is a 30 day rx. 20 tablet 5   topiramate (TOPAMAX) 100 MG tablet Take 1 tablet (100 mg total) by mouth 2 (two) times daily. (Patient taking differently: Take 200 mg by mouth at bedtime.) 60 tablet 11   traMADol (ULTRAM) 50 MG tablet Take 50 mg by mouth every 6 (six) hours as needed for pain.     triamcinolone ointment (KENALOG) 0.5 % Apply 1 application topically 2 (two) times daily. To scalp 30 g 1   vortioxetine HBr (TRINTELLIX) 20 MG TABS tablet Take 20 mg by mouth daily.  0   zolpidem (AMBIEN) 10 MG tablet Take 10 mg by mouth at bedtime as needed for sleep.     No current facility-administered medications for this visit.   PHYSICAL EXAMINATION: ECOG PERFORMANCE STATUS: 0 - Asymptomatic  There were no vitals filed for this visit.   There were no vitals filed for this visit.  Papular macular rash noted on the scalp.  Physical Exam Constitutional:      Appearance: Normal appearance.  HENT:     Head: Normocephalic and atraumatic.  Eyes:     Pupils: Pupils are equal, round, and reactive to light.  Cardiovascular:     Rate and Rhythm: Normal rate and regular rhythm.     Heart sounds: Normal heart sounds. No murmur heard. Pulmonary:     Effort: Pulmonary effort is normal.     Breath sounds: Normal breath sounds. No wheezing.  Abdominal:     General: Bowel sounds are normal. There is no distension.     Palpations: Abdomen is soft.     Tenderness: There is no abdominal tenderness.  Musculoskeletal:        General: Normal range of motion.     Cervical back: Normal range of motion.   Skin:    General: Skin is warm and dry.     Findings: No rash.  Neurological:     Mental Status: She is alert and oriented to person, place, and time.     Gait: Gait is intact.  Psychiatric:        Mood and Affect: Mood and affect normal.        Cognition and Memory: Memory normal.        Judgment: Judgment normal.     LABORATORY DATA:  I have reviewed the data as listed Lab Results  Component Value Date   WBC 9.2 12/28/2020   HGB 12.9 12/28/2020   HCT 39.5 12/28/2020   MCV 96.8 12/28/2020   PLT 349 12/28/2020   Recent Labs    12/07/20 0838 12/21/20 0808 12/28/20 0811  NA 137 136 136  K 3.4* 3.6 3.4*  CL 106 105 106  CO2 21* 23 20*  GLUCOSE 130* 115* 128*  BUN 11 14 12   CREATININE 0.63 0.80 0.86  CALCIUM 8.5* 8.9  8.8*  GFRNONAA >60 >60 >60  PROT 6.7 6.9 6.8  ALBUMIN 4.0 4.0 3.9  AST 33 26 37  ALT 49* 46* 49*  ALKPHOS 61 50 51  BILITOT 0.1* 0.5 0.2*     RADIOGRAPHIC STUDIES: I have personally reviewed the radiological images as listed and agreed with the findings in the report. No results found.  ASSESSMENT & PLAN: Mrs. Vandehei is a 40 year old female who is here for treatment assessment prior to cycle 2-day 8 [name] and Taxol.  Clinically she is doing well.  Reviewed labs and they are adequate for treatment.  Proceed with cycle 2-day 8 [name] and Taxol.  Patient would like to skip next week to treatment and return back on 01/18/2021.  Acneform rash- Improved with steroid taper only.   She is currently on her fourth day and has anxiety about recurrence of the rash after treatment.  Will prescribe additional prednisone 10 mg for worsening rash next week.  Discussed the use of minocycline which is an antibiotic that helps with acneform rash and is better for maintenance then steroids.    Diarrhea-continue Imodium as needed.  Nausea-continue Compazine and Zofran.  Disposition- Per patient request we will cancel her appointment for next week. RTC on  01/18/2021 for follow-up with lab work and to see Dr. Rogue Bussing for next cycle of chemotherapy. Rx minocycline 100 mg twice per day sent to pharmacy.  Additional 10 mg prednisone tablets sent for backup if needed for worsening rash posttreatment.  This is to be used sparingly and if minocycline is not helping.  I spent 25 minutes dedicated to the care of this patient (face-to-face and non-face-to-face) on the date of the encounter to include what is described in the assessment and plan.  No problem-specific Assessment & Plan notes found for this encounter.   All questions were answered. The patient/family knows to call the clinic with any problems, questions or concerns.     Jacquelin Hawking, NP 01/04/2021 8:31 AM

## 2021-01-04 NOTE — Patient Instructions (Signed)
[facility] AT [place]  Discharge Instructions: Thank you for choosing [facility] to provide your oncology and hematology care.  If you have a lab appointment with the [facility], please go directly to the [facility] and check in at the registration area.  Wear comfortable clothing and clothing appropriate for easy access to any Portacath or PICC line.   We strive to give you quality time with your provider. You may need to reschedule your appointment if you arrive late (15 or more minutes).  Arriving late affects you and other patients whose appointments are after yours.  Also, if you miss three or more appointments without notifying the office, you may be dismissed from the clinic at the providers discretion.      For prescription refill requests, have your pharmacy contact our office and allow 72 hours for refills to be completed.    Today you received the following chemotherapy and/or immunotherapy agents: [name], Taxol      To help prevent nausea and vomiting after your treatment, we encourage you to take your nausea medication as directed.  BELOW ARE SYMPTOMS THAT SHOULD BE REPORTED IMMEDIATELY: *FEVER GREATER THAN 100.4 F (38 C) OR HIGHER *CHILLS OR SWEATING *NAUSEA AND VOMITING THAT IS NOT CONTROLLED WITH YOUR NAUSEA MEDICATION *UNUSUAL SHORTNESS OF BREATH *UNUSUAL BRUISING OR BLEEDING *URINARY PROBLEMS (pain or burning when urinating, or frequent urination) *BOWEL PROBLEMS (unusual diarrhea, constipation, pain near the anus) TENDERNESS IN MOUTH AND THROAT WITH OR WITHOUT PRESENCE OF ULCERS (sore throat, sores in mouth, or a toothache) UNUSUAL RASH, SWELLING OR PAIN  UNUSUAL VAGINAL DISCHARGE OR ITCHING   Items with * indicate a potential emergency and should be followed up as soon as possible or go to the Emergency Department if any problems should occur.  Please show the CHEMOTHERAPY ALERT CARD or IMMUNOTHERAPY ALERT CARD at check-in  to the Emergency Department and triage nurse.  Should you have questions after your visit or need to cancel or reschedule your appointment, please contact [facility] [place] AT [place]  [phone] and follow the prompts.  Office hours are 8:00 a.m. to 4:30 p.m. Monday - Friday. Please note that voicemails left after 4:00 p.m. may not be returned until the following business day.  We are closed weekends and major holidays. You have access to a nurse at all times for urgent questions. Please call the main number to the clinic [phone] and follow the prompts.  For any non-urgent questions, you may also contact your provider using MyChart. We now offer e-Visits for anyone 42 and older to request care online for non-urgent symptoms. For details visit [email or web address].   Also download the MyChart app! Go to the app store, search "MyChart", open the app, select [facility], and log in with your MyChart username and password.  Due to Covid, a mask is required upon entering the hospital/clinic. If you do not have a mask, one will be given to you upon arrival. For doctor visits, patients may have 1 support person aged 45 or older with them. For treatment visits, patients cannot have anyone with them due to current Covid guidelines and our immunocompromised population. Paclitaxel injection What is this medication? PACLITAXEL (PAK li TAX el) is a chemotherapy drug. It targets fast dividing cells, like cancer cells, and causes these cells to die. This medicine is used to treat ovarian cancer, breast cancer, lung cancer, Kaposi's sarcoma, and other cancers. This medicine may be used for other purposes; ask your  health care provider or pharmacist if you have questions. COMMON BRAND NAME(S): Onxol, Taxol What should I tell my care team before I take this medication? They need to know if you have any of these conditions: history of irregular heartbeat liver disease low blood counts, like  low white cell, platelet, or red cell counts lung or breathing disease, like asthma tingling of the fingers or toes, or other nerve disorder an unusual or allergic reaction to paclitaxel, alcohol, polyoxyethylated castor oil, other chemotherapy, other medicines, foods, dyes, or preservatives pregnant or trying to get pregnant breast-feeding How should I use this medication? This drug is given as an infusion into a vein. It is administered in a hospital or clinic by a specially trained health care professional. Talk to your pediatrician regarding the use of this medicine in children. Special care may be needed. Overdosage: If you think you have taken too much of this medicine contact a poison control center or emergency room at once. NOTE: This medicine is only for you. Do not share this medicine with others. What if I miss a dose? It is important not to miss your dose. Call your doctor or health care professional if you are unable to keep an appointment. What may interact with this medication? Do not take this medicine with any of the following medications: live virus vaccines This medicine may also interact with the following medications: antiviral medicines for hepatitis, HIV or AIDS certain antibiotics like erythromycin and clarithromycin certain medicines for fungal infections like ketoconazole and itraconazole certain medicines for seizures like carbamazepine, phenobarbital, phenytoin gemfibrozil nefazodone rifampin St. John's wort This list may not describe all possible interactions. Give your health care provider a list of all the medicines, herbs, non-prescription drugs, or dietary supplements you use. Also tell them if you smoke, drink alcohol, or use illegal drugs. Some items may interact with your medicine. What should I watch for while using this medication? Your condition will be monitored carefully while you are receiving this medicine. You will need important blood work done  while you are taking this medicine. This medicine can cause serious allergic reactions. To reduce your risk you will need to take other medicine(s) before treatment with this medicine. If you experience allergic reactions like skin rash, itching or hives, swelling of the face, lips, or tongue, tell your doctor or health care professional right away. In some cases, you may be given additional medicines to help with side effects. Follow all directions for their use. This drug may make you feel generally unwell. This is not uncommon, as chemotherapy can affect healthy cells as well as cancer cells. Report any side effects. Continue your course of treatment even though you feel ill unless your doctor tells you to stop. Call your doctor or health care professional for advice if you get a fever, chills or sore throat, or other symptoms of a cold or flu. Do not treat yourself. This drug decreases your body's ability to fight infections. Try to avoid being around people who are sick. This medicine may increase your risk to bruise or bleed. Call your doctor or health care professional if you notice any unusual bleeding. Be careful brushing and flossing your teeth or using a toothpick because you may get an infection or bleed more easily. If you have any dental work done, tell your dentist you are receiving this medicine. Avoid taking products that contain aspirin, acetaminophen, ibuprofen, naproxen, or ketoprofen unless instructed by your doctor. These medicines may hide a fever.  Do not become pregnant while taking this medicine. Women should inform their doctor if they wish to become pregnant or think they might be pregnant. There is a potential for serious side effects to an unborn child. Talk to your health care professional or pharmacist for more information. Do not breast-feed an infant while taking this medicine. Men are advised not to father a child while receiving this medicine. This product may contain  alcohol. Ask your pharmacist or healthcare provider if this medicine contains alcohol. Be sure to tell all healthcare providers you are taking this medicine. Certain medicines, like metronidazole and disulfiram, can cause an unpleasant reaction when taken with alcohol. The reaction includes flushing, headache, nausea, vomiting, sweating, and increased thirst. The reaction can last from 30 minutes to several hours. What side effects may I notice from receiving this medication? Side effects that you should report to your doctor or health care professional as soon as possible: allergic reactions like skin rash, itching or hives, swelling of the face, lips, or tongue breathing problems changes in vision fast, irregular heartbeat high or low blood pressure mouth sores pain, tingling, numbness in the hands or feet signs of decreased platelets or bleeding - bruising, pinpoint red spots on the skin, black, tarry stools, blood in the urine signs of decreased red blood cells - unusually weak or tired, feeling faint or lightheaded, falls signs of infection - fever or chills, cough, sore throat, pain or difficulty passing urine signs and symptoms of liver injury like dark yellow or brown urine; general ill feeling or flu-like symptoms; light-colored stools; loss of appetite; nausea; right upper belly pain; unusually weak or tired; yellowing of the eyes or skin swelling of the ankles, feet, hands unusually slow heartbeat Side effects that usually do not require medical attention (report to your doctor or health care professional if they continue or are bothersome): diarrhea hair loss loss of appetite muscle or joint pain nausea, vomiting pain, redness, or irritation at site where injected tiredness This list may not describe all possible side effects. Call your doctor for medical advice about side effects. You may report side effects to FDA at 1-800-FDA-1088. Where should I keep my medication? This drug  is given in a hospital or clinic and will not be stored at home. NOTE: This sheet is a summary. It may not cover all possible information. If you have questions about this medicine, talk to your doctor, pharmacist, or health care provider.  2022 Elsevier/Gold Standard (2020-09-25 00:00:00) Trastuzumab injection for infusion What is this medication? TRASTUZUMAB (tras TOO zoo mab) is a monoclonal antibody. It is used to treat breast cancer and stomach cancer. This medicine may be used for other purposes; ask your health care provider or pharmacist if you have questions. COMMON BRAND NAME(S): [name], Janae Bridgeman, Ontruzant, Trazimera What should I tell my care team before I take this medication? They need to know if you have any of these conditions: heart disease heart failure lung or breathing disease, like asthma an unusual or allergic reaction to trastuzumab, benzyl alcohol, or other medications, foods, dyes, or preservatives pregnant or trying to get pregnant breast-feeding How should I use this medication? This drug is given as an infusion into a vein. It is administered in a hospital or clinic by a specially trained health care professional. Talk to your pediatrician regarding the use of this medicine in children. This medicine is not approved for use in children. Overdosage: If you think you have taken too much of  this medicine contact a poison control center or emergency room at once. NOTE: This medicine is only for you. Do not share this medicine with others. What if I miss a dose? It is important not to miss a dose. Call your doctor or health care professional if you are unable to keep an appointment. What may interact with this medication? This medicine may interact with the following medications: certain types of chemotherapy, such as daunorubicin, doxorubicin, epirubicin, and idarubicin This list may not describe all possible interactions. Give your health care  provider a list of all the medicines, herbs, non-prescription drugs, or dietary supplements you use. Also tell them if you smoke, drink alcohol, or use illegal drugs. Some items may interact with your medicine. What should I watch for while using this medication? Visit your doctor for checks on your progress. Report any side effects. Continue your course of treatment even though you feel ill unless your doctor tells you to stop. Call your doctor or health care professional for advice if you get a fever, chills or sore throat, or other symptoms of a cold or flu. Do not treat yourself. Try to avoid being around people who are sick. You may experience fever, chills and shaking during your first infusion. These effects are usually mild and can be treated with other medicines. Report any side effects during the infusion to your health care professional. Fever and chills usually do not happen with later infusions. Do not become pregnant while taking this medicine or for 7 months after stopping it. Women should inform their doctor if they wish to become pregnant or think they might be pregnant. Women of child-bearing potential will need to have a negative pregnancy test before starting this medicine. There is a potential for serious side effects to an unborn child. Talk to your health care professional or pharmacist for more information. Do not breast-feed an infant while taking this medicine or for 7 months after stopping it. Women must use effective birth control with this medicine. What side effects may I notice from receiving this medication? Side effects that you should report to your doctor or health care professional as soon as possible: allergic reactions like skin rash, itching or hives, swelling of the face, lips, or tongue chest pain or palpitations cough dizziness feeling faint or lightheaded, falls fever general ill feeling or flu-like symptoms signs of worsening heart failure like breathing  problems; swelling in your legs and feet unusually weak or tired Side effects that usually do not require medical attention (report to your doctor or health care professional if they continue or are bothersome): bone pain changes in taste diarrhea joint pain nausea/vomiting weight loss This list may not describe all possible side effects. Call your doctor for medical advice about side effects. You may report side effects to FDA at 1-800-FDA-1088. Where should I keep my medication? This drug is given in a hospital or clinic and will not be stored at home. NOTE: This sheet is a summary. It may not cover all possible information. If you have questions about this medicine, talk to your doctor, pharmacist, or health care provider.  2022 Elsevier/Gold Standard (2016-01-22 00:00:00)

## 2021-01-11 ENCOUNTER — Other Ambulatory Visit: Payer: No Typology Code available for payment source

## 2021-01-11 ENCOUNTER — Encounter: Payer: Self-pay | Admitting: Internal Medicine

## 2021-01-11 ENCOUNTER — Ambulatory Visit: Payer: No Typology Code available for payment source

## 2021-01-18 ENCOUNTER — Encounter: Payer: Self-pay | Admitting: Internal Medicine

## 2021-01-18 ENCOUNTER — Other Ambulatory Visit: Payer: Self-pay

## 2021-01-18 ENCOUNTER — Inpatient Hospital Stay: Payer: PRIVATE HEALTH INSURANCE

## 2021-01-18 ENCOUNTER — Inpatient Hospital Stay (HOSPITAL_BASED_OUTPATIENT_CLINIC_OR_DEPARTMENT_OTHER): Payer: PRIVATE HEALTH INSURANCE | Admitting: Internal Medicine

## 2021-01-18 DIAGNOSIS — Z170 Estrogen receptor positive status [ER+]: Secondary | ICD-10-CM

## 2021-01-18 DIAGNOSIS — C50211 Malignant neoplasm of upper-inner quadrant of right female breast: Secondary | ICD-10-CM

## 2021-01-18 DIAGNOSIS — Z5111 Encounter for antineoplastic chemotherapy: Secondary | ICD-10-CM | POA: Diagnosis not present

## 2021-01-18 LAB — CBC WITH DIFFERENTIAL/PLATELET
Abs Immature Granulocytes: 0.15 10*3/uL — ABNORMAL HIGH (ref 0.00–0.07)
Basophils Absolute: 0.1 10*3/uL (ref 0.0–0.1)
Basophils Relative: 1 %
Eosinophils Absolute: 0.2 10*3/uL (ref 0.0–0.5)
Eosinophils Relative: 3 %
HCT: 41.7 % (ref 36.0–46.0)
Hemoglobin: 13.5 g/dL (ref 12.0–15.0)
Immature Granulocytes: 2 %
Lymphocytes Relative: 39 %
Lymphs Abs: 3.7 10*3/uL (ref 0.7–4.0)
MCH: 31.6 pg (ref 26.0–34.0)
MCHC: 32.4 g/dL (ref 30.0–36.0)
MCV: 97.7 fL (ref 80.0–100.0)
Monocytes Absolute: 0.8 10*3/uL (ref 0.1–1.0)
Monocytes Relative: 9 %
Neutro Abs: 4.4 10*3/uL (ref 1.7–7.7)
Neutrophils Relative %: 46 %
Platelets: 317 10*3/uL (ref 150–400)
RBC: 4.27 MIL/uL (ref 3.87–5.11)
RDW: 13.8 % (ref 11.5–15.5)
WBC: 9.4 10*3/uL (ref 4.0–10.5)
nRBC: 0 % (ref 0.0–0.2)

## 2021-01-18 LAB — COMPREHENSIVE METABOLIC PANEL
ALT: 63 U/L — ABNORMAL HIGH (ref 0–44)
AST: 33 U/L (ref 15–41)
Albumin: 4 g/dL (ref 3.5–5.0)
Alkaline Phosphatase: 47 U/L (ref 38–126)
Anion gap: 10 (ref 5–15)
BUN: 16 mg/dL (ref 6–20)
CO2: 21 mmol/L — ABNORMAL LOW (ref 22–32)
Calcium: 8.7 mg/dL — ABNORMAL LOW (ref 8.9–10.3)
Chloride: 106 mmol/L (ref 98–111)
Creatinine, Ser: 0.87 mg/dL (ref 0.44–1.00)
GFR, Estimated: >60 mL/min (ref >60)
Glucose, Bld: 113 mg/dL — ABNORMAL HIGH (ref 70–99)
Potassium: 3.4 mmol/L — ABNORMAL LOW (ref 3.5–5.1)
Sodium: 137 mmol/L (ref 135–145)
Total Bilirubin: 0.2 mg/dL — ABNORMAL LOW (ref 0.3–1.2)
Total Protein: 6.8 g/dL (ref 6.5–8.1)

## 2021-01-18 LAB — PREGNANCY, URINE: Preg Test, Ur: NEGATIVE

## 2021-01-18 MED ORDER — ACETAMINOPHEN 325 MG PO TABS
650.0000 mg | ORAL_TABLET | Freq: Once | ORAL | Status: AC
  Administered 2021-01-18: 09:00:00 650 mg via ORAL
  Filled 2021-01-18: qty 2

## 2021-01-18 MED ORDER — FAMOTIDINE 20 MG IN NS 100 ML IVPB: 20.0000 mg | Freq: Once | INTRAVENOUS | Status: DC

## 2021-01-18 MED ORDER — SODIUM CHLORIDE 0.9 % IV SOLN
10.0000 mg | Freq: Once | INTRAVENOUS | Status: AC
  Administered 2021-01-18: 09:00:00 10 mg via INTRAVENOUS
  Filled 2021-01-18: qty 1

## 2021-01-18 MED ORDER — HEPARIN SOD (PORK) LOCK FLUSH 100 UNIT/ML IV SOLN
INTRAVENOUS | Status: AC
  Administered 2021-01-18: 12:00:00 500 [IU]
  Filled 2021-01-18: qty 5

## 2021-01-18 MED ORDER — TRASTUZUMAB-DKST CHEMO 150 MG IV SOLR
2.0000 mg/kg | Freq: Once | INTRAVENOUS | Status: AC
  Administered 2021-01-18: 10:00:00 168 mg via INTRAVENOUS
  Filled 2021-01-18: qty 8

## 2021-01-18 MED ORDER — FAMOTIDINE IN NACL 20-0.9 MG/50ML-% IV SOLN
20.0000 mg | Freq: Two times a day (BID) | INTRAVENOUS | Status: DC
  Administered 2021-01-18: 09:00:00 20 mg via INTRAVENOUS
  Filled 2021-01-18: qty 50

## 2021-01-18 MED ORDER — DIPHENHYDRAMINE HCL 50 MG/ML IJ SOLN
50.0000 mg | Freq: Once | INTRAMUSCULAR | Status: AC
  Administered 2021-01-18: 09:00:00 50 mg via INTRAVENOUS
  Filled 2021-01-18: qty 1

## 2021-01-18 MED ORDER — SODIUM CHLORIDE 0.9 % IV SOLN
80.0000 mg/m2 | Freq: Once | INTRAVENOUS | Status: AC
  Administered 2021-01-18: 11:00:00 156 mg via INTRAVENOUS
  Filled 2021-01-18: qty 26

## 2021-01-18 MED ORDER — HEPARIN SOD (PORK) LOCK FLUSH 100 UNIT/ML IV SOLN
500.0000 [IU] | Freq: Once | INTRAVENOUS | Status: AC | PRN
  Filled 2021-01-18: qty 5

## 2021-01-18 MED ORDER — SODIUM CHLORIDE 0.9 % IV SOLN
Freq: Once | INTRAVENOUS | Status: AC
  Filled 2021-01-18: qty 250

## 2021-01-18 NOTE — Progress Notes (Signed)
Last dose [name] on 12/16 - per MD ok to continue without loading again (continue on current dose).

## 2021-01-18 NOTE — Progress Notes (Signed)
[place] NOTE  Patient Care Team: [name], MD as PCP - General (Family Medicine)  CHIEF COMPLAINTS/PURPOSE OF CONSULTATION: Breast cancer  #  Oncology History Overview Note   #Right breast-invasive mammary carcinoma 2 o'clock position #1.1 cm-ER/PR HER2/neu positive [3+]; SEP, 2022-MRI-up to 3 cm non-mass enhancement noted.  #Left breast- MRI #upper outer quadrant- 13m #retroareolar 1cm-. NEGATIVE for malignancy   EProcedure: Lumpectomy  Specimen Laterality: Right  Histologic Type: Invasive ductal carcinoma  Histologic Grade:       Glandular (Acinar)/Tubular Differentiation: 3       Nuclear Pleomorphism: 2       Mitotic Rate: 1       Overall Grade: 2  Tumor Size: 0.9 cm  Ductal Carcinoma In Situ: Present, intermediate grade  Treatment Effect in the Breast: No known presurgical therapy  Margins: All margins negative for invasive carcinoma       Distance from Closest Margin (mm): Cannot be determined  DCIS Margins: Uninvolved by DCIS       Distance from Closest Margin (mm): Cannot be determined  Regional Lymph Nodes:       Number of Lymph Nodes Examined: 5       Number of Sentinel Nodes Examined: 5       Number of Lymph Nodes with Macrometastases (>2 mm): 0       Number of Lymph Nodes with Micrometastases: 0       Number of Lymph Nodes with Isolated Tumor Cells (=0.2 mm or =200  cells): 0       Size of Largest Metastatic Deposit (mm): Not applicable       Extranodal Extension: Not applicable  Distant Metastasis:       Distant Site(s) Involved: Not applicable  Breast Biomarker Testing Performed on Previous Biopsy:       Testing Performed on Case Number: [id number]             Estrogen Receptor: 95%, positive, strong staining intensity             Progesterone Receptor: 95%, positive, strong staining  intensity             HER2: Positive (3+)             Ki-67: 15%  Pathologic Stage Classification (pTNM, AJCC 8th Edition): pT1b, pN0   Representative Tumor Block: A4  Comment(s): None   # NOV 4th, 2022- TAXOL-[name] q W  ---------------------------------   # Headaches-Migraine/ Anxiety [[name]; Dr.Yan  botox]  #Genetic testing-s/p genetic counseling-expanded panel negative.  #Reproductive counseling: DECLINED reproductive endocrinology.     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  The report dates are October 01, 2020 and October 11, 2020, respectively.    The BRCAplus panel offered by [name] includes sequencing and deletion/duplication analysis for the following 8 genes: ATM, BRCA1, BRCA2, CDH1, CHEK2, PALB2, PTEN, and TP53.  The CustomNext-Cancer+RNAinsight panel offered by [employer] sequencing and rearrangement analysis for the following 47 genes:  APC, ATM, AXIN2, BARD1, BMPR1A, BRCA1, BRCA2, BRIP1, CDH1, CDK4, CDKN2A, CHEK2, DICER1, EPCAM, GREM1, HOXB13, MEN1, MLH1, MSH2, MSH3, MSH6, MUTYH, NBN, NF1, NF2, NTHL1, PALB2, PMS2, POLD1, POLE, PTEN, RAD51C, RAD51D, RECQL, RET, SDHA, SDHAF2,  SDHB, SDHC, SDHD, SMAD4, SMARCA4, STK11, TP53, TSC1, TSC2, and VHL.  RNA data is routinely analyzed for use in variant interpretation for all genes.   11/23/2020 -  Chemotherapy   Patient is on Treatment Plan : BREAST Paclitaxel + Trastuzumab q7d / Trastuzumab q21d        HISTORY OF PRESENTING ILLNESS: Ambulating independently.  Alone.  Alexandra Warren 40 y.o.  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  In the interim patient was evaluated by dermatology for skin rash.  Patient had  cultures taken of her scalp.  No biopsies sent.  Not on any antibiotics.  Chronic mild nausea without vomiting.  Mild diarrhea.  Not any worse.  No significant tingling numbness.  Complains of rash under the abdominal fold.  Currently.  Review of Systems  Constitutional:  Negative for chills, diaphoresis, fever, malaise/fatigue and weight loss.  HENT:  Negative for nosebleeds and sore throat.   Eyes:  Negative for double vision.  Respiratory:  Negative for cough, hemoptysis, sputum production, shortness of breath and wheezing.   Cardiovascular:  Negative for chest pain, palpitations, orthopnea and leg swelling.  Gastrointestinal:  Negative for abdominal pain, blood in stool, constipation, diarrhea, heartburn, melena, nausea and vomiting.  Genitourinary:  Negative for dysuria, frequency and urgency.  Musculoskeletal:  Positive for myalgias. Negative for back pain and joint pain.  Skin: Negative.  Negative for itching and rash.  Neurological:  Positive for headaches. Negative for dizziness, tingling, focal weakness and weakness.  Endo/Heme/Allergies:  Does not bruise/bleed easily.  Psychiatric/Behavioral:  Negative for depression. The patient is nervous/anxious and has insomnia.   All other systems reviewed and are negative.   MEDICAL HISTORY:  Past Medical History:  Diagnosis Date   Acid reflux    Anxiety and depression    Dr. Gala Murdoch, MD   Back pain    Family history of breast cancer 09/21/2020   Family history of malignant neoplasm of digestive organ 09/21/2020   Fibromyalgia    History of gallstones    Malignant neoplasm of upper-inner quadrant of right female breast ([place]) 09/21/2020   Migraine     SURGICAL HISTORY: Past Surgical History:  Procedure Laterality Date   BREAST LUMPECTOMY WITH RADIOACTIVE SEED LOCALIZATION Right 10/17/2020   Procedure: RIGHT BREAST LUMPECTOMY WITH RADIOACTIVE SEED LOCALIZATION WITH RIGHT SENTINEL LYMPH NODE BIOPSY;  Surgeon: Stark Klein, MD;   Location: [facility];  Service: General;  Laterality: Right;   CHOLECYSTECTOMY  12/2004   PORTACATH PLACEMENT Left 10/17/2020   Procedure: INSERTION PORT-A-CATH;  Surgeon: Stark Klein, MD;  Location: [facility];  Service: General;  Laterality: Left;    SOCIAL HISTORY: Social History   Socioeconomic History   Marital status: Divorced    Spouse name: Corene Cornea   Number of children: 0   Years of education: MS   Highest education level: Not on file  Occupational History   Occupation: Optometrist  Tobacco Use   Smoking status: Never   Smokeless tobacco: Never  Vaping Use   Vaping Use: Never used  Substance and Sexual Activity   Alcohol use: Yes    Comment: pt states 1 glass of wine maybe once a month   Drug use: No   Sexual activity: Yes    Birth control/protection: Condom  Other Topics Concern   Not on file  Social History Narrative   Accountant for commercial firm; wants short term disability;  never smoked; 2 drinks  a month. Lives in [place] with boy friend [[place] No children. Has one brother    Social Determinants of Radio broadcast assistant Strain: Not on file  Food Insecurity: Not on file  Transportation Needs: Not on file  Physical Activity: Not on file  Stress: Not on file  Social Connections: Not on file  Intimate Partner Violence: Not on file    FAMILY HISTORY: Family History  Problem Relation Age of Onset   Healthy Mother    Hypertension Father    Skin cancer Brother        dx before 15   Colon cancer Maternal Aunt 50   Cervical cancer Maternal Aunt        dx before 54; x2   Cancer Paternal Grandmother 99       unknown primary; ? liver   Lung cancer Paternal Grandfather        dx after 80   Breast cancer Cousin 60       maternal cousin   Colon cancer Other        MGM's sister   Gastric cancer Other        PGM's sister   Leukemia Other        PMG's sister   Rectal cancer Neg Hx    Esophageal cancer Neg Hx     ALLERGIES:  is  allergic to antihistamines, diphenhydramine-type; diphenhydramine hcl; promethazine; and pseudoeph-doxylamine-dm-apap.  MEDICATIONS:  Current Outpatient Medications  Medication Sig Dispense Refill   almotriptan (AXERT) 12.5 MG tablet Take 1 tab at onset of migraine.  May repeat in 2 hrs, if needed.  Max dose: 2 tabs/day. This is a 30 day prescription. 12 tablet 11   azelastine (ASTELIN) 0.1 % nasal spray Place 1 spray into both nostrils daily as needed for rhinitis.     BOTOX 200 units SOLR Inject 200 Units into the skin See admin instructions. Every 90 days     busPIRone (BUSPAR) 15 MG tablet Take 15 mg by mouth in the morning, at noon, and at bedtime.     butalbital-acetaminophen-caffeine (FIORICET) 50-325-40 MG tablet Take 1 tablet by mouth every 8 (eight) hours as needed for headache. 10 tablet 5   clindamycin (CLEOCIN T) 1 % external solution Apply topically 2 (two) times daily as needed.     clonazePAM (KLONOPIN) 1 MG tablet Take 0.5-1 mg by mouth 2 (two) times daily as needed for anxiety.     diazepam (VALIUM) 5 MG tablet One pill 45-60 mins prior to procedure; 1 pill 15 mins prior if needed. 6 tablet 0   EMGALITY 120 MG/ML SOAJ Inject 120 mg into the skin every 30 (thirty) days. 3 mL 4   fluconazole (DIFLUCAN) 100 MG tablet Take 1 tablet (100 mg total) by mouth daily. As needed for yeast infection. 6 tablet 0   lidocaine-prilocaine (EMLA) cream Apply 1 application topically as needed. 30 g 4   omeprazole (PRILOSEC) 20 MG capsule Take 20 mg by mouth daily.     ondansetron (ZOFRAN) 8 MG tablet Take 1 tablet (8 mg total) by mouth every 8 (eight) hours as needed for nausea or vomiting. 30 tablet 3   predniSONE (DELTASONE) 10 MG tablet Take 1 tablet (10 mg total) by mouth daily with breakfast. 30 tablet 0   prochlorperazine (COMPAZINE) 10 MG tablet Take 1 tablet (10 mg total) by mouth every 6 (six) hours as needed for nausea or vomiting. 40 tablet 1   rosuvastatin (CRESTOR) 20  MG tablet Take  20 mg by mouth daily.     tiZANidine (ZANAFLEX) 4 MG tablet Take 1 tablet (4 mg total) by mouth every 8 (eight) hours as needed for muscle spasms (migraines). This is a 30 day rx. 20 tablet 5   topiramate (TOPAMAX) 100 MG tablet Take 1 tablet (100 mg total) by mouth 2 (two) times daily. (Patient taking differently: Take 200 mg by mouth at bedtime.) 60 tablet 11   traMADol (ULTRAM) 50 MG tablet Take 50 mg by mouth every 6 (six) hours as needed for pain.     vortioxetine HBr (TRINTELLIX) 20 MG TABS tablet Take 20 mg by mouth daily.  0   zolpidem (AMBIEN) 10 MG tablet Take 10 mg by mouth at bedtime as needed for sleep.     buPROPion (WELLBUTRIN XL) 300 MG 24 hr tablet Take 300 mg by mouth every morning. (Patient not taking: Reported on 01/18/2021)     minocycline (DYNACIN) 100 MG tablet Take 1 tablet (100 mg total) by mouth 2 (two) times daily. (Patient not taking: Reported on 01/18/2021) 45 tablet 1   OVER THE COUNTER MEDICATION Take 1 tablet by mouth daily. prevent a migraine (Patient not taking: Reported on 01/18/2021)     oxyCODONE (OXY IR/ROXICODONE) 5 MG immediate release tablet Take 1 tablet (5 mg total) by mouth every 6 (six) hours as needed for severe pain. (Patient not taking: Reported on 01/18/2021) 10 tablet 0   Rimegepant Sulfate (NURTEC) 75 MG TBDP Take 75 mg by mouth as needed (Take 1 at onset of headache, max is 75 mg in 24 hours). 8 tablet 11   triamcinolone ointment (KENALOG) 0.5 % Apply 1 application topically 2 (two) times daily. To scalp (Patient not taking: Reported on 01/18/2021) 30 g 1   No current facility-administered medications for this visit.   Facility-Administered Medications Ordered in Other Visits  Medication Dose Route Frequency Provider Last Rate Last Admin   famotidine (PEPCID) IVPB 20 mg premix  20 mg Intravenous Q12H Charlaine Dalton R, MD 100 mL/hr at 01/18/21 0916 20 mg at 01/18/21 0916   heparin lock flush 100 unit/mL  500 Units Intracatheter Once PRN  Cammie Sickle, MD       PACLitaxel (TAXOL) 156 mg in sodium chloride 0.9 % 250 mL chemo infusion (</= $RemoveBefor'80mg'PwvQjTPOyNyB$ /m2)  80 mg/m2 (Treatment Plan Recorded) Intravenous Once Cammie Sickle, MD 276 mL/hr at 01/18/21 1043 156 mg at 01/18/21 1043   PHYSICAL EXAMINATION: ECOG PERFORMANCE STATUS: 0 - Asymptomatic  Vitals:   01/18/21 0834  BP: (!) 117/93  Pulse: (!) 113  Temp: (!) 97.3 F (36.3 C)  SpO2: 97%    Filed Weights   01/18/21 0834  Weight: 184 lb 3.2 oz (83.6 kg)   Papular macular rash noted on the scalp.  Physical Exam Vitals and nursing note reviewed.  HENT:     Head: Normocephalic and atraumatic.     Mouth/Throat:     Pharynx: Oropharynx is clear.  Eyes:     Extraocular Movements: Extraocular movements intact.     Pupils: Pupils are equal, round, and reactive to light.  Cardiovascular:     Rate and Rhythm: Normal rate and regular rhythm.  Abdominal:     Palpations: Abdomen is soft.  Musculoskeletal:        General: Normal range of motion.     Cervical back: Normal range of motion.  Skin:    General: Skin is warm.  Neurological:     General: No  focal deficit present.     Mental Status: She is alert and oriented to person, place, and time.  Psychiatric:        Behavior: Behavior normal.        Judgment: Judgment normal.     LABORATORY DATA:  I have reviewed the data as listed Lab Results  Component Value Date   WBC 9.4 01/18/2021   HGB 13.5 01/18/2021   HCT 41.7 01/18/2021   MCV 97.7 01/18/2021   PLT 317 01/18/2021   Recent Labs    12/28/20 0811 01/04/21 0824 01/18/21 0816  NA 136 136 137  K 3.4* 3.4* 3.4*  CL 106 106 106  CO2 20* 19* 21*  GLUCOSE 128* 165* 113*  BUN _0 CREATININE 0.86 0.76 0.87  CALCIUM 8.8* 8.8* 8.7*  GFRNONAA >60 >60 >60  PROT 6.8 7.1 6.8  ALBUMIN 3.9 4.1 4.0  AST 37 39 33  ALT 49* 53* 63*  ALKPHOS 51 51 47  BILITOT 0.2* 0.6 0.2*    RADIOGRAPHIC STUDIES: I have personally reviewed the radiological  images as listed and agreed with the findings in the report. No results found.  ASSESSMENT & PLAN:   Carcinoma of upper-inner quadrant of right breast in female, estrogen receptor positive ([place]) # STAGE-I- Right breast-invasive mammary carcinoma- pT1bN0 [12m]- ER/PR-Positive; Her-2 positive.Proceed with Taxol [name]-weekly x12 followed by [name] every 3 weeks for 12 months.OCT 2022-  MUGA scan- 58%.  # Proceed with weekly Taxol-[name] #3-day 15  today today. Labs today reviewed;  acceptable for treatment today.   # AST elevation-63- G-1 from Taxol- monitor fo now.   # Nausea- no vomiting-improved with antiemetics-STABLE  # Diarrhea- G-1-2; immodium- STABLE  # Headaches/Migraines-on Fioricet/amlotriptan; and also Emgalti SQ.-STABLE  # Fibromyalgia- ok to proceed with  Injections/ortho.STABLE   # Folliculitis- Rash on scalp: Secondary to Taxol. S/p eval with Dermatology [Dr.Kim]  vaginal yeast infection/inframammary fungal dermatitis-recommend Diflucan as needed.  # DISPOSITION: # chemo today.  # 1 week- labs- cbc/cmp-Taxol-[name] #  2 weeks-  MD-; labs- cbc/cmp; taxol-[name] weekly-Dr.B   All questions were answered. The patient/family knows to call the clinic with any problems, questions or concerns.     GCammie Sickle MD 01/18/2021 10:48 AM

## 2021-01-18 NOTE — Patient Instructions (Signed)
[facility] AT [place]  Discharge Instructions: Thank you for choosing [facility] to provide your oncology and hematology care.  If you have a lab appointment with the [facility], please go directly to the [facility] and check in at the registration area.  Wear comfortable clothing and clothing appropriate for easy access to any Portacath or PICC line.   We strive to give you quality time with your provider. You may need to reschedule your appointment if you arrive late (15 or more minutes).  Arriving late affects you and other patients whose appointments are after yours.  Also, if you miss three or more appointments without notifying the office, you may be dismissed from the clinic at the providers discretion.      For prescription refill requests, have your pharmacy contact our office and allow 72 hours for refills to be completed.    Today you received the following chemotherapy and/or immunotherapy agents OGIVIRI and TAXOL      To help prevent nausea and vomiting after your treatment, we encourage you to take your nausea medication as directed.  BELOW ARE SYMPTOMS THAT SHOULD BE REPORTED IMMEDIATELY: *FEVER GREATER THAN 100.4 F (38 C) OR HIGHER *CHILLS OR SWEATING *NAUSEA AND VOMITING THAT IS NOT CONTROLLED WITH YOUR NAUSEA MEDICATION *UNUSUAL SHORTNESS OF BREATH *UNUSUAL BRUISING OR BLEEDING *URINARY PROBLEMS (pain or burning when urinating, or frequent urination) *BOWEL PROBLEMS (unusual diarrhea, constipation, pain near the anus) TENDERNESS IN MOUTH AND THROAT WITH OR WITHOUT PRESENCE OF ULCERS (sore throat, sores in mouth, or a toothache) UNUSUAL RASH, SWELLING OR PAIN  UNUSUAL VAGINAL DISCHARGE OR ITCHING   Items with * indicate a potential emergency and should be followed up as soon as possible or go to the Emergency Department if any problems should occur.  Please show the CHEMOTHERAPY ALERT CARD or IMMUNOTHERAPY ALERT CARD at  check-in to the Emergency Department and triage nurse.  Should you have questions after your visit or need to cancel or reschedule your appointment, please contact [facility] [place] AT [place]  [phone] and follow the prompts.  Office hours are 8:00 a.m. to 4:30 p.m. Monday - Friday. Please note that voicemails left after 4:00 p.m. may not be returned until the following business day.  We are closed weekends and major holidays. You have access to a nurse at all times for urgent questions. Please call the main number to the clinic [phone] and follow the prompts.  For any non-urgent questions, you may also contact your provider using MyChart. We now offer e-Visits for anyone 70 and older to request care online for non-urgent symptoms. For details visit [email or web address].   Also download the MyChart app! Go to the app store, search "MyChart", open the app, select [facility], and log in with your MyChart username and password.  Due to Covid, a mask is required upon entering the hospital/clinic. If you do not have a mask, one will be given to you upon arrival. For doctor visits, patients may have 1 support person aged 7 or older with them. For treatment visits, patients cannot have anyone with them due to current Covid guidelines and our immunocompromised population.   Trastuzumab injection for infusion What is this medication? TRASTUZUMAB (tras TOO zoo mab) is a monoclonal antibody. It is used to treat breast cancer and stomach cancer. This medicine may be used for other purposes; ask your health care provider or pharmacist if you have questions. COMMON BRAND NAME(S): [name], [name], [name],  [name] What should I tell my care team before I take this medication? They need to know if you have any of these conditions: heart disease heart failure lung or breathing disease, like asthma an unusual or allergic reaction to trastuzumab, benzyl  alcohol, or other medications, foods, dyes, or preservatives pregnant or trying to get pregnant breast-feeding How should I use this medication? This drug is given as an infusion into a vein. It is administered in a hospital or clinic by a specially trained health care professional. Talk to your pediatrician regarding the use of this medicine in children. This medicine is not approved for use in children. Overdosage: If you think you have taken too much of this medicine contact a poison control center or emergency room at once. NOTE: This medicine is only for you. Do not share this medicine with others. What if I miss a dose? It is important not to miss a dose. Call your doctor or health care professional if you are unable to keep an appointment. What may interact with this medication? This medicine may interact with the following medications: certain types of chemotherapy, such as daunorubicin, doxorubicin, epirubicin, and idarubicin This list may not describe all possible interactions. Give your health care provider a list of all the medicines, herbs, non-prescription drugs, or dietary supplements you use. Also tell them if you smoke, drink alcohol, or use illegal drugs. Some items may interact with your medicine. What should I watch for while using this medication? Visit your doctor for checks on your progress. Report any side effects. Continue your course of treatment even though you feel ill unless your doctor tells you to stop. Call your doctor or health care professional for advice if you get a fever, chills or sore throat, or other symptoms of a cold or flu. Do not treat yourself. Try to avoid being around people who are sick. You may experience fever, chills and shaking during your first infusion. These effects are usually mild and can be treated with other medicines. Report any side effects during the infusion to your health care professional. Fever and chills usually do not happen with  later infusions. Do not become pregnant while taking this medicine or for 7 months after stopping it. Women should inform their doctor if they wish to become pregnant or think they might be pregnant. Women of child-bearing potential will need to have a negative pregnancy test before starting this medicine. There is a potential for serious side effects to an unborn child. Talk to your health care professional or pharmacist for more information. Do not breast-feed an infant while taking this medicine or for 7 months after stopping it. Women must use effective birth control with this medicine. What side effects may I notice from receiving this medication? Side effects that you should report to your doctor or health care professional as soon as possible: allergic reactions like skin rash, itching or hives, swelling of the face, lips, or tongue chest pain or palpitations cough dizziness feeling faint or lightheaded, falls fever general ill feeling or flu-like symptoms signs of worsening heart failure like breathing problems; swelling in your legs and feet unusually weak or tired Side effects that usually do not require medical attention (report to your doctor or health care professional if they continue or are bothersome): bone pain changes in taste diarrhea joint pain nausea/vomiting weight loss This list may not describe all possible side effects. Call your doctor for medical advice about side effects. You may report side  effects to FDA at 1-800-FDA-1088. Where should I keep my medication? This drug is given in a hospital or clinic and will not be stored at home. NOTE: This sheet is a summary. It may not cover all possible information. If you have questions about this medicine, talk to your doctor, pharmacist, or health care provider.  2022 Elsevier/Gold Standard (2016-01-22 00:00:00)  Paclitaxel injection What is this medication? PACLITAXEL (PAK li TAX el) is a chemotherapy drug. It  targets fast dividing cells, like cancer cells, and causes these cells to die. This medicine is used to treat ovarian cancer, breast cancer, lung cancer, Kaposi's sarcoma, and other cancers. This medicine may be used for other purposes; ask your health care provider or pharmacist if you have questions. COMMON BRAND NAME(S): Onxol, Taxol What should I tell my care team before I take this medication? They need to know if you have any of these conditions: history of irregular heartbeat liver disease low blood counts, like low white cell, platelet, or red cell counts lung or breathing disease, like asthma tingling of the fingers or toes, or other nerve disorder an unusual or allergic reaction to paclitaxel, alcohol, polyoxyethylated castor oil, other chemotherapy, other medicines, foods, dyes, or preservatives pregnant or trying to get pregnant breast-feeding How should I use this medication? This drug is given as an infusion into a vein. It is administered in a hospital or clinic by a specially trained health care professional. Talk to your pediatrician regarding the use of this medicine in children. Special care may be needed. Overdosage: If you think you have taken too much of this medicine contact a poison control center or emergency room at once. NOTE: This medicine is only for you. Do not share this medicine with others. What if I miss a dose? It is important not to miss your dose. Call your doctor or health care professional if you are unable to keep an appointment. What may interact with this medication? Do not take this medicine with any of the following medications: live virus vaccines This medicine may also interact with the following medications: antiviral medicines for hepatitis, HIV or AIDS certain antibiotics like erythromycin and clarithromycin certain medicines for fungal infections like ketoconazole and itraconazole certain medicines for seizures like carbamazepine,  phenobarbital, phenytoin gemfibrozil nefazodone rifampin St. John's wort This list may not describe all possible interactions. Give your health care provider a list of all the medicines, herbs, non-prescription drugs, or dietary supplements you use. Also tell them if you smoke, drink alcohol, or use illegal drugs. Some items may interact with your medicine. What should I watch for while using this medication? Your condition will be monitored carefully while you are receiving this medicine. You will need important blood work done while you are taking this medicine. This medicine can cause serious allergic reactions. To reduce your risk you will need to take other medicine(s) before treatment with this medicine. If you experience allergic reactions like skin rash, itching or hives, swelling of the face, lips, or tongue, tell your doctor or health care professional right away. In some cases, you may be given additional medicines to help with side effects. Follow all directions for their use. This drug may make you feel generally unwell. This is not uncommon, as chemotherapy can affect healthy cells as well as cancer cells. Report any side effects. Continue your course of treatment even though you feel ill unless your doctor tells you to stop. Call your doctor or health care professional for advice if  you get a fever, chills or sore throat, or other symptoms of a cold or flu. Do not treat yourself. This drug decreases your body's ability to fight infections. Try to avoid being around people who are sick. This medicine may increase your risk to bruise or bleed. Call your doctor or health care professional if you notice any unusual bleeding. Be careful brushing and flossing your teeth or using a toothpick because you may get an infection or bleed more easily. If you have any dental work done, tell your dentist you are receiving this medicine. Avoid taking products that contain aspirin, acetaminophen,  ibuprofen, naproxen, or ketoprofen unless instructed by your doctor. These medicines may hide a fever. Do not become pregnant while taking this medicine. Women should inform their doctor if they wish to become pregnant or think they might be pregnant. There is a potential for serious side effects to an unborn child. Talk to your health care professional or pharmacist for more information. Do not breast-feed an infant while taking this medicine. Men are advised not to father a child while receiving this medicine. This product may contain alcohol. Ask your pharmacist or healthcare provider if this medicine contains alcohol. Be sure to tell all healthcare providers you are taking this medicine. Certain medicines, like metronidazole and disulfiram, can cause an unpleasant reaction when taken with alcohol. The reaction includes flushing, headache, nausea, vomiting, sweating, and increased thirst. The reaction can last from 30 minutes to several hours. What side effects may I notice from receiving this medication? Side effects that you should report to your doctor or health care professional as soon as possible: allergic reactions like skin rash, itching or hives, swelling of the face, lips, or tongue breathing problems changes in vision fast, irregular heartbeat high or low blood pressure mouth sores pain, tingling, numbness in the hands or feet signs of decreased platelets or bleeding - bruising, pinpoint red spots on the skin, black, tarry stools, blood in the urine signs of decreased red blood cells - unusually weak or tired, feeling faint or lightheaded, falls signs of infection - fever or chills, cough, sore throat, pain or difficulty passing urine signs and symptoms of liver injury like dark yellow or brown urine; general ill feeling or flu-like symptoms; light-colored stools; loss of appetite; nausea; right upper belly pain; unusually weak or tired; yellowing of the eyes or skin swelling of the  ankles, feet, hands unusually slow heartbeat Side effects that usually do not require medical attention (report to your doctor or health care professional if they continue or are bothersome): diarrhea hair loss loss of appetite muscle or joint pain nausea, vomiting pain, redness, or irritation at site where injected tiredness This list may not describe all possible side effects. Call your doctor for medical advice about side effects. You may report side effects to FDA at 1-800-FDA-1088. Where should I keep my medication? This drug is given in a hospital or clinic and will not be stored at home. NOTE: This sheet is a summary. It may not cover all possible information. If you have questions about this medicine, talk to your doctor, pharmacist, or health care provider.  2022 Elsevier/Gold Standard (2020-09-25 00:00:00)

## 2021-01-18 NOTE — Assessment & Plan Note (Addendum)
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  # Proceed with weekly Taxol-[name] #3-day 15  today today. Labs today reviewed;  acceptable for treatment today.   # AST elevation-63- G-1 from Taxol- monitor fo now.   # Nausea- no vomiting-improved with antiemetics-STABLE  # Diarrhea- G-1-2; immodium- STABLE  # Headaches/Migraines-on Fioricet/amlotriptan; and also Emgalti SQ.-STABLE  # Fibromyalgia- ok to proceed with  Injections/ortho.STABLE   # Folliculitis- Rash on scalp: Secondary to Taxol. S/p eval with Dermatology [Dr.Kim]  vaginal yeast infection/inframammary fungal dermatitis-recommend Diflucan as needed.  # DISPOSITION: # chemo today.  # 1 week- labs- cbc/cmp-Taxol-[name] #  2 weeks-  MD-; labs- cbc/cmp; taxol-[name] weekly-Dr.B

## 2021-01-18 NOTE — Progress Notes (Signed)
Per Dr Rogue Bussing- ok to proceed with HR 113

## 2021-01-21 ENCOUNTER — Other Ambulatory Visit: Payer: Self-pay | Admitting: Internal Medicine

## 2021-01-22 ENCOUNTER — Encounter: Payer: Self-pay | Admitting: Internal Medicine

## 2021-01-25 ENCOUNTER — Encounter: Payer: Self-pay | Admitting: Internal Medicine

## 2021-01-25 ENCOUNTER — Inpatient Hospital Stay: Payer: PRIVATE HEALTH INSURANCE

## 2021-01-25 ENCOUNTER — Other Ambulatory Visit: Payer: Self-pay

## 2021-01-25 ENCOUNTER — Inpatient Hospital Stay (HOSPITAL_BASED_OUTPATIENT_CLINIC_OR_DEPARTMENT_OTHER): Payer: PRIVATE HEALTH INSURANCE | Admitting: Internal Medicine

## 2021-01-25 ENCOUNTER — Inpatient Hospital Stay: Payer: PRIVATE HEALTH INSURANCE | Attending: Internal Medicine

## 2021-01-25 DIAGNOSIS — Z170 Estrogen receptor positive status [ER+]: Secondary | ICD-10-CM | POA: Diagnosis not present

## 2021-01-25 DIAGNOSIS — Z5111 Encounter for antineoplastic chemotherapy: Secondary | ICD-10-CM | POA: Insufficient documentation

## 2021-01-25 DIAGNOSIS — Z5112 Encounter for antineoplastic immunotherapy: Secondary | ICD-10-CM | POA: Diagnosis present

## 2021-01-25 DIAGNOSIS — C50211 Malignant neoplasm of upper-inner quadrant of right female breast: Secondary | ICD-10-CM

## 2021-01-25 DIAGNOSIS — Z79899 Other long term (current) drug therapy: Secondary | ICD-10-CM | POA: Insufficient documentation

## 2021-01-25 LAB — COMPREHENSIVE METABOLIC PANEL
ALT: 65 U/L — ABNORMAL HIGH (ref 0–44)
AST: 33 U/L (ref 15–41)
Albumin: 4.1 g/dL (ref 3.5–5.0)
Alkaline Phosphatase: 48 U/L (ref 38–126)
Anion gap: 8 (ref 5–15)
BUN: 9 mg/dL (ref 6–20)
CO2: 21 mmol/L — ABNORMAL LOW (ref 22–32)
Calcium: 8.8 mg/dL — ABNORMAL LOW (ref 8.9–10.3)
Chloride: 108 mmol/L (ref 98–111)
Creatinine, Ser: 0.87 mg/dL (ref 0.44–1.00)
GFR, Estimated: >60 mL/min (ref >60)
Glucose, Bld: 112 mg/dL — ABNORMAL HIGH (ref 70–99)
Potassium: 3.5 mmol/L (ref 3.5–5.1)
Sodium: 137 mmol/L (ref 135–145)
Total Bilirubin: 0.5 mg/dL (ref 0.3–1.2)
Total Protein: 7 g/dL (ref 6.5–8.1)

## 2021-01-25 LAB — CBC WITH DIFFERENTIAL/PLATELET
Abs Immature Granulocytes: 0.13 10*3/uL — ABNORMAL HIGH (ref 0.00–0.07)
Basophils Absolute: 0.1 10*3/uL (ref 0.0–0.1)
Basophils Relative: 1 %
Eosinophils Absolute: 0.4 10*3/uL (ref 0.0–0.5)
Eosinophils Relative: 5 %
HCT: 39.3 % (ref 36.0–46.0)
Hemoglobin: 13.1 g/dL (ref 12.0–15.0)
Immature Granulocytes: 1 %
Lymphocytes Relative: 45 %
Lymphs Abs: 4.1 10*3/uL — ABNORMAL HIGH (ref 0.7–4.0)
MCH: 32 pg (ref 26.0–34.0)
MCHC: 33.3 g/dL (ref 30.0–36.0)
MCV: 95.9 fL (ref 80.0–100.0)
Monocytes Absolute: 0.5 10*3/uL (ref 0.1–1.0)
Monocytes Relative: 5 %
Neutro Abs: 4 10*3/uL (ref 1.7–7.7)
Neutrophils Relative %: 43 %
Platelets: 323 10*3/uL (ref 150–400)
RBC: 4.1 MIL/uL (ref 3.87–5.11)
RDW: 12.9 % (ref 11.5–15.5)
WBC: 9.3 10*3/uL (ref 4.0–10.5)
nRBC: 0 % (ref 0.0–0.2)

## 2021-01-25 LAB — PREGNANCY, URINE: Preg Test, Ur: NEGATIVE

## 2021-01-25 MED ORDER — TRASTUZUMAB-DKST CHEMO 150 MG IV SOLR
2.0000 mg/kg | Freq: Once | INTRAVENOUS | Status: AC
  Administered 2021-01-25: 168 mg via INTRAVENOUS
  Filled 2021-01-25: qty 8

## 2021-01-25 MED ORDER — HEPARIN SOD (PORK) LOCK FLUSH 100 UNIT/ML IV SOLN
INTRAVENOUS | Status: AC
  Administered 2021-01-25: 500 [IU]
  Filled 2021-01-25: qty 5

## 2021-01-25 MED ORDER — HEPARIN SOD (PORK) LOCK FLUSH 100 UNIT/ML IV SOLN
500.0000 [IU] | Freq: Once | INTRAVENOUS | Status: AC | PRN
  Filled 2021-01-25: qty 5

## 2021-01-25 MED ORDER — SODIUM CHLORIDE 0.9 % IV SOLN
10.0000 mg | Freq: Once | INTRAVENOUS | Status: AC
  Administered 2021-01-25: 10 mg via INTRAVENOUS
  Filled 2021-01-25: qty 10

## 2021-01-25 MED ORDER — FAMOTIDINE IN NACL 20-0.9 MG/50ML-% IV SOLN
20.0000 mg | Freq: Once | INTRAVENOUS | Status: AC
  Administered 2021-01-25: 20 mg via INTRAVENOUS
  Filled 2021-01-25: qty 50

## 2021-01-25 MED ORDER — SODIUM CHLORIDE 0.9 % IV SOLN
80.0000 mg/m2 | Freq: Once | INTRAVENOUS | Status: AC
  Administered 2021-01-25: 156 mg via INTRAVENOUS
  Filled 2021-01-25: qty 26

## 2021-01-25 MED ORDER — SODIUM CHLORIDE 0.9 % IV SOLN
Freq: Once | INTRAVENOUS | Status: AC
  Filled 2021-01-25: qty 250

## 2021-01-25 MED ORDER — DIPHENHYDRAMINE HCL 50 MG/ML IJ SOLN
50.0000 mg | Freq: Once | INTRAMUSCULAR | Status: AC
  Administered 2021-01-25: 50 mg via INTRAVENOUS
  Filled 2021-01-25: qty 1

## 2021-01-25 MED ORDER — ACETAMINOPHEN 325 MG PO TABS
650.0000 mg | ORAL_TABLET | Freq: Once | ORAL | Status: AC
  Administered 2021-01-25: 650 mg via ORAL
  Filled 2021-01-25: qty 2

## 2021-01-25 NOTE — Assessment & Plan Note (Addendum)
#  STAGE-I- Right breast-invasive mammary carcinoma- pT1bN0 [41m]- ER/PR-Positive; Her-2 positive.Proceed with Taxol [name]-weekly x12 followed by [name] every 3 weeks for 12 months.OCT 2022-  MUGA scan- 58%.  # Proceed with weekly Taxol-[name] #3-day 22  today today. Labs today reviewed;  acceptable for treatment today.   # AST elevation-65- G-1 from Taxol- monitor fo now.   # Nausea- no vomiting-improved with antiemetics-STABLE   # Diarrhea- G-1-2; immodium- STABLE   # Headaches/Migraines-on Fioricet/amlotriptan; and also Emgalti SQ.-STABLE   # Fibromyalgia- ok to proceed with  Injections/ortho.STABLE   # Folliculitis- Rash on scalp: Secondary to Taxol. S/p eval with Dermatology [Dr.Kim]  vaginal yeast infection/inframammary fungal dermatitis-recommend Diflucan as needed; recommend CeraVE-Itch.STABLE.   # DISPOSITION: # Referal to Dr.Chrystal re: Breast cancer # chemo today.  # 1 week- labs- cbc/cmp-Taxol-[name] #  2 weeks-  MD-; labs- cbc/cmp; taxol-[name] weekly-Dr.B

## 2021-01-25 NOTE — Progress Notes (Signed)
[place] NOTE  Patient Care Team: [name], MD as PCP - General (Family Medicine)  CHIEF COMPLAINTS/PURPOSE OF CONSULTATION: Breast cancer  #  Oncology History Overview Note   #Right breast-invasive mammary carcinoma 2 o'clock position #1.1 cm-ER/PR HER2/neu positive [3+]; SEP, 2022-MRI-up to 3 cm non-mass enhancement noted.  #Left breast- MRI #upper outer quadrant- 13m #retroareolar 1cm-. NEGATIVE for malignancy   EProcedure: Lumpectomy  Specimen Laterality: Right  Histologic Type: Invasive ductal carcinoma  Histologic Grade:       Glandular (Acinar)/Tubular Differentiation: 3       Nuclear Pleomorphism: 2       Mitotic Rate: 1       Overall Grade: 2  Tumor Size: 0.9 cm  Ductal Carcinoma In Situ: Present, intermediate grade  Treatment Effect in the Breast: No known presurgical therapy  Margins: All margins negative for invasive carcinoma       Distance from Closest Margin (mm): Cannot be determined  DCIS Margins: Uninvolved by DCIS       Distance from Closest Margin (mm): Cannot be determined  Regional Lymph Nodes:       Number of Lymph Nodes Examined: 5       Number of Sentinel Nodes Examined: 5       Number of Lymph Nodes with Macrometastases (>2 mm): 0       Number of Lymph Nodes with Micrometastases: 0       Number of Lymph Nodes with Isolated Tumor Cells (=0.2 mm or =200  cells): 0       Size of Largest Metastatic Deposit (mm): Not applicable       Extranodal Extension: Not applicable  Distant Metastasis:       Distant Site(s) Involved: Not applicable  Breast Biomarker Testing Performed on Previous Biopsy:       Testing Performed on Case Number: [id number]             Estrogen Receptor: 95%, positive, strong staining intensity             Progesterone Receptor: 95%, positive, strong staining  intensity             HER2: Positive (3+)             Ki-67: 15%  Pathologic Stage Classification (pTNM, AJCC 8th Edition): pT1b, pN0   Representative Tumor Block: A4  Comment(s): None   # NOV 4th, 2022- TAXOL-[name] q W  ---------------------------------   # Headaches-Migraine/ Anxiety [[name]; Dr.Yan  botox]  #Genetic testing-s/p genetic counseling-expanded panel negative.  #Reproductive counseling: DECLINED reproductive endocrinology.     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  The report dates are October 01, 2020 and October 11, 2020, respectively.    The BRCAplus panel offered by [name] includes sequencing and deletion/duplication analysis for the following 8 genes: ATM, BRCA1, BRCA2, CDH1, CHEK2, PALB2, PTEN, and TP53.  The CustomNext-Cancer+RNAinsight panel offered by [employer] sequencing and rearrangement analysis for the following 47 genes:  APC, ATM, AXIN2, BARD1, BMPR1A, BRCA1, BRCA2, BRIP1, CDH1, CDK4, CDKN2A, CHEK2, DICER1, EPCAM, GREM1, HOXB13, MEN1, MLH1, MSH2, MSH3, MSH6, MUTYH, NBN, NF1, NF2, NTHL1, PALB2, PMS2, POLD1, POLE, PTEN, RAD51C, RAD51D, RECQL, RET, SDHA, SDHAF2,  SDHB, SDHC, SDHD, SMAD4, SMARCA4, STK11, TP53, TSC1, TSC2, and VHL.  RNA data is routinely analyzed for use in variant interpretation for all genes.   11/23/2020 -  Chemotherapy   Patient is on Treatment Plan : BREAST Paclitaxel + Trastuzumab q7d / Trastuzumab q21d        HISTORY OF PRESENTING ILLNESS: Ambulating independently.  Alone.  [name] 41 y.o.  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  Patient continues to have mild rash on the scalp.  Itching.  Chronic mild nausea  without any vomiting.  Mild diarrhea.  No significant tingling or numbness.  Patient had 2 episodes of migraine in the interim.   Review of Systems  Constitutional:  Negative for chills, diaphoresis, fever, malaise/fatigue and weight loss.  HENT:  Negative for nosebleeds and sore throat.   Eyes:  Negative for double vision.  Respiratory:  Negative for cough, hemoptysis, sputum production, shortness of breath and wheezing.   Cardiovascular:  Negative for chest pain, palpitations, orthopnea and leg swelling.  Gastrointestinal:  Negative for abdominal pain, blood in stool, constipation, diarrhea, heartburn, melena, nausea and vomiting.  Genitourinary:  Negative for dysuria, frequency and urgency.  Musculoskeletal:  Positive for myalgias. Negative for back pain and joint pain.  Skin:  Positive for itching and rash.  Neurological:  Positive for headaches. Negative for dizziness, tingling, focal weakness and weakness.  Endo/Heme/Allergies:  Does not bruise/bleed easily.  Psychiatric/Behavioral:  Negative for depression. The patient is nervous/anxious and has insomnia.   All other systems reviewed and are negative.   MEDICAL HISTORY:  Past Medical History:  Diagnosis Date   Acid reflux    Anxiety and depression    Dr. Gala Murdoch, MD   Back pain    Family history of breast cancer 09/21/2020   Family history of malignant neoplasm of digestive organ 09/21/2020   Fibromyalgia    History of gallstones    Malignant neoplasm of upper-inner quadrant of right female breast ([place]) 09/21/2020   Migraine     SURGICAL HISTORY: Past Surgical History:  Procedure Laterality Date   BREAST LUMPECTOMY WITH RADIOACTIVE SEED LOCALIZATION Right 10/17/2020   Procedure: RIGHT BREAST LUMPECTOMY WITH RADIOACTIVE SEED LOCALIZATION WITH RIGHT SENTINEL LYMPH NODE BIOPSY;  Surgeon: Stark Klein, MD;  Location: [facility];  Service: General;  Laterality: Right;   CHOLECYSTECTOMY  12/2004   PORTACATH PLACEMENT Left  10/17/2020   Procedure: INSERTION PORT-A-CATH;  Surgeon: Stark Klein, MD;  Location: [facility] OR;  Service: General;  Laterality: Left;    SOCIAL HISTORY: Social History   Socioeconomic History   Marital status: Divorced    Spouse name: Corene Cornea   Number of children: 0   Years of education: MS   Highest education level: Not on file  Occupational History   Occupation: Optometrist  Tobacco Use   Smoking status: Never   Smokeless tobacco: Never  Vaping Use   Vaping Use: Never used  Substance and Sexual Activity   Alcohol use: Not Currently    Comment: pt states 1 glass of wine maybe once a month   Drug use: No   Sexual activity: Yes    Birth control/protection: Condom  Other Topics Concern   Not on file  Social History Narrative   Accountant for commercial firm; wants short term disability;  never smoked; 2 drinks a month. Lives in [place] with boy friend [[place] No children. Has one brother  Social Determinants of Health   Financial Resource Strain: Not on file  Food Insecurity: Not on file  Transportation Needs: Not on file  Physical Activity: Not on file  Stress: Not on file  Social Connections: Not on file  Intimate Partner Violence: Not on file    FAMILY HISTORY: Family History  Problem Relation Age of Onset   Healthy Mother    Hypertension Father    Skin cancer Brother        dx before 74   Colon cancer Maternal Aunt 50   Cervical cancer Maternal Aunt        dx before 16; x2   Cancer Paternal Grandmother 99       unknown primary; ? liver   Lung cancer Paternal Grandfather        dx after 75   Breast cancer Cousin 33       maternal cousin   Colon cancer Other        MGM's sister   Gastric cancer Other        PGM's sister   Leukemia Other        PMG's sister   Rectal cancer Neg Hx    Esophageal cancer Neg Hx     ALLERGIES:  is allergic to antihistamines, diphenhydramine-type; diphenhydramine hcl; promethazine; and  pseudoeph-doxylamine-dm-apap.  MEDICATIONS:  Current Outpatient Medications  Medication Sig Dispense Refill   almotriptan (AXERT) 12.5 MG tablet Take 1 tab at onset of migraine.  May repeat in 2 hrs, if needed.  Max dose: 2 tabs/day. This is a 30 day prescription. 12 tablet 11   azelastine (ASTELIN) 0.1 % nasal spray Place 1 spray into both nostrils daily as needed for rhinitis.     BOTOX 200 units SOLR Inject 200 Units into the skin See admin instructions. Every 90 days     butalbital-acetaminophen-caffeine (FIORICET) 50-325-40 MG tablet Take 1 tablet by mouth every 8 (eight) hours as needed for headache. 10 tablet 5   clindamycin (CLEOCIN T) 1 % external solution Apply topically 2 (two) times daily as needed.     clonazePAM (KLONOPIN) 1 MG tablet Take 0.5-1 mg by mouth 2 (two) times daily as needed for anxiety.     diazepam (VALIUM) 5 MG tablet One pill 45-60 mins prior to procedure; 1 pill 15 mins prior if needed. 6 tablet 0   EMGALITY 120 MG/ML SOAJ Inject 120 mg into the skin every 30 (thirty) days. 3 mL 4   fluconazole (DIFLUCAN) 100 MG tablet TAKE 1 TABLET(100 MG) BY MOUTH DAILY AS NEEDED FOR YEAST INFECTION 6 tablet 0   lidocaine-prilocaine (EMLA) cream Apply 1 application topically as needed. 30 g 4   omeprazole (PRILOSEC) 20 MG capsule Take 20 mg by mouth daily.     ondansetron (ZOFRAN) 8 MG tablet Take 1 tablet (8 mg total) by mouth every 8 (eight) hours as needed for nausea or vomiting. 30 tablet 3   predniSONE (DELTASONE) 10 MG tablet Take 1 tablet (10 mg total) by mouth daily with breakfast. 30 tablet 0   prochlorperazine (COMPAZINE) 10 MG tablet Take 1 tablet (10 mg total) by mouth every 6 (six) hours as needed for nausea or vomiting. 40 tablet 1   Rimegepant Sulfate (NURTEC) 75 MG TBDP Take 75 mg by mouth as needed (Take 1 at onset of headache, max is 75 mg in 24 hours). 8 tablet 11   rosuvastatin (CRESTOR) 20 MG tablet Take 20 mg by mouth daily.     tiZANidine (ZANAFLEX)  4 MG  tablet Take 1 tablet (4 mg total) by mouth every 8 (eight) hours as needed for muscle spasms (migraines). This is a 30 day rx. 20 tablet 5   topiramate (TOPAMAX) 100 MG tablet Take 1 tablet (100 mg total) by mouth 2 (two) times daily. (Patient taking differently: Take 200 mg by mouth at bedtime.) 60 tablet 11   traMADol (ULTRAM) 50 MG tablet Take 50 mg by mouth every 6 (six) hours as needed for pain.     vortioxetine HBr (TRINTELLIX) 20 MG TABS tablet Take 20 mg by mouth daily.  0   zolpidem (AMBIEN) 10 MG tablet Take 10 mg by mouth at bedtime as needed for sleep.     No current facility-administered medications for this visit.   Facility-Administered Medications Ordered in Other Visits  Medication Dose Route Frequency Provider Last Rate Last Admin   0.9 %  sodium chloride infusion   Intravenous Once Cammie Sickle, MD       acetaminophen (TYLENOL) tablet 650 mg  650 mg Oral Once Cammie Sickle, MD       dexamethasone (DECADRON) 10 mg in sodium chloride 0.9 % 50 mL IVPB  10 mg Intravenous Once Cammie Sickle, MD       diphenhydrAMINE (BENADRYL) injection 50 mg  50 mg Intravenous Once Cammie Sickle, MD       famotidine (PEPCID) IVPB 20 mg premix  20 mg Intravenous Once Charlaine Dalton R, MD       heparin lock flush 100 unit/mL  500 Units Intracatheter Once PRN Cammie Sickle, MD       PACLitaxel (TAXOL) 156 mg in sodium chloride 0.9 % 250 mL chemo infusion (</= $RemoveBefor'80mg'DFmYUGNTSyNf$ /m2)  80 mg/m2 (Treatment Plan Recorded) Intravenous Once Cammie Sickle, MD       trastuzumab-dkst ([name]) 168 mg in sodium chloride 0.9 % 250 mL chemo infusion  2 mg/kg (Treatment Plan Recorded) Intravenous Once Cammie Sickle, MD       PHYSICAL EXAMINATION: ECOG PERFORMANCE STATUS: 0 - Asymptomatic  Vitals:   01/25/21 0845  BP: (!) 123/95  Pulse: 94  Temp: 98.4 F (36.9 C)  SpO2: 99%    Filed Weights   01/25/21 0845  Weight: 183 lb 6.4 oz (83.2 kg)   Papular  macular rash noted on the scalp.  Physical Exam Vitals and nursing note reviewed.  HENT:     Head: Normocephalic and atraumatic.     Mouth/Throat:     Pharynx: Oropharynx is clear.  Eyes:     Extraocular Movements: Extraocular movements intact.     Pupils: Pupils are equal, round, and reactive to light.  Cardiovascular:     Rate and Rhythm: Normal rate and regular rhythm.  Abdominal:     Palpations: Abdomen is soft.  Musculoskeletal:        General: Normal range of motion.     Cervical back: Normal range of motion.  Skin:    General: Skin is warm.  Neurological:     General: No focal deficit present.     Mental Status: She is alert and oriented to person, place, and time.  Psychiatric:        Behavior: Behavior normal.        Judgment: Judgment normal.     LABORATORY DATA:  I have reviewed the data as listed Lab Results  Component Value Date   WBC 9.3 01/25/2021   HGB 13.1 01/25/2021   HCT 39.3 01/25/2021   MCV  95.9 01/25/2021   PLT 323 01/25/2021   Recent Labs    01/04/21 0824 01/18/21 0816 01/25/21 0825  NA 136 137 137  K 3.4* 3.4* 3.5  CL 106 106 108  CO2 19* 21* 21*  GLUCOSE 165* 113* 112*  BUN $Re'15 16 9  'rXa$ CREATININE 0.76 0.87 0.87  CALCIUM 8.8* 8.7* 8.8*  GFRNONAA >60 >60 >60  PROT 7.1 6.8 7.0  ALBUMIN 4.1 4.0 4.1  AST 39 33 33  ALT 53* 63* 65*  ALKPHOS 51 47 48  BILITOT 0.6 0.2* 0.5    RADIOGRAPHIC STUDIES: I have personally reviewed the radiological images as listed and agreed with the findings in the report. No results found.  ASSESSMENT & PLAN:   Carcinoma of upper-inner quadrant of right breast in female, estrogen receptor positive ([place]) # STAGE-I- Right breast-invasive mammary carcinoma- pT1bN0 [35mm]- ER/PR-Positive; Her-2 positive.Proceed with Taxol [name]-weekly x12 followed by [name] every 3 weeks for 12 months.OCT 2022-  MUGA scan- 58%.  # Proceed with weekly Taxol-[name] #3-day 22  today today. Labs today reviewed;   acceptable for treatment today.   # AST elevation-65- G-1 from Taxol- monitor fo now.   # Nausea- no vomiting-improved with antiemetics-STABLE   # Diarrhea- G-1-2; immodium- STABLE   # Headaches/Migraines-on Fioricet/amlotriptan; and also Emgalti SQ.-STABLE   # Fibromyalgia- ok to proceed with  Injections/ortho.STABLE   # Folliculitis- Rash on scalp: Secondary to Taxol. S/p eval with Dermatology [Dr.Kim]  vaginal yeast infection/inframammary fungal dermatitis-recommend Diflucan as needed; recommend CeraVE-Itch.STABLE.   # DISPOSITION: # Referal to Dr.Chrystal re: Breast cancer # chemo today.  # 1 week- labs- cbc/cmp-Taxol-[name] #  2 weeks-  MD-; labs- cbc/cmp; taxol-[name] weekly-Dr.B   All questions were answered. The patient/family knows to call the clinic with any problems, questions or concerns.     Cammie Sickle, MD 01/25/2021 9:15 AM

## 2021-01-28 ENCOUNTER — Encounter: Payer: Self-pay | Admitting: Internal Medicine

## 2021-01-29 ENCOUNTER — Encounter: Payer: Self-pay | Admitting: Internal Medicine

## 2021-01-30 ENCOUNTER — Ambulatory Visit
Admission: RE | Admit: 2021-01-30 | Discharge: 2021-01-30 | Disposition: A | Discharge status: Home / Self Care | Payer: PRIVATE HEALTH INSURANCE | Source: Ambulatory Visit | Attending: Radiation Oncology | Admitting: Radiation Oncology

## 2021-01-30 ENCOUNTER — Other Ambulatory Visit: Payer: Self-pay

## 2021-01-30 ENCOUNTER — Encounter: Payer: Self-pay | Admitting: Radiation Oncology

## 2021-01-30 VITALS — BP 122/91 | HR 100 | Temp 97.8°F | Wt 183.7 lb

## 2021-01-30 DIAGNOSIS — K219 Gastro-esophageal reflux disease without esophagitis: Secondary | ICD-10-CM | POA: Insufficient documentation

## 2021-01-30 DIAGNOSIS — R5383 Other fatigue: Secondary | ICD-10-CM | POA: Diagnosis not present

## 2021-01-30 DIAGNOSIS — Z800 Family history of malignant neoplasm of digestive organs: Secondary | ICD-10-CM | POA: Insufficient documentation

## 2021-01-30 DIAGNOSIS — Z170 Estrogen receptor positive status [ER+]: Secondary | ICD-10-CM | POA: Diagnosis not present

## 2021-01-30 DIAGNOSIS — Z803 Family history of malignant neoplasm of breast: Secondary | ICD-10-CM | POA: Diagnosis not present

## 2021-01-30 DIAGNOSIS — Z79899 Other long term (current) drug therapy: Secondary | ICD-10-CM | POA: Insufficient documentation

## 2021-01-30 DIAGNOSIS — Z9221 Personal history of antineoplastic chemotherapy: Secondary | ICD-10-CM | POA: Insufficient documentation

## 2021-01-30 DIAGNOSIS — M797 Fibromyalgia: Secondary | ICD-10-CM | POA: Insufficient documentation

## 2021-01-30 DIAGNOSIS — C50211 Malignant neoplasm of upper-inner quadrant of right female breast: Secondary | ICD-10-CM | POA: Insufficient documentation

## 2021-01-30 NOTE — Consult Note (Signed)
NEW PATIENT EVALUATION  Name: Alexandra Warren  MRN: [id number]  Date:   01/30/2021     DOB: February 08, 1980   This 41 y.o. 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  REFERRING PHYSICIAN: [name], MD  CHIEF COMPLAINT:  Chief Complaint  Patient presents with   Breast Cancer    DIAGNOSIS: The encounter diagnosis was Carcinoma of upper-inner quadrant of right breast in female, estrogen receptor positive ([place]).   PREVIOUS INVESTIGATIONS:  Mammograms and ultrasound and MRI scans reviewed Clinical notes reviewed Pathology report reviewed  HPI: Patient is a 41 year old female who presented with an abnormal mammogram of her right breast showing a indeterminate 0.8 cm mass in the right breast at the 2 o'clock position in the retroareolar position.  This was confirmed on ultrasound and ultrasound showed no evidence of abnormality in the axilla.  MRI scan of her right breast showed biopsy-proven malignancy within the 2 o'clock position of the right breast measuring approximate 1.1 cm with no evidence of metastatic disease.  Ultrasound-guided biopsy was positive for triple positive invasive mammary carcinoma.  She underwent a wide local excision for a 0.9 cm grade 2 invasive mammary carcinoma.  Margins were clear but distant to closest margin cannot be calculated.  5 lymph nodes were examined all negative for metastatic disease.  Again tumor was HER2/neu positive ER/PR positive.  She did have an MRI some abnormality in upper outer quadrant of the left breast although this was biopsy negative.  Genetic testing was negative for any hereditary cancer variants.  She is currently under treatment with Taxol as well as trastuzumab.  She will finish her Taxol a in approximately 4 weeks.   She has been having some significant fatigue from chemotherapy.  She specifically denies breast tenderness cough or bone pain.  PLANNED TREATMENT REGIMEN: Right hypofractionated whole breast radiation  PAST MEDICAL HISTORY:  has a past medical history of Acid reflux, Anxiety and depression, Back pain, Family history of breast cancer (09/21/2020), Family history of malignant neoplasm of digestive organ (09/21/2020), Fibromyalgia, History of gallstones, Malignant neoplasm of upper-inner quadrant of right female breast ([place]) (09/21/2020), and Migraine.    PAST SURGICAL HISTORY:  Past Surgical History:  Procedure Laterality Date   BREAST LUMPECTOMY WITH RADIOACTIVE SEED LOCALIZATION Right 10/17/2020   Procedure: RIGHT BREAST LUMPECTOMY WITH RADIOACTIVE SEED LOCALIZATION WITH RIGHT SENTINEL LYMPH NODE BIOPSY;  Surgeon: Stark Klein, MD;  Location: [facility];  Service: General;  Laterality: Right;   CHOLECYSTECTOMY  12/2004   PORTACATH PLACEMENT Left 10/17/2020   Procedure: INSERTION PORT-A-CATH;  Surgeon: Stark Klein, MD;  Location: [facility];  Service: General;  Laterality: Left;    FAMILY HISTORY: family history includes Breast cancer (age of onset: 59) in her cousin; Cancer (age of onset: 91) in her paternal grandmother; Cervical cancer in her maternal aunt; Colon cancer in an other family member; Colon cancer (age of onset: 39) in her maternal aunt; Gastric cancer in an other family member; Healthy in her mother; Hypertension in her father; Leukemia in an other family member; Lung cancer in her paternal grandfather; Skin cancer in her brother.  SOCIAL HISTORY:  reports that she has never smoked. She has never used smokeless tobacco. She reports that she does not currently use alcohol. She reports that she does not use  drugs.  ALLERGIES: Antihistamines, diphenhydramine-type; Diphenhydramine hcl; Promethazine; and Pseudoeph-doxylamine-dm-apap  MEDICATIONS:  Current Outpatient Medications  Medication Sig  Dispense Refill   almotriptan (AXERT) 12.5 MG tablet Take 1 tab at onset of migraine.  May repeat in 2 hrs, if needed.  Max dose: 2 tabs/day. This is a 30 day prescription. 12 tablet 11   azelastine (ASTELIN) 0.1 % nasal spray Place 1 spray into both nostrils daily as needed for rhinitis.     BOTOX 200 units SOLR Inject 200 Units into the skin See admin instructions. Every 90 days     butalbital-acetaminophen-caffeine (FIORICET) 50-325-40 MG tablet Take 1 tablet by mouth every 8 (eight) hours as needed for headache. 10 tablet 5   clindamycin (CLEOCIN T) 1 % external solution Apply topically 2 (two) times daily as needed.     clonazePAM (KLONOPIN) 1 MG tablet Take 0.5-1 mg by mouth 2 (two) times daily as needed for anxiety.     diazepam (VALIUM) 5 MG tablet One pill 45-60 mins prior to procedure; 1 pill 15 mins prior if needed. 6 tablet 0   EMGALITY 120 MG/ML SOAJ Inject 120 mg into the skin every 30 (thirty) days. 3 mL 4   fluconazole (DIFLUCAN) 100 MG tablet TAKE 1 TABLET(100 MG) BY MOUTH DAILY AS NEEDED FOR YEAST INFECTION 6 tablet 0   lidocaine-prilocaine (EMLA) cream Apply 1 application topically as needed. 30 g 4   omeprazole (PRILOSEC) 20 MG capsule Take 20 mg by mouth daily.     ondansetron (ZOFRAN) 8 MG tablet Take 1 tablet (8 mg total) by mouth every 8 (eight) hours as needed for nausea or vomiting. 30 tablet 3   predniSONE (DELTASONE) 10 MG tablet Take 1 tablet (10 mg total) by mouth daily with breakfast. 30 tablet 0   prochlorperazine (COMPAZINE) 10 MG tablet Take 1 tablet (10 mg total) by mouth every 6 (six) hours as needed for nausea or vomiting. 40 tablet 1   Rimegepant Sulfate (NURTEC) 75 MG TBDP Take 75 mg by mouth as needed (Take 1 at onset of headache, max is 75 mg in 24 hours). 8 tablet 11   rosuvastatin (CRESTOR) 20 MG tablet Take 20 mg by mouth daily.     tiZANidine (ZANAFLEX) 4 MG tablet Take 1 tablet (4 mg total) by mouth every 8 (eight) hours as needed for muscle spasms  (migraines). This is a 30 day rx. 20 tablet 5   topiramate (TOPAMAX) 100 MG tablet Take 1 tablet (100 mg total) by mouth 2 (two) times daily. (Patient taking differently: Take 200 mg by mouth at bedtime.) 60 tablet 11   traMADol (ULTRAM) 50 MG tablet Take 50 mg by mouth every 6 (six) hours as needed for pain.     vortioxetine HBr (TRINTELLIX) 20 MG TABS tablet Take 20 mg by mouth daily.  0   zolpidem (AMBIEN) 10 MG tablet Take 10 mg by mouth at bedtime as needed for sleep.     No current facility-administered medications for this encounter.    ECOG PERFORMANCE STATUS:  0 - Asymptomatic  REVIEW OF SYSTEMS: Patient denies any weight loss, fatigue, weakness, fever, chills or night sweats. Patient denies any loss of vision, blurred vision. Patient denies any ringing  of the ears or hearing loss. No irregular heartbeat. Patient denies heart murmur or history of fainting. Patient denies any chest pain or pain radiating to her upper extremities. Patient denies any shortness of breath, difficulty breathing at night, cough or hemoptysis. Patient denies any swelling  in the lower legs. Patient denies any nausea vomiting, vomiting of blood, or coffee ground material in the vomitus. Patient denies any stomach pain. Patient states has had normal bowel movements no significant constipation or diarrhea. Patient denies any dysuria, hematuria or significant nocturia. Patient denies any problems walking, swelling in the joints or loss of balance. Patient denies any skin changes, loss of hair or loss of weight. Patient denies any excessive worrying or anxiety or significant depression. Patient denies any problems with insomnia. Patient denies excessive thirst, polyuria, polydipsia. Patient denies any swollen glands, patient denies easy bruising or easy bleeding. Patient denies any recent infections, allergies or URI. Patient "s visual fields have not changed significantly in recent time.   PHYSICAL EXAM: BP (!) 122/91     Pulse 100    Temp 97.8 F (36.6 C) (Tympanic)    Wt 183 lb 11.2 oz (83.3 kg)    LMP 01/25/2021    BMI 29.65 kg/m  She is status post wide local excision around the right breast at the nipple areolar complex.Incision is well-healed no dominant masses noted in either breast.  No axillary or supraclavicular adenopathy is appreciated.  Well-developed well-nourished patient in NAD. HEENT reveals PERLA, EOMI, discs not visualized.  Oral cavity is clear. No oral mucosal lesions are identified. Neck is clear without evidence of cervical or supraclavicular adenopathy. Lungs are clear to A&P. Cardiac examination is essentially unremarkable with regular rate and rhythm without murmur rub or thrill. Abdomen is benign with no organomegaly or masses noted. Motor sensory and DTR levels are equal and symmetric in the upper and lower extremities. Cranial nerves II through XII are grossly intact. Proprioception is intact. No peripheral adenopathy or edema is identified. No motor or sensory levels are noted. Crude visual fields are within normal range.  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 HEENT reveals PERLA, EOMI, discs not visualized.  Oral cavity is clear. No oral mucosal lesions are identified. Neck is clear without evidence of cervical or supraclavicular adenopathy. Lungs are clear to A&P. Cardiac examination is essentially unremarkable with regular rate and rhythm without murmur rub or thrill. Abdomen is benign with no organomegaly or masses noted. Motor sensory and DTR levels are equal and symmetric in the upper and lower extremities. Cranial nerves II through XII  are grossly intact. Proprioception is intact. No peripheral adenopathy or edema is identified. No motor or sensory levels are noted. Crude visual fields are within normal range.  PLAN: Present time patient will complete her Taxol chemotherapy in about 1 month's time.  I have tentatively set her up for simulation at the end of February.  Would plan on delivering 3 weeks of hypofractionated whole breast radiation.  Would boost her scar another 1600 centigrade based on the inability to establish a clear margin.  Risks and benefits of treatment occluding skin reaction fatigue alteration of blood counts possible occlusion of superficial lung all were discussed in detail with the patient and her husband.  Both seem to comprehend her treatment plan well.  Should to be any delay in her chemotherapy we will adjust her simulation time.  I would like to take this opportunity to thank you for allowing me to participate in the  care of your patient.Noreene Filbert, MD

## 2021-02-01 ENCOUNTER — Inpatient Hospital Stay: Payer: PRIVATE HEALTH INSURANCE

## 2021-02-01 ENCOUNTER — Other Ambulatory Visit: Payer: Self-pay

## 2021-02-01 ENCOUNTER — Ambulatory Visit: Payer: PRIVATE HEALTH INSURANCE | Admitting: Internal Medicine

## 2021-02-01 VITALS — BP 117/86 | HR 99 | Temp 98.1°F | Resp 18 | Ht 66.0 in | Wt 180.6 lb

## 2021-02-01 DIAGNOSIS — C50211 Malignant neoplasm of upper-inner quadrant of right female breast: Secondary | ICD-10-CM

## 2021-02-01 DIAGNOSIS — Z170 Estrogen receptor positive status [ER+]: Secondary | ICD-10-CM

## 2021-02-01 DIAGNOSIS — Z5111 Encounter for antineoplastic chemotherapy: Secondary | ICD-10-CM | POA: Diagnosis not present

## 2021-02-01 DIAGNOSIS — Z95828 Presence of other vascular implants and grafts: Secondary | ICD-10-CM

## 2021-02-01 LAB — CBC WITH DIFFERENTIAL/PLATELET
Abs Immature Granulocytes: 0.07 10*3/uL (ref 0.00–0.07)
Basophils Absolute: 0.1 10*3/uL (ref 0.0–0.1)
Basophils Relative: 1 %
Eosinophils Absolute: 0.1 10*3/uL (ref 0.0–0.5)
Eosinophils Relative: 1 %
HCT: 40.8 % (ref 36.0–46.0)
Hemoglobin: 13.8 g/dL (ref 12.0–15.0)
Immature Granulocytes: 1 %
Lymphocytes Relative: 22 %
Lymphs Abs: 1.9 10*3/uL (ref 0.7–4.0)
MCH: 32 pg (ref 26.0–34.0)
MCHC: 33.8 g/dL (ref 30.0–36.0)
MCV: 94.7 fL (ref 80.0–100.0)
Monocytes Absolute: 0.4 10*3/uL (ref 0.1–1.0)
Monocytes Relative: 4 %
Neutro Abs: 6.5 10*3/uL (ref 1.7–7.7)
Neutrophils Relative %: 71 %
Platelets: 354 10*3/uL (ref 150–400)
RBC: 4.31 MIL/uL (ref 3.87–5.11)
RDW: 13.1 % (ref 11.5–15.5)
WBC: 9 10*3/uL (ref 4.0–10.5)
nRBC: 0 % (ref 0.0–0.2)

## 2021-02-01 LAB — COMPREHENSIVE METABOLIC PANEL
ALT: 59 U/L — ABNORMAL HIGH (ref 0–44)
AST: 31 U/L (ref 15–41)
Albumin: 4.5 g/dL (ref 3.5–5.0)
Alkaline Phosphatase: 53 U/L (ref 38–126)
Anion gap: 7 (ref 5–15)
BUN: 14 mg/dL (ref 6–20)
CO2: 21 mmol/L — ABNORMAL LOW (ref 22–32)
Calcium: 9.1 mg/dL (ref 8.9–10.3)
Chloride: 107 mmol/L (ref 98–111)
Creatinine, Ser: 0.57 mg/dL (ref 0.44–1.00)
GFR, Estimated: >60 mL/min (ref >60)
Glucose, Bld: 121 mg/dL — ABNORMAL HIGH (ref 70–99)
Potassium: 3.8 mmol/L (ref 3.5–5.1)
Sodium: 135 mmol/L (ref 135–145)
Total Bilirubin: 0.4 mg/dL (ref 0.3–1.2)
Total Protein: 7.5 g/dL (ref 6.5–8.1)

## 2021-02-01 LAB — PREGNANCY, URINE: Preg Test, Ur: NEGATIVE

## 2021-02-01 MED ORDER — SODIUM CHLORIDE 0.9 % IV SOLN
10.0000 mg | Freq: Once | INTRAVENOUS | Status: AC
  Administered 2021-02-01: 10 mg via INTRAVENOUS
  Filled 2021-02-01: qty 10

## 2021-02-01 MED ORDER — SODIUM CHLORIDE 0.9% FLUSH
10.0000 mL | Freq: Once | INTRAVENOUS | Status: AC
  Administered 2021-02-01: 10 mL via INTRAVENOUS
  Filled 2021-02-01: qty 10

## 2021-02-01 MED ORDER — TRASTUZUMAB-DKST CHEMO 150 MG IV SOLR
2.0000 mg/kg | Freq: Once | INTRAVENOUS | Status: AC
  Administered 2021-02-01: 168 mg via INTRAVENOUS
  Filled 2021-02-01: qty 8

## 2021-02-01 MED ORDER — DIPHENHYDRAMINE HCL 50 MG/ML IJ SOLN
50.0000 mg | Freq: Once | INTRAMUSCULAR | Status: AC
  Administered 2021-02-01: 50 mg via INTRAVENOUS
  Filled 2021-02-01: qty 1

## 2021-02-01 MED ORDER — ACETAMINOPHEN 325 MG PO TABS
650.0000 mg | ORAL_TABLET | Freq: Once | ORAL | Status: AC
  Administered 2021-02-01: 650 mg via ORAL
  Filled 2021-02-01: qty 2

## 2021-02-01 MED ORDER — HEPARIN SOD (PORK) LOCK FLUSH 100 UNIT/ML IV SOLN
500.0000 [IU] | Freq: Once | INTRAVENOUS | Status: AC | PRN
  Administered 2021-02-01: 500 [IU]
  Filled 2021-02-01: qty 5

## 2021-02-01 MED ORDER — FAMOTIDINE IN NACL 20-0.9 MG/50ML-% IV SOLN
20.0000 mg | Freq: Once | INTRAVENOUS | Status: AC
  Administered 2021-02-01: 20 mg via INTRAVENOUS
  Filled 2021-02-01: qty 50

## 2021-02-01 MED ORDER — SODIUM CHLORIDE 0.9 % IV SOLN
Freq: Once | INTRAVENOUS | Status: AC
  Filled 2021-02-01: qty 250

## 2021-02-01 MED ORDER — SODIUM CHLORIDE 0.9 % IV SOLN
80.0000 mg/m2 | Freq: Once | INTRAVENOUS | Status: AC
  Administered 2021-02-01: 156 mg via INTRAVENOUS
  Filled 2021-02-01: qty 26

## 2021-02-01 NOTE — Patient Instructions (Signed)
[facility] AT [place]  Discharge Instructions: Thank you for choosing [facility] to provide your oncology and hematology care.  If you have a lab appointment with the [facility], please go directly to the [facility] and check in at the registration area.  Wear comfortable clothing and clothing appropriate for easy access to any Portacath or PICC line.   We strive to give you quality time with your provider. You may need to reschedule your appointment if you arrive late (15 or more minutes).  Arriving late affects you and other patients whose appointments are after yours.  Also, if you miss three or more appointments without notifying the office, you may be dismissed from the clinic at the providers discretion.      For prescription refill requests, have your pharmacy contact our office and allow 72 hours for refills to be completed.    Today you received the following chemotherapy and/or immunotherapy agents [name] and TAXOL      To help prevent nausea and vomiting after your treatment, we encourage you to take your nausea medication as directed.  BELOW ARE SYMPTOMS THAT SHOULD BE REPORTED IMMEDIATELY: *FEVER GREATER THAN 100.4 F (38 C) OR HIGHER *CHILLS OR SWEATING *NAUSEA AND VOMITING THAT IS NOT CONTROLLED WITH YOUR NAUSEA MEDICATION *UNUSUAL SHORTNESS OF BREATH *UNUSUAL BRUISING OR BLEEDING *URINARY PROBLEMS (pain or burning when urinating, or frequent urination) *BOWEL PROBLEMS (unusual diarrhea, constipation, pain near the anus) TENDERNESS IN MOUTH AND THROAT WITH OR WITHOUT PRESENCE OF ULCERS (sore throat, sores in mouth, or a toothache) UNUSUAL RASH, SWELLING OR PAIN  UNUSUAL VAGINAL DISCHARGE OR ITCHING   Items with * indicate a potential emergency and should be followed up as soon as possible or go to the Emergency Department if any problems should occur.  Please show the CHEMOTHERAPY ALERT CARD or IMMUNOTHERAPY ALERT CARD at  check-in to the Emergency Department and triage nurse.  Should you have questions after your visit or need to cancel or reschedule your appointment, please contact [facility] [place] AT [place]  [phone] and follow the prompts.  Office hours are 8:00 a.m. to 4:30 p.m. Monday - Friday. Please note that voicemails left after 4:00 p.m. may not be returned until the following business day.  We are closed weekends and major holidays. You have access to a nurse at all times for urgent questions. Please call the main number to the clinic [phone] and follow the prompts.  For any non-urgent questions, you may also contact your provider using MyChart. We now offer e-Visits for anyone 22 and older to request care online for non-urgent symptoms. For details visit [email or web address].   Also download the MyChart app! Go to the app store, search "MyChart", open the app, select [facility], and log in with your MyChart username and password.  Due to Covid, a mask is required upon entering the hospital/clinic. If you do not have a mask, one will be given to you upon arrival. For doctor visits, patients may have 1 support person aged 65 or older with them. For treatment visits, patients cannot have anyone with them due to current Covid guidelines and our immunocompromised population.   Trastuzumab injection for infusion What is this medication? TRASTUZUMAB (tras TOO zoo mab) is a monoclonal antibody. It is used to treat breast cancer and stomach cancer. This medicine may be used for other purposes; ask your health care provider or pharmacist if you have questions. COMMON BRAND NAME(S): [name], [name], [name],  [name] What should I tell my care team before I take this medication? They need to know if you have any of these conditions: heart disease heart failure lung or breathing disease, like asthma an unusual or allergic reaction to trastuzumab, benzyl  alcohol, or other medications, foods, dyes, or preservatives pregnant or trying to get pregnant breast-feeding How should I use this medication? This drug is given as an infusion into a vein. It is administered in a hospital or clinic by a specially trained health care professional. Talk to your pediatrician regarding the use of this medicine in children. This medicine is not approved for use in children. Overdosage: If you think you have taken too much of this medicine contact a poison control center or emergency room at once. NOTE: This medicine is only for you. Do not share this medicine with others. What if I miss a dose? It is important not to miss a dose. Call your doctor or health care professional if you are unable to keep an appointment. What may interact with this medication? This medicine may interact with the following medications: certain types of chemotherapy, such as daunorubicin, doxorubicin, epirubicin, and idarubicin This list may not describe all possible interactions. Give your health care provider a list of all the medicines, herbs, non-prescription drugs, or dietary supplements you use. Also tell them if you smoke, drink alcohol, or use illegal drugs. Some items may interact with your medicine. What should I watch for while using this medication? Visit your doctor for checks on your progress. Report any side effects. Continue your course of treatment even though you feel ill unless your doctor tells you to stop. Call your doctor or health care professional for advice if you get a fever, chills or sore throat, or other symptoms of a cold or flu. Do not treat yourself. Try to avoid being around people who are sick. You may experience fever, chills and shaking during your first infusion. These effects are usually mild and can be treated with other medicines. Report any side effects during the infusion to your health care professional. Fever and chills usually do not happen with  later infusions. Do not become pregnant while taking this medicine or for 7 months after stopping it. Women should inform their doctor if they wish to become pregnant or think they might be pregnant. Women of child-bearing potential will need to have a negative pregnancy test before starting this medicine. There is a potential for serious side effects to an unborn child. Talk to your health care professional or pharmacist for more information. Do not breast-feed an infant while taking this medicine or for 7 months after stopping it. Women must use effective birth control with this medicine. What side effects may I notice from receiving this medication? Side effects that you should report to your doctor or health care professional as soon as possible: allergic reactions like skin rash, itching or hives, swelling of the face, lips, or tongue chest pain or palpitations cough dizziness feeling faint or lightheaded, falls fever general ill feeling or flu-like symptoms signs of worsening heart failure like breathing problems; swelling in your legs and feet unusually weak or tired Side effects that usually do not require medical attention (report to your doctor or health care professional if they continue or are bothersome): bone pain changes in taste diarrhea joint pain nausea/vomiting weight loss This list may not describe all possible side effects. Call your doctor for medical advice about side effects. You may report side  effects to FDA at 1-800-FDA-1088. Where should I keep my medication? This drug is given in a hospital or clinic and will not be stored at home. NOTE: This sheet is a summary. It may not cover all possible information. If you have questions about this medicine, talk to your doctor, pharmacist, or health care provider.  2022 Elsevier/Gold Standard (2016-01-22 00:00:00)  Paclitaxel injection What is this medication? PACLITAXEL (PAK li TAX el) is a chemotherapy drug. It  targets fast dividing cells, like cancer cells, and causes these cells to die. This medicine is used to treat ovarian cancer, breast cancer, lung cancer, Kaposi's sarcoma, and other cancers. This medicine may be used for other purposes; ask your health care provider or pharmacist if you have questions. COMMON BRAND NAME(S): Onxol, Taxol What should I tell my care team before I take this medication? They need to know if you have any of these conditions: history of irregular heartbeat liver disease low blood counts, like low white cell, platelet, or red cell counts lung or breathing disease, like asthma tingling of the fingers or toes, or other nerve disorder an unusual or allergic reaction to paclitaxel, alcohol, polyoxyethylated castor oil, other chemotherapy, other medicines, foods, dyes, or preservatives pregnant or trying to get pregnant breast-feeding How should I use this medication? This drug is given as an infusion into a vein. It is administered in a hospital or clinic by a specially trained health care professional. Talk to your pediatrician regarding the use of this medicine in children. Special care may be needed. Overdosage: If you think you have taken too much of this medicine contact a poison control center or emergency room at once. NOTE: This medicine is only for you. Do not share this medicine with others. What if I miss a dose? It is important not to miss your dose. Call your doctor or health care professional if you are unable to keep an appointment. What may interact with this medication? Do not take this medicine with any of the following medications: live virus vaccines This medicine may also interact with the following medications: antiviral medicines for hepatitis, HIV or AIDS certain antibiotics like erythromycin and clarithromycin certain medicines for fungal infections like ketoconazole and itraconazole certain medicines for seizures like carbamazepine,  phenobarbital, phenytoin gemfibrozil nefazodone rifampin St. John's wort This list may not describe all possible interactions. Give your health care provider a list of all the medicines, herbs, non-prescription drugs, or dietary supplements you use. Also tell them if you smoke, drink alcohol, or use illegal drugs. Some items may interact with your medicine. What should I watch for while using this medication? Your condition will be monitored carefully while you are receiving this medicine. You will need important blood work done while you are taking this medicine. This medicine can cause serious allergic reactions. To reduce your risk you will need to take other medicine(s) before treatment with this medicine. If you experience allergic reactions like skin rash, itching or hives, swelling of the face, lips, or tongue, tell your doctor or health care professional right away. In some cases, you may be given additional medicines to help with side effects. Follow all directions for their use. This drug may make you feel generally unwell. This is not uncommon, as chemotherapy can affect healthy cells as well as cancer cells. Report any side effects. Continue your course of treatment even though you feel ill unless your doctor tells you to stop. Call your doctor or health care professional for advice if  you get a fever, chills or sore throat, or other symptoms of a cold or flu. Do not treat yourself. This drug decreases your body's ability to fight infections. Try to avoid being around people who are sick. This medicine may increase your risk to bruise or bleed. Call your doctor or health care professional if you notice any unusual bleeding. Be careful brushing and flossing your teeth or using a toothpick because you may get an infection or bleed more easily. If you have any dental work done, tell your dentist you are receiving this medicine. Avoid taking products that contain aspirin, acetaminophen,  ibuprofen, naproxen, or ketoprofen unless instructed by your doctor. These medicines may hide a fever. Do not become pregnant while taking this medicine. Women should inform their doctor if they wish to become pregnant or think they might be pregnant. There is a potential for serious side effects to an unborn child. Talk to your health care professional or pharmacist for more information. Do not breast-feed an infant while taking this medicine. Men are advised not to father a child while receiving this medicine. This product may contain alcohol. Ask your pharmacist or healthcare provider if this medicine contains alcohol. Be sure to tell all healthcare providers you are taking this medicine. Certain medicines, like metronidazole and disulfiram, can cause an unpleasant reaction when taken with alcohol. The reaction includes flushing, headache, nausea, vomiting, sweating, and increased thirst. The reaction can last from 30 minutes to several hours. What side effects may I notice from receiving this medication? Side effects that you should report to your doctor or health care professional as soon as possible: allergic reactions like skin rash, itching or hives, swelling of the face, lips, or tongue breathing problems changes in vision fast, irregular heartbeat high or low blood pressure mouth sores pain, tingling, numbness in the hands or feet signs of decreased platelets or bleeding - bruising, pinpoint red spots on the skin, black, tarry stools, blood in the urine signs of decreased red blood cells - unusually weak or tired, feeling faint or lightheaded, falls signs of infection - fever or chills, cough, sore throat, pain or difficulty passing urine signs and symptoms of liver injury like dark yellow or brown urine; general ill feeling or flu-like symptoms; light-colored stools; loss of appetite; nausea; right upper belly pain; unusually weak or tired; yellowing of the eyes or skin swelling of the  ankles, feet, hands unusually slow heartbeat Side effects that usually do not require medical attention (report to your doctor or health care professional if they continue or are bothersome): diarrhea hair loss loss of appetite muscle or joint pain nausea, vomiting pain, redness, or irritation at site where injected tiredness This list may not describe all possible side effects. Call your doctor for medical advice about side effects. You may report side effects to FDA at 1-800-FDA-1088. Where should I keep my medication? This drug is given in a hospital or clinic and will not be stored at home. NOTE: This sheet is a summary. It may not cover all possible information. If you have questions about this medicine, talk to your doctor, pharmacist, or health care provider.  2022 Elsevier/Gold Standard (2020-09-25 00:00:00)

## 2021-02-02 ENCOUNTER — Encounter: Payer: Self-pay | Admitting: Neurology

## 2021-02-05 ENCOUNTER — Other Ambulatory Visit: Payer: Self-pay

## 2021-02-05 MED ORDER — EMGALITY 120 MG/ML ~~LOC~~ SOAJ: 120.0000 mg | SUBCUTANEOUS | 4 refills | Status: DC

## 2021-02-05 NOTE — Addendum Note (Signed)
Addended by: Andre Lefort on: 02/05/2021 04:30 PM   Modules accepted: Orders

## 2021-02-08 ENCOUNTER — Inpatient Hospital Stay: Payer: PRIVATE HEALTH INSURANCE

## 2021-02-08 ENCOUNTER — Inpatient Hospital Stay (HOSPITAL_BASED_OUTPATIENT_CLINIC_OR_DEPARTMENT_OTHER): Payer: PRIVATE HEALTH INSURANCE | Admitting: Internal Medicine

## 2021-02-08 ENCOUNTER — Encounter: Payer: Self-pay | Admitting: Neurology

## 2021-02-08 ENCOUNTER — Encounter: Payer: Self-pay | Admitting: Internal Medicine

## 2021-02-08 ENCOUNTER — Other Ambulatory Visit: Payer: Self-pay

## 2021-02-08 DIAGNOSIS — C50211 Malignant neoplasm of upper-inner quadrant of right female breast: Secondary | ICD-10-CM

## 2021-02-08 DIAGNOSIS — Z170 Estrogen receptor positive status [ER+]: Secondary | ICD-10-CM

## 2021-02-08 DIAGNOSIS — Z5111 Encounter for antineoplastic chemotherapy: Secondary | ICD-10-CM | POA: Diagnosis not present

## 2021-02-08 LAB — CBC WITH DIFFERENTIAL/PLATELET
Abs Immature Granulocytes: 0.09 10*3/uL — ABNORMAL HIGH (ref 0.00–0.07)
Basophils Absolute: 0.1 10*3/uL (ref 0.0–0.1)
Basophils Relative: 2 %
Eosinophils Absolute: 0.2 10*3/uL (ref 0.0–0.5)
Eosinophils Relative: 2 %
HCT: 38.1 % (ref 36.0–46.0)
Hemoglobin: 12.4 g/dL (ref 12.0–15.0)
Immature Granulocytes: 1 %
Lymphocytes Relative: 49 %
Lymphs Abs: 3.4 10*3/uL (ref 0.7–4.0)
MCH: 31.9 pg (ref 26.0–34.0)
MCHC: 32.5 g/dL (ref 30.0–36.0)
MCV: 97.9 fL (ref 80.0–100.0)
Monocytes Absolute: 0.4 10*3/uL (ref 0.1–1.0)
Monocytes Relative: 6 %
Neutro Abs: 2.8 10*3/uL (ref 1.7–7.7)
Neutrophils Relative %: 40 %
Platelets: 389 10*3/uL (ref 150–400)
RBC: 3.89 MIL/uL (ref 3.87–5.11)
RDW: 13.2 % (ref 11.5–15.5)
WBC: 7 10*3/uL (ref 4.0–10.5)
nRBC: 0 % (ref 0.0–0.2)

## 2021-02-08 LAB — COMPREHENSIVE METABOLIC PANEL
ALT: 46 U/L — ABNORMAL HIGH (ref 0–44)
AST: 32 U/L (ref 15–41)
Albumin: 4.1 g/dL (ref 3.5–5.0)
Alkaline Phosphatase: 45 U/L (ref 38–126)
Anion gap: 8 (ref 5–15)
BUN: 10 mg/dL (ref 6–20)
CO2: 21 mmol/L — ABNORMAL LOW (ref 22–32)
Calcium: 8.9 mg/dL (ref 8.9–10.3)
Chloride: 110 mmol/L (ref 98–111)
Creatinine, Ser: 0.72 mg/dL (ref 0.44–1.00)
GFR, Estimated: >60 mL/min (ref >60)
Glucose, Bld: 129 mg/dL — ABNORMAL HIGH (ref 70–99)
Potassium: 3.4 mmol/L — ABNORMAL LOW (ref 3.5–5.1)
Sodium: 139 mmol/L (ref 135–145)
Total Bilirubin: 0.4 mg/dL (ref 0.3–1.2)
Total Protein: 6.5 g/dL (ref 6.5–8.1)

## 2021-02-08 LAB — PREGNANCY, URINE: Preg Test, Ur: NEGATIVE

## 2021-02-08 MED ORDER — POTASSIUM CHLORIDE CRYS ER 20 MEQ PO TBCR: EXTENDED_RELEASE_TABLET | ORAL | 3 refills | Status: DC

## 2021-02-08 MED ORDER — DIPHENHYDRAMINE HCL 50 MG/ML IJ SOLN
50.0000 mg | Freq: Once | INTRAMUSCULAR | Status: AC
  Administered 2021-02-08: 50 mg via INTRAVENOUS
  Filled 2021-02-08: qty 1

## 2021-02-08 MED ORDER — ACETAMINOPHEN 325 MG PO TABS
650.0000 mg | ORAL_TABLET | Freq: Once | ORAL | Status: AC
  Administered 2021-02-08: 650 mg via ORAL
  Filled 2021-02-08: qty 2

## 2021-02-08 MED ORDER — SODIUM CHLORIDE 0.9% FLUSH
10.0000 mL | INTRAVENOUS | Status: DC | PRN
  Filled 2021-02-08: qty 10

## 2021-02-08 MED ORDER — SODIUM CHLORIDE 0.9 % IV SOLN
Freq: Once | INTRAVENOUS | Status: AC
  Filled 2021-02-08: qty 250

## 2021-02-08 MED ORDER — SODIUM CHLORIDE 0.9 % IV SOLN
10.0000 mg | Freq: Once | INTRAVENOUS | Status: AC
  Administered 2021-02-08: 10 mg via INTRAVENOUS
  Filled 2021-02-08: qty 10

## 2021-02-08 MED ORDER — SODIUM CHLORIDE 0.9 % IV SOLN
80.0000 mg/m2 | Freq: Once | INTRAVENOUS | Status: AC
  Administered 2021-02-08: 156 mg via INTRAVENOUS
  Filled 2021-02-08: qty 26

## 2021-02-08 MED ORDER — HEPARIN SOD (PORK) LOCK FLUSH 100 UNIT/ML IV SOLN
INTRAVENOUS | Status: AC
  Administered 2021-02-08: 500 [IU]
  Filled 2021-02-08: qty 5

## 2021-02-08 MED ORDER — TRASTUZUMAB-DKST CHEMO 150 MG IV SOLR
2.0000 mg/kg | Freq: Once | INTRAVENOUS | Status: AC
  Administered 2021-02-08: 168 mg via INTRAVENOUS
  Filled 2021-02-08: qty 8

## 2021-02-08 MED ORDER — FAMOTIDINE IN NACL 20-0.9 MG/50ML-% IV SOLN
20.0000 mg | Freq: Once | INTRAVENOUS | Status: AC
  Administered 2021-02-08: 20 mg via INTRAVENOUS
  Filled 2021-02-08: qty 50

## 2021-02-08 MED ORDER — HEPARIN SOD (PORK) LOCK FLUSH 100 UNIT/ML IV SOLN
500.0000 [IU] | Freq: Once | INTRAVENOUS | Status: AC | PRN
  Filled 2021-02-08: qty 5

## 2021-02-08 NOTE — Progress Notes (Signed)
[place] NOTE  Patient Care Team: [name], MD as PCP - General (Family Medicine)  CHIEF COMPLAINTS/PURPOSE OF CONSULTATION: Breast cancer  #  Oncology History Overview Note   #Right breast-invasive mammary carcinoma 2 o'clock position #1.1 cm-ER/PR HER2/neu positive [3+]; SEP, 2022-MRI-up to 3 cm non-mass enhancement noted.  #Left breast- MRI #upper outer quadrant- 13m #retroareolar 1cm-. NEGATIVE for malignancy   EProcedure: Lumpectomy  Specimen Laterality: Right  Histologic Type: Invasive ductal carcinoma  Histologic Grade:       Glandular (Acinar)/Tubular Differentiation: 3       Nuclear Pleomorphism: 2       Mitotic Rate: 1       Overall Grade: 2  Tumor Size: 0.9 cm  Ductal Carcinoma In Situ: Present, intermediate grade  Treatment Effect in the Breast: No known presurgical therapy  Margins: All margins negative for invasive carcinoma       Distance from Closest Margin (mm): Cannot be determined  DCIS Margins: Uninvolved by DCIS       Distance from Closest Margin (mm): Cannot be determined  Regional Lymph Nodes:       Number of Lymph Nodes Examined: 5       Number of Sentinel Nodes Examined: 5       Number of Lymph Nodes with Macrometastases (>2 mm): 0       Number of Lymph Nodes with Micrometastases: 0       Number of Lymph Nodes with Isolated Tumor Cells (=0.2 mm or =200  cells): 0       Size of Largest Metastatic Deposit (mm): Not applicable       Extranodal Extension: Not applicable  Distant Metastasis:       Distant Site(s) Involved: Not applicable  Breast Biomarker Testing Performed on Previous Biopsy:       Testing Performed on Case Number: [id number]             Estrogen Receptor: 95%, positive, strong staining intensity             Progesterone Receptor: 95%, positive, strong staining  intensity             HER2: Positive (3+)             Ki-67: 15%  Pathologic Stage Classification (pTNM, AJCC 8th Edition): pT1b, pN0   Representative Tumor Block: A4  Comment(s): None   # NOV 4th, 2022- TAXOL-[name] q W  ---------------------------------   # Headaches-Migraine/ Anxiety [[name]; Dr.Yan  botox]  #Genetic testing-s/p genetic counseling-expanded panel negative.  #Reproductive counseling: DECLINED reproductive endocrinology.     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  The report dates are October 01, 2020 and October 11, 2020, respectively.    The BRCAplus panel offered by [name] includes sequencing and deletion/duplication analysis for the following 8 genes: ATM, BRCA1, BRCA2, CDH1, CHEK2, PALB2, PTEN, and TP53.  The CustomNext-Cancer+RNAinsight panel offered by [facility] sequencing and rearrangement analysis for the following 47 genes:  APC, ATM, AXIN2, BARD1, BMPR1A, BRCA1, BRCA2, BRIP1, CDH1, CDK4, CDKN2A, CHEK2, DICER1, EPCAM, GREM1, HOXB13, MEN1, MLH1, MSH2, MSH3, MSH6, MUTYH, NBN, NF1, NF2, NTHL1, PALB2, PMS2, POLD1, POLE, PTEN, RAD51C, RAD51D, RECQL, RET, SDHA, SDHAF2,  SDHB, SDHC, SDHD, SMAD4, SMARCA4, STK11, TP53, TSC1, TSC2, and VHL.  RNA data is routinely analyzed for use in variant interpretation for all genes.   11/23/2020 -  Chemotherapy   Patient is on Treatment Plan : BREAST Paclitaxel + Trastuzumab q7d / Trastuzumab q21d        HISTORY OF PRESENTING ILLNESS: Ambulating independently.  Alone.  Alexandra Warren 41 y.o.  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  In the interim evaluated by radiation oncology; plan to start radiation in  February.  Also s/p evaluation by dermatology-using clindamycin gel for skin rash.  Patient complains of continued fatigue.  Complains of myalgias concerned about hypokalemia.  Denies any significant tingling or numbness.  Complains of ongoing diarrhea not any worse.  Migraines are stable.   Review of Systems  Constitutional:  Negative for chills, diaphoresis, fever, malaise/fatigue and weight loss.  HENT:  Negative for nosebleeds and sore throat.   Eyes:  Negative for double vision.  Respiratory:  Negative for cough, hemoptysis, sputum production, shortness of breath and wheezing.   Cardiovascular:  Negative for chest pain, palpitations, orthopnea and leg swelling.  Gastrointestinal:  Negative for abdominal pain, blood in stool, constipation, diarrhea, heartburn, melena, nausea and vomiting.  Genitourinary:  Negative for dysuria, frequency and urgency.  Musculoskeletal:  Positive for myalgias. Negative for back pain and joint pain.  Skin:  Positive for itching and rash.  Neurological:  Positive for headaches. Negative for dizziness, tingling, focal weakness and weakness.  Endo/Heme/Allergies:  Does not bruise/bleed easily.  Psychiatric/Behavioral:  Negative for depression. The patient is nervous/anxious and has insomnia.   All other systems reviewed and are negative.   MEDICAL HISTORY:  Past Medical History:  Diagnosis Date   Acid reflux    Anxiety and depression    Dr. Gala Murdoch, MD   Back pain    Family history of breast cancer 09/21/2020   Family history of malignant neoplasm of digestive organ 09/21/2020   Fibromyalgia    History of gallstones    Malignant neoplasm of upper-inner quadrant of right female breast ([place]) 09/21/2020   Migraine     SURGICAL HISTORY: Past Surgical History:  Procedure Laterality Date   BREAST LUMPECTOMY WITH RADIOACTIVE SEED LOCALIZATION Right 10/17/2020   Procedure: RIGHT BREAST LUMPECTOMY WITH RADIOACTIVE SEED LOCALIZATION WITH RIGHT SENTINEL  LYMPH NODE BIOPSY;  Surgeon: Stark Klein, MD;  Location: [facility];  Service: General;  Laterality: Right;   CHOLECYSTECTOMY  12/2004   PORTACATH PLACEMENT Left 10/17/2020   Procedure: INSERTION PORT-A-CATH;  Surgeon: Stark Klein, MD;  Location: [facility];  Service: General;  Laterality: Left;    SOCIAL HISTORY: Social History   Socioeconomic History   Marital status: Divorced    Spouse name: Corene Cornea   Number of children: 0   Years of education: MS   Highest education level: Not on file  Occupational History   Occupation: Optometrist  Tobacco Use   Smoking status: Never   Smokeless tobacco: Never  Vaping Use   Vaping Use: Never used  Substance and Sexual Activity   Alcohol use: Not Currently    Comment: pt states 1 glass of wine maybe once a month   Drug use: No   Sexual activity: Yes    Birth control/protection: Condom  Other Topics Concern   Not on file  Social History Narrative   Accountant for commercial firm; wants short term disability;  never smoked; 2  drinks a month. Lives in [place] with boy friend [[place] No children. Has one brother    Social Determinants of Radio broadcast assistant Strain: Not on file  Food Insecurity: Not on file  Transportation Needs: Not on file  Physical Activity: Not on file  Stress: Not on file  Social Connections: Not on file  Intimate Partner Violence: Not on file    FAMILY HISTORY: Family History  Problem Relation Age of Onset   Healthy Mother    Hypertension Father    Skin cancer Brother        dx before 44   Colon cancer Maternal Aunt 50   Cervical cancer Maternal Aunt        dx before 47; x2   Cancer Paternal Grandmother 99       unknown primary; ? liver   Lung cancer Paternal Grandfather        dx after 25   Breast cancer Cousin 25       maternal cousin   Colon cancer Other        MGM's sister   Gastric cancer Other        PGM's sister   Leukemia Other        PMG's sister   Rectal cancer Neg Hx     Esophageal cancer Neg Hx     ALLERGIES:  is allergic to antihistamines, diphenhydramine-type; diphenhydramine hcl; promethazine; and pseudoeph-doxylamine-dm-apap.  MEDICATIONS:  Current Outpatient Medications  Medication Sig Dispense Refill   almotriptan (AXERT) 12.5 MG tablet Take 1 tab at onset of migraine.  May repeat in 2 hrs, if needed.  Max dose: 2 tabs/day. This is a 30 day prescription. 12 tablet 11   azelastine (ASTELIN) 0.1 % nasal spray Place 1 spray into both nostrils daily as needed for rhinitis.     BOTOX 200 units SOLR Inject 200 Units into the skin See admin instructions. Every 90 days     butalbital-acetaminophen-caffeine (FIORICET) 50-325-40 MG tablet Take 1 tablet by mouth every 8 (eight) hours as needed for headache. 10 tablet 5   clindamycin (CLEOCIN T) 1 % external solution Apply topically 2 (two) times daily as needed.     clonazePAM (KLONOPIN) 1 MG tablet Take 0.5-1 mg by mouth 2 (two) times daily as needed for anxiety.     diazepam (VALIUM) 5 MG tablet One pill 45-60 mins prior to procedure; 1 pill 15 mins prior if needed. 6 tablet 0   EMGALITY 120 MG/ML SOAJ Inject 120 mg into the skin every 30 (thirty) days. 3 mL 4   fluconazole (DIFLUCAN) 100 MG tablet TAKE 1 TABLET(100 MG) BY MOUTH DAILY AS NEEDED FOR YEAST INFECTION 6 tablet 0   lidocaine-prilocaine (EMLA) cream Apply 1 application topically as needed. 30 g 4   omeprazole (PRILOSEC) 20 MG capsule Take 20 mg by mouth daily.     ondansetron (ZOFRAN) 8 MG tablet Take 1 tablet (8 mg total) by mouth every 8 (eight) hours as needed for nausea or vomiting. 30 tablet 3   potassium chloride SA (KLOR-CON M) 20 MEQ tablet 1 pill twice a day 30 tablet 3   prochlorperazine (COMPAZINE) 10 MG tablet Take 1 tablet (10 mg total) by mouth every 6 (six) hours as needed for nausea or vomiting. 40 tablet 1   Rimegepant Sulfate (NURTEC) 75 MG TBDP Take 75 mg by mouth as needed (Take 1 at onset of headache, max is 75 mg in 24 hours).  8 tablet  11   rosuvastatin (CRESTOR) 20 MG tablet Take 20 mg by mouth daily.     tiZANidine (ZANAFLEX) 4 MG tablet Take 1 tablet (4 mg total) by mouth every 8 (eight) hours as needed for muscle spasms (migraines). This is a 30 day rx. 20 tablet 5   topiramate (TOPAMAX) 100 MG tablet Take 1 tablet (100 mg total) by mouth 2 (two) times daily. (Patient taking differently: Take 200 mg by mouth at bedtime.) 60 tablet 11   traMADol (ULTRAM) 50 MG tablet Take 50 mg by mouth every 6 (six) hours as needed for pain.     vortioxetine HBr (TRINTELLIX) 20 MG TABS tablet Take 20 mg by mouth daily.  0   zolpidem (AMBIEN) 10 MG tablet Take 10 mg by mouth at bedtime as needed for sleep.     predniSONE (DELTASONE) 10 MG tablet Take 1 tablet (10 mg total) by mouth daily with breakfast. (Patient not taking: Reported on 02/08/2021) 30 tablet 0   No current facility-administered medications for this visit.   PHYSICAL EXAMINATION: ECOG PERFORMANCE STATUS: 0 - Asymptomatic  Vitals:   02/08/21 0845  BP: 116/81  Pulse: (!) 104  Resp: 18  Temp: 99.2 F (37.3 C)    Filed Weights   02/08/21 0845  Weight: 183 lb (83 kg)   Papular macular rash noted on the scalp.  Physical Exam Vitals and nursing note reviewed.  HENT:     Head: Normocephalic and atraumatic.     Mouth/Throat:     Pharynx: Oropharynx is clear.  Eyes:     Extraocular Movements: Extraocular movements intact.     Pupils: Pupils are equal, round, and reactive to light.  Cardiovascular:     Rate and Rhythm: Normal rate and regular rhythm.  Abdominal:     Palpations: Abdomen is soft.  Musculoskeletal:        General: Normal range of motion.     Cervical back: Normal range of motion.  Skin:    General: Skin is warm.  Neurological:     General: No focal deficit present.     Mental Status: She is alert and oriented to person, place, and time.  Psychiatric:        Behavior: Behavior normal.        Judgment: Judgment normal.      LABORATORY DATA:  I have reviewed the data as listed Lab Results  Component Value Date   WBC 7.0 02/08/2021   HGB 12.4 02/08/2021   HCT 38.1 02/08/2021   MCV 97.9 02/08/2021   PLT 389 02/08/2021   Recent Labs    01/25/21 0825 02/01/21 0859 02/08/21 0827  NA 137 135 139  K 3.5 3.8 3.4*  CL 108 107 110  CO2 21* 21* 21*  GLUCOSE 112* 121* 129*  BUN $Re'9 14 10  'CFa$ CREATININE 0.87 0.57 0.72  CALCIUM 8.8* 9.1 8.9  GFRNONAA >60 >60 >60  PROT 7.0 7.5 6.5  ALBUMIN 4.1 4.5 4.1  AST 33 31 32  ALT 65* 59* 46*  ALKPHOS 48 53 45  BILITOT 0.5 0.4 0.4    RADIOGRAPHIC STUDIES: I have personally reviewed the radiological images as listed and agreed with the findings in the report. No results found.  ASSESSMENT & PLAN:   Carcinoma of upper-inner quadrant of right breast in female, estrogen receptor positive ([place]) # STAGE-I- Right breast-invasive mammary carcinoma- pT1bN0 [55mm]- ER/PR-Positive; Her-2 positive.Proceed with Taxol [name]-weekly x12 followed by [name] every 3 weeks for 12 months.OCT 2022-  MUGA scan-  58%.s/p eval with Dr.Crystal.   # Proceed with weekly Taxol-[name] #3-day 8 [2 more- last/in 2 weeks]  today today. Labs today reviewed;  acceptable for treatment today.   # Mild Hypokalemia-myalgia-  3.4- rcommend Kdur 20 BID;   # AST elevation-49- G-1 from Taxol- monitor fo now.  Stable.  # Nausea- no vomiting-improved with antiemetics-STABLE   # Diarrhea- G-1-2; immodium- STABLE   # Headaches/Migraines-on Fioricet/amlotriptan; and also Emgalti SQ.[? Low K]-STABLE   # Fatigue/ Fibromyalgia- ok to proceed with  Injections/ortho.STABLE   # Folliculitis- Rash on scalp: Secondary to Taxol. S/p eval with Dermatology [Dr.Kim]  vaginal yeast infection/inframammary fungal dermatitis-recommend Diflucan as needed/clindamycin topical gel- derma; STABLE.   # DISPOSITION: # chemo today.  # 1 week- labs- cbc/cmp-Taxol-[name] #  2 weeks-  MD-; labs- cbc/cmp;  taxol-[name] weekly-Dr.B   All questions were answered. The patient/family knows to call the clinic with any problems, questions or concerns.     Cammie Sickle, MD 02/08/2021 9:18 AM

## 2021-02-08 NOTE — Progress Notes (Signed)
Patient denies new problems/concerns today.  HR is 104.

## 2021-02-08 NOTE — Patient Instructions (Signed)
[facility] AT [place]  Discharge Instructions: Thank you for choosing [facility] to provide your oncology and hematology care.  If you have a lab appointment with the [facility], please go directly to the [facility] and check in at the registration area.  Wear comfortable clothing and clothing appropriate for easy access to any Portacath or PICC line.   We strive to give you quality time with your provider. You may need to reschedule your appointment if you arrive late (15 or more minutes).  Arriving late affects you and other patients whose appointments are after yours.  Also, if you miss three or more appointments without notifying the office, you may be dismissed from the clinic at the providers discretion.      For prescription refill requests, have your pharmacy contact our office and allow 72 hours for refills to be completed.    Today you received the following chemotherapy and/or immunotherapy agents - herceptin, paclitaxel      To help prevent nausea and vomiting after your treatment, we encourage you to take your nausea medication as directed.  BELOW ARE SYMPTOMS THAT SHOULD BE REPORTED IMMEDIATELY: *FEVER GREATER THAN 100.4 F (38 C) OR HIGHER *CHILLS OR SWEATING *NAUSEA AND VOMITING THAT IS NOT CONTROLLED WITH YOUR NAUSEA MEDICATION *UNUSUAL SHORTNESS OF BREATH *UNUSUAL BRUISING OR BLEEDING *URINARY PROBLEMS (pain or burning when urinating, or frequent urination) *BOWEL PROBLEMS (unusual diarrhea, constipation, pain near the anus) TENDERNESS IN MOUTH AND THROAT WITH OR WITHOUT PRESENCE OF ULCERS (sore throat, sores in mouth, or a toothache) UNUSUAL RASH, SWELLING OR PAIN  UNUSUAL VAGINAL DISCHARGE OR ITCHING   Items with * indicate a potential emergency and should be followed up as soon as possible or go to the Emergency Department if any problems should occur.  Please show the CHEMOTHERAPY ALERT CARD or IMMUNOTHERAPY ALERT CARD at  check-in to the Emergency Department and triage nurse.  Should you have questions after your visit or need to cancel or reschedule your appointment, please contact [facility] [place] AT [place]  [phone] and follow the prompts.  Office hours are 8:00 a.m. to 4:30 p.m. Monday - Friday. Please note that voicemails left after 4:00 p.m. may not be returned until the following business day.  We are closed weekends and major holidays. You have access to a nurse at all times for urgent questions. Please call the main number to the clinic [phone] and follow the prompts.  For any non-urgent questions, you may also contact your provider using MyChart. We now offer e-Visits for anyone 58 and older to request care online for non-urgent symptoms. For details visit [email or web address].   Also download the MyChart app! Go to the app store, search "MyChart", open the app, select [facility], and log in with your MyChart username and password.  Due to Covid, a mask is required upon entering the hospital/clinic. If you do not have a mask, one will be given to you upon arrival. For doctor visits, patients may have 1 support person aged 53 or older with them. For treatment visits, patients cannot have anyone with them due to current Covid guidelines and our immunocompromised population.   Trastuzumab injection for infusion What is this medication? TRASTUZUMAB (tras TOO zoo mab) is a monoclonal antibody. It is used to treat breast cancer and stomach cancer. This medicine may be used for other purposes; ask your health care provider or pharmacist if you have questions. COMMON BRAND NAME(S): [name], [name], [name],  [name] What should I tell my care team before I take this medication? They need to know if you have any of these conditions: heart disease heart failure lung or breathing disease, like asthma an unusual or allergic reaction to trastuzumab, benzyl  alcohol, or other medications, foods, dyes, or preservatives pregnant or trying to get pregnant breast-feeding How should I use this medication? This drug is given as an infusion into a vein. It is administered in a hospital or clinic by a specially trained health care professional. Talk to your pediatrician regarding the use of this medicine in children. This medicine is not approved for use in children. Overdosage: If you think you have taken too much of this medicine contact a poison control center or emergency room at once. NOTE: This medicine is only for you. Do not share this medicine with others. What if I miss a dose? It is important not to miss a dose. Call your doctor or health care professional if you are unable to keep an appointment. What may interact with this medication? This medicine may interact with the following medications: certain types of chemotherapy, such as daunorubicin, doxorubicin, epirubicin, and idarubicin This list may not describe all possible interactions. Give your health care provider a list of all the medicines, herbs, non-prescription drugs, or dietary supplements you use. Also tell them if you smoke, drink alcohol, or use illegal drugs. Some items may interact with your medicine. What should I watch for while using this medication? Visit your doctor for checks on your progress. Report any side effects. Continue your course of treatment even though you feel ill unless your doctor tells you to stop. Call your doctor or health care professional for advice if you get a fever, chills or sore throat, or other symptoms of a cold or flu. Do not treat yourself. Try to avoid being around people who are sick. You may experience fever, chills and shaking during your first infusion. These effects are usually mild and can be treated with other medicines. Report any side effects during the infusion to your health care professional. Fever and chills usually do not happen with  later infusions. Do not become pregnant while taking this medicine or for 7 months after stopping it. Women should inform their doctor if they wish to become pregnant or think they might be pregnant. Women of child-bearing potential will need to have a negative pregnancy test before starting this medicine. There is a potential for serious side effects to an unborn child. Talk to your health care professional or pharmacist for more information. Do not breast-feed an infant while taking this medicine or for 7 months after stopping it. Women must use effective birth control with this medicine. What side effects may I notice from receiving this medication? Side effects that you should report to your doctor or health care professional as soon as possible: allergic reactions like skin rash, itching or hives, swelling of the face, lips, or tongue chest pain or palpitations cough dizziness feeling faint or lightheaded, falls fever general ill feeling or flu-like symptoms signs of worsening heart failure like breathing problems; swelling in your legs and feet unusually weak or tired Side effects that usually do not require medical attention (report to your doctor or health care professional if they continue or are bothersome): bone pain changes in taste diarrhea joint pain nausea/vomiting weight loss This list may not describe all possible side effects. Call your doctor for medical advice about side effects. You may report side  effects to FDA at 1-800-FDA-1088. Where should I keep my medication? This drug is given in a hospital or clinic and will not be stored at home. NOTE: This sheet is a summary. It may not cover all possible information. If you have questions about this medicine, talk to your doctor, pharmacist, or health care provider.  2022 Elsevier/Gold Standard (2016-01-22 00:00:00)  Paclitaxel injection What is this medication? PACLITAXEL (PAK li TAX el) is a chemotherapy drug. It  targets fast dividing cells, like cancer cells, and causes these cells to die. This medicine is used to treat ovarian cancer, breast cancer, lung cancer, Kaposi's sarcoma, and other cancers. This medicine may be used for other purposes; ask your health care provider or pharmacist if you have questions. COMMON BRAND NAME(S): Onxol, Taxol What should I tell my care team before I take this medication? They need to know if you have any of these conditions: history of irregular heartbeat liver disease low blood counts, like low white cell, platelet, or red cell counts lung or breathing disease, like asthma tingling of the fingers or toes, or other nerve disorder an unusual or allergic reaction to paclitaxel, alcohol, polyoxyethylated castor oil, other chemotherapy, other medicines, foods, dyes, or preservatives pregnant or trying to get pregnant breast-feeding How should I use this medication? This drug is given as an infusion into a vein. It is administered in a hospital or clinic by a specially trained health care professional. Talk to your pediatrician regarding the use of this medicine in children. Special care may be needed. Overdosage: If you think you have taken too much of this medicine contact a poison control center or emergency room at once. NOTE: This medicine is only for you. Do not share this medicine with others. What if I miss a dose? It is important not to miss your dose. Call your doctor or health care professional if you are unable to keep an appointment. What may interact with this medication? Do not take this medicine with any of the following medications: live virus vaccines This medicine may also interact with the following medications: antiviral medicines for hepatitis, HIV or AIDS certain antibiotics like erythromycin and clarithromycin certain medicines for fungal infections like ketoconazole and itraconazole certain medicines for seizures like carbamazepine,  phenobarbital, phenytoin gemfibrozil nefazodone rifampin St. John's wort This list may not describe all possible interactions. Give your health care provider a list of all the medicines, herbs, non-prescription drugs, or dietary supplements you use. Also tell them if you smoke, drink alcohol, or use illegal drugs. Some items may interact with your medicine. What should I watch for while using this medication? Your condition will be monitored carefully while you are receiving this medicine. You will need important blood work done while you are taking this medicine. This medicine can cause serious allergic reactions. To reduce your risk you will need to take other medicine(s) before treatment with this medicine. If you experience allergic reactions like skin rash, itching or hives, swelling of the face, lips, or tongue, tell your doctor or health care professional right away. In some cases, you may be given additional medicines to help with side effects. Follow all directions for their use. This drug may make you feel generally unwell. This is not uncommon, as chemotherapy can affect healthy cells as well as cancer cells. Report any side effects. Continue your course of treatment even though you feel ill unless your doctor tells you to stop. Call your doctor or health care professional for advice if  you get a fever, chills or sore throat, or other symptoms of a cold or flu. Do not treat yourself. This drug decreases your body's ability to fight infections. Try to avoid being around people who are sick. This medicine may increase your risk to bruise or bleed. Call your doctor or health care professional if you notice any unusual bleeding. Be careful brushing and flossing your teeth or using a toothpick because you may get an infection or bleed more easily. If you have any dental work done, tell your dentist you are receiving this medicine. Avoid taking products that contain aspirin, acetaminophen,  ibuprofen, naproxen, or ketoprofen unless instructed by your doctor. These medicines may hide a fever. Do not become pregnant while taking this medicine. Women should inform their doctor if they wish to become pregnant or think they might be pregnant. There is a potential for serious side effects to an unborn child. Talk to your health care professional or pharmacist for more information. Do not breast-feed an infant while taking this medicine. Men are advised not to father a child while receiving this medicine. This product may contain alcohol. Ask your pharmacist or healthcare provider if this medicine contains alcohol. Be sure to tell all healthcare providers you are taking this medicine. Certain medicines, like metronidazole and disulfiram, can cause an unpleasant reaction when taken with alcohol. The reaction includes flushing, headache, nausea, vomiting, sweating, and increased thirst. The reaction can last from 30 minutes to several hours. What side effects may I notice from receiving this medication? Side effects that you should report to your doctor or health care professional as soon as possible: allergic reactions like skin rash, itching or hives, swelling of the face, lips, or tongue breathing problems changes in vision fast, irregular heartbeat high or low blood pressure mouth sores pain, tingling, numbness in the hands or feet signs of decreased platelets or bleeding - bruising, pinpoint red spots on the skin, black, tarry stools, blood in the urine signs of decreased red blood cells - unusually weak or tired, feeling faint or lightheaded, falls signs of infection - fever or chills, cough, sore throat, pain or difficulty passing urine signs and symptoms of liver injury like dark yellow or brown urine; general ill feeling or flu-like symptoms; light-colored stools; loss of appetite; nausea; right upper belly pain; unusually weak or tired; yellowing of the eyes or skin swelling of the  ankles, feet, hands unusually slow heartbeat Side effects that usually do not require medical attention (report to your doctor or health care professional if they continue or are bothersome): diarrhea hair loss loss of appetite muscle or joint pain nausea, vomiting pain, redness, or irritation at site where injected tiredness This list may not describe all possible side effects. Call your doctor for medical advice about side effects. You may report side effects to FDA at 1-800-FDA-1088. Where should I keep my medication? This drug is given in a hospital or clinic and will not be stored at home. NOTE: This sheet is a summary. It may not cover all possible information. If you have questions about this medicine, talk to your doctor, pharmacist, or health care provider.  2022 Elsevier/Gold Standard (2020-09-25 00:00:00)

## 2021-02-08 NOTE — Assessment & Plan Note (Addendum)
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   # Proceed with weekly Taxol-[name] #3-day 8 [2 more- last/in 2 weeks]  today today. Labs today reviewed;  acceptable for treatment today.   # Mild Hypokalemia-myalgia-  3.4- rcommend Kdur 20 BID;   # AST elevation-49- G-1 from Taxol- monitor fo now.  Stable.  # Nausea- no vomiting-improved with antiemetics-STABLE   # Diarrhea- G-1-2; immodium- STABLE   # Headaches/Migraines-on Fioricet/amlotriptan; and also Emgalti SQ.[? Low K]-STABLE   # Fatigue/ Fibromyalgia- ok to proceed with  Injections/ortho.STABLE   # Folliculitis- Rash on scalp: Secondary to Taxol. S/p eval with Dermatology [Dr.Kim]  vaginal yeast infection/inframammary fungal dermatitis-recommend Diflucan as needed/clindamycin topical gel- derma; STABLE.   # DISPOSITION: # chemo today.  # 1 week- labs- cbc/cmp-Taxol-[name] #  2 weeks-  MD-; labs- cbc/cmp; taxol-[name] weekly-Dr.B

## 2021-02-08 NOTE — Progress Notes (Signed)
HR 104, ok to proceed with treatment per Dr. Rogue Bussing.

## 2021-02-15 ENCOUNTER — Inpatient Hospital Stay: Payer: PRIVATE HEALTH INSURANCE

## 2021-02-15 ENCOUNTER — Other Ambulatory Visit: Payer: Self-pay

## 2021-02-15 ENCOUNTER — Ambulatory Visit: Payer: PRIVATE HEALTH INSURANCE | Admitting: Internal Medicine

## 2021-02-15 VITALS — BP 106/80 | HR 99 | Temp 99.1°F | Resp 18 | Wt 183.2 lb

## 2021-02-15 DIAGNOSIS — C50211 Malignant neoplasm of upper-inner quadrant of right female breast: Secondary | ICD-10-CM

## 2021-02-15 DIAGNOSIS — Z170 Estrogen receptor positive status [ER+]: Secondary | ICD-10-CM

## 2021-02-15 DIAGNOSIS — Z5111 Encounter for antineoplastic chemotherapy: Secondary | ICD-10-CM | POA: Diagnosis not present

## 2021-02-15 LAB — COMPREHENSIVE METABOLIC PANEL
ALT: 44 U/L (ref 0–44)
AST: 32 U/L (ref 15–41)
Albumin: 3.9 g/dL (ref 3.5–5.0)
Alkaline Phosphatase: 49 U/L (ref 38–126)
Anion gap: 8 (ref 5–15)
BUN: 10 mg/dL (ref 6–20)
CO2: 22 mmol/L (ref 22–32)
Calcium: 9.1 mg/dL (ref 8.9–10.3)
Chloride: 107 mmol/L (ref 98–111)
Creatinine, Ser: 0.78 mg/dL (ref 0.44–1.00)
GFR, Estimated: >60 mL/min (ref >60)
Glucose, Bld: 121 mg/dL — ABNORMAL HIGH (ref 70–99)
Potassium: 3.8 mmol/L (ref 3.5–5.1)
Sodium: 137 mmol/L (ref 135–145)
Total Bilirubin: 0.3 mg/dL (ref 0.3–1.2)
Total Protein: 6.8 g/dL (ref 6.5–8.1)

## 2021-02-15 LAB — CBC WITH DIFFERENTIAL/PLATELET
Abs Immature Granulocytes: 0.07 10*3/uL (ref 0.00–0.07)
Basophils Absolute: 0.1 10*3/uL (ref 0.0–0.1)
Basophils Relative: 2 %
Eosinophils Absolute: 0.2 10*3/uL (ref 0.0–0.5)
Eosinophils Relative: 3 %
HCT: 38.4 % (ref 36.0–46.0)
Hemoglobin: 12.7 g/dL (ref 12.0–15.0)
Immature Granulocytes: 1 %
Lymphocytes Relative: 47 %
Lymphs Abs: 3.1 10*3/uL (ref 0.7–4.0)
MCH: 32.7 pg (ref 26.0–34.0)
MCHC: 33.1 g/dL (ref 30.0–36.0)
MCV: 99 fL (ref 80.0–100.0)
Monocytes Absolute: 0.4 10*3/uL (ref 0.1–1.0)
Monocytes Relative: 7 %
Neutro Abs: 2.6 10*3/uL (ref 1.7–7.7)
Neutrophils Relative %: 40 %
Platelets: 395 10*3/uL (ref 150–400)
RBC: 3.88 MIL/uL (ref 3.87–5.11)
RDW: 12.8 % (ref 11.5–15.5)
WBC: 6.5 10*3/uL (ref 4.0–10.5)
nRBC: 0 % (ref 0.0–0.2)

## 2021-02-15 LAB — PREGNANCY, URINE: Preg Test, Ur: NEGATIVE

## 2021-02-15 MED ORDER — SODIUM CHLORIDE 0.9 % IV SOLN
10.0000 mg | Freq: Once | INTRAVENOUS | Status: AC
  Administered 2021-02-15: 10 mg via INTRAVENOUS
  Filled 2021-02-15: qty 10

## 2021-02-15 MED ORDER — HEPARIN SOD (PORK) LOCK FLUSH 100 UNIT/ML IV SOLN
INTRAVENOUS | Status: AC
  Filled 2021-02-15: qty 5

## 2021-02-15 MED ORDER — SODIUM CHLORIDE 0.9 % IV SOLN
80.0000 mg/m2 | Freq: Once | INTRAVENOUS | Status: AC
  Administered 2021-02-15: 156 mg via INTRAVENOUS
  Filled 2021-02-15: qty 26

## 2021-02-15 MED ORDER — ACETAMINOPHEN 325 MG PO TABS
650.0000 mg | ORAL_TABLET | Freq: Once | ORAL | Status: AC
  Administered 2021-02-15: 650 mg via ORAL
  Filled 2021-02-15: qty 2

## 2021-02-15 MED ORDER — FAMOTIDINE IN NACL 20-0.9 MG/50ML-% IV SOLN
20.0000 mg | Freq: Once | INTRAVENOUS | Status: AC
  Administered 2021-02-15: 20 mg via INTRAVENOUS
  Filled 2021-02-15: qty 50

## 2021-02-15 MED ORDER — DIPHENHYDRAMINE HCL 50 MG/ML IJ SOLN
50.0000 mg | Freq: Once | INTRAMUSCULAR | Status: AC
  Administered 2021-02-15: 50 mg via INTRAVENOUS
  Filled 2021-02-15: qty 1

## 2021-02-15 MED ORDER — TRASTUZUMAB-DKST CHEMO 150 MG IV SOLR
2.0000 mg/kg | Freq: Once | INTRAVENOUS | Status: AC
  Administered 2021-02-15: 168 mg via INTRAVENOUS
  Filled 2021-02-15: qty 8

## 2021-02-15 MED ORDER — SODIUM CHLORIDE 0.9 % IV SOLN
Freq: Once | INTRAVENOUS | Status: AC
  Filled 2021-02-15: qty 250

## 2021-02-20 ENCOUNTER — Other Ambulatory Visit: Payer: Self-pay | Admitting: *Deleted

## 2021-02-20 MED ORDER — EMGALITY 120 MG/ML ~~LOC~~ SOAJ: 120.0000 mg | SUBCUTANEOUS | 4 refills | Status: DC

## 2021-02-22 ENCOUNTER — Inpatient Hospital Stay: Payer: PRIVATE HEALTH INSURANCE

## 2021-02-22 ENCOUNTER — Inpatient Hospital Stay (HOSPITAL_BASED_OUTPATIENT_CLINIC_OR_DEPARTMENT_OTHER): Payer: PRIVATE HEALTH INSURANCE | Admitting: Internal Medicine

## 2021-02-22 ENCOUNTER — Encounter: Payer: Self-pay | Admitting: Internal Medicine

## 2021-02-22 ENCOUNTER — Other Ambulatory Visit: Payer: Self-pay

## 2021-02-22 ENCOUNTER — Inpatient Hospital Stay: Payer: PRIVATE HEALTH INSURANCE | Attending: Internal Medicine

## 2021-02-22 VITALS — BP 110/91 | HR 92 | Temp 98.3°F | Ht 66.0 in | Wt 183.6 lb

## 2021-02-22 DIAGNOSIS — Z5111 Encounter for antineoplastic chemotherapy: Secondary | ICD-10-CM | POA: Insufficient documentation

## 2021-02-22 DIAGNOSIS — Z9221 Personal history of antineoplastic chemotherapy: Secondary | ICD-10-CM

## 2021-02-22 DIAGNOSIS — C50211 Malignant neoplasm of upper-inner quadrant of right female breast: Secondary | ICD-10-CM | POA: Diagnosis not present

## 2021-02-22 DIAGNOSIS — Z5112 Encounter for antineoplastic immunotherapy: Secondary | ICD-10-CM | POA: Diagnosis not present

## 2021-02-22 DIAGNOSIS — Z79899 Other long term (current) drug therapy: Secondary | ICD-10-CM | POA: Insufficient documentation

## 2021-02-22 DIAGNOSIS — Z170 Estrogen receptor positive status [ER+]: Secondary | ICD-10-CM | POA: Diagnosis not present

## 2021-02-22 LAB — COMPREHENSIVE METABOLIC PANEL
ALT: 63 U/L — ABNORMAL HIGH (ref 0–44)
AST: 37 U/L (ref 15–41)
Albumin: 4.2 g/dL (ref 3.5–5.0)
Alkaline Phosphatase: 47 U/L (ref 38–126)
Anion gap: 7 (ref 5–15)
BUN: 10 mg/dL (ref 6–20)
CO2: 22 mmol/L (ref 22–32)
Calcium: 9.1 mg/dL (ref 8.9–10.3)
Chloride: 108 mmol/L (ref 98–111)
Creatinine, Ser: 0.73 mg/dL (ref 0.44–1.00)
GFR, Estimated: >60 mL/min (ref >60)
Glucose, Bld: 120 mg/dL — ABNORMAL HIGH (ref 70–99)
Potassium: 3.6 mmol/L (ref 3.5–5.1)
Sodium: 137 mmol/L (ref 135–145)
Total Bilirubin: 0.2 mg/dL — ABNORMAL LOW (ref 0.3–1.2)
Total Protein: 7 g/dL (ref 6.5–8.1)

## 2021-02-22 LAB — CBC WITH DIFFERENTIAL/PLATELET
Abs Immature Granulocytes: 0.08 10*3/uL — ABNORMAL HIGH (ref 0.00–0.07)
Basophils Absolute: 0.1 10*3/uL (ref 0.0–0.1)
Basophils Relative: 2 %
Eosinophils Absolute: 0.3 10*3/uL (ref 0.0–0.5)
Eosinophils Relative: 4 %
HCT: 39.3 % (ref 36.0–46.0)
Hemoglobin: 13 g/dL (ref 12.0–15.0)
Immature Granulocytes: 1 %
Lymphocytes Relative: 46 %
Lymphs Abs: 3.2 10*3/uL (ref 0.7–4.0)
MCH: 31.4 pg (ref 26.0–34.0)
MCHC: 33.1 g/dL (ref 30.0–36.0)
MCV: 94.9 fL (ref 80.0–100.0)
Monocytes Absolute: 0.4 10*3/uL (ref 0.1–1.0)
Monocytes Relative: 6 %
Neutro Abs: 2.8 10*3/uL (ref 1.7–7.7)
Neutrophils Relative %: 41 %
Platelets: 396 10*3/uL (ref 150–400)
RBC: 4.14 MIL/uL (ref 3.87–5.11)
RDW: 12.8 % (ref 11.5–15.5)
WBC: 6.8 10*3/uL (ref 4.0–10.5)
nRBC: 0 % (ref 0.0–0.2)

## 2021-02-22 LAB — PREGNANCY, URINE: Preg Test, Ur: NEGATIVE

## 2021-02-22 MED ORDER — SODIUM CHLORIDE 0.9 % IV SOLN
10.0000 mg | Freq: Once | INTRAVENOUS | Status: AC
  Administered 2021-02-22: 10 mg via INTRAVENOUS
  Filled 2021-02-22: qty 10

## 2021-02-22 MED ORDER — TRASTUZUMAB-DKST CHEMO 150 MG IV SOLR
2.0000 mg/kg | Freq: Once | INTRAVENOUS | Status: AC
  Administered 2021-02-22: 168 mg via INTRAVENOUS
  Filled 2021-02-22: qty 8

## 2021-02-22 MED ORDER — ACETAMINOPHEN 325 MG PO TABS
650.0000 mg | ORAL_TABLET | Freq: Once | ORAL | Status: AC
  Administered 2021-02-22: 650 mg via ORAL
  Filled 2021-02-22: qty 2

## 2021-02-22 MED ORDER — SODIUM CHLORIDE 0.9 % IV SOLN
Freq: Once | INTRAVENOUS | Status: AC
  Filled 2021-02-22: qty 250

## 2021-02-22 MED ORDER — SODIUM CHLORIDE 0.9 % IV SOLN
80.0000 mg/m2 | Freq: Once | INTRAVENOUS | Status: AC
  Administered 2021-02-22: 156 mg via INTRAVENOUS
  Filled 2021-02-22: qty 26

## 2021-02-22 MED ORDER — FAMOTIDINE IN NACL 20-0.9 MG/50ML-% IV SOLN
20.0000 mg | Freq: Once | INTRAVENOUS | Status: AC
  Administered 2021-02-22: 20 mg via INTRAVENOUS
  Filled 2021-02-22: qty 50

## 2021-02-22 MED ORDER — HEPARIN SOD (PORK) LOCK FLUSH 100 UNIT/ML IV SOLN
INTRAVENOUS | Status: AC
  Filled 2021-02-22: qty 5

## 2021-02-22 MED ORDER — DIPHENHYDRAMINE HCL 50 MG/ML IJ SOLN
50.0000 mg | Freq: Once | INTRAMUSCULAR | Status: AC
  Administered 2021-02-22: 50 mg via INTRAVENOUS
  Filled 2021-02-22: qty 1

## 2021-02-22 NOTE — Patient Instructions (Signed)
[facility] AT [place]  Discharge Instructions: Thank you for choosing [facility] to provide your oncology and hematology care.  If you have a lab appointment with the [facility], please go directly to the [facility] and check in at the registration area.  Wear comfortable clothing and clothing appropriate for easy access to any Portacath or PICC line.   We strive to give you quality time with your provider. You may need to reschedule your appointment if you arrive late (15 or more minutes).  Arriving late affects you and other patients whose appointments are after yours.  Also, if you miss three or more appointments without notifying the office, you may be dismissed from the clinic at the providers discretion.      For prescription refill requests, have your pharmacy contact our office and allow 72 hours for refills to be completed.    Today you received the following chemotherapy and/or immunotherapy agents : [name] / Taxol   To help prevent nausea and vomiting after your treatment, we encourage you to take your nausea medication as directed.  BELOW ARE SYMPTOMS THAT SHOULD BE REPORTED IMMEDIATELY: *FEVER GREATER THAN 100.4 F (38 C) OR HIGHER *CHILLS OR SWEATING *NAUSEA AND VOMITING THAT IS NOT CONTROLLED WITH YOUR NAUSEA MEDICATION *UNUSUAL SHORTNESS OF BREATH *UNUSUAL BRUISING OR BLEEDING *URINARY PROBLEMS (pain or burning when urinating, or frequent urination) *BOWEL PROBLEMS (unusual diarrhea, constipation, pain near the anus) TENDERNESS IN MOUTH AND THROAT WITH OR WITHOUT PRESENCE OF ULCERS (sore throat, sores in mouth, or a toothache) UNUSUAL RASH, SWELLING OR PAIN  UNUSUAL VAGINAL DISCHARGE OR ITCHING   Items with * indicate a potential emergency and should be followed up as soon as possible or go to the Emergency Department if any problems should occur.  Please show the CHEMOTHERAPY ALERT CARD or IMMUNOTHERAPY ALERT CARD at  check-in to the Emergency Department and triage nurse.  Should you have questions after your visit or need to cancel or reschedule your appointment, please contact [facility] [place] AT [place]  [phone] and follow the prompts.  Office hours are 8:00 a.m. to 4:30 p.m. Monday - Friday. Please note that voicemails left after 4:00 p.m. may not be returned until the following business day.  We are closed weekends and major holidays. You have access to a nurse at all times for urgent questions. Please call the main number to the clinic [phone] and follow the prompts.  For any non-urgent questions, you may also contact your provider using MyChart. We now offer e-Visits for anyone 69 and older to request care online for non-urgent symptoms. For details visit [email or web address].   Also download the MyChart app! Go to the app store, search "MyChart", open the app, select [facility], and log in with your MyChart username and password.  Due to Covid, a mask is required upon entering the hospital/clinic. If you do not have a mask, one will be given to you upon arrival. For doctor visits, patients may have 1 support person aged 4 or older with them. For treatment visits, patients cannot have anyone with them due to current Covid guidelines and our immunocompromised population.

## 2021-02-22 NOTE — Patient Instructions (Signed)
#  Take Claritin DM-over-the-counter for 1 week.

## 2021-02-22 NOTE — Progress Notes (Signed)
Pt states she feels like she has had a cold this week. Nasal congestion, cough, sore throat x2-3 days, no fever, body aches.

## 2021-02-22 NOTE — Progress Notes (Signed)
[place] NOTE  Patient Care Team: [name], MD as PCP - General (Family Medicine)  CHIEF COMPLAINTS/PURPOSE OF CONSULTATION: Breast cancer  #  Oncology History Overview Note   #Right breast-invasive mammary carcinoma 2 o'clock position #1.1 cm-ER/PR HER2/neu positive [3+]; SEP, 2022-MRI-up to 3 cm non-mass enhancement noted.  #Left breast- MRI #upper outer quadrant- 13m #retroareolar 1cm-. NEGATIVE for malignancy   EProcedure: Lumpectomy  Specimen Laterality: Right  Histologic Type: Invasive ductal carcinoma  Histologic Grade:       Glandular (Acinar)/Tubular Differentiation: 3       Nuclear Pleomorphism: 2       Mitotic Rate: 1       Overall Grade: 2  Tumor Size: 0.9 cm  Ductal Carcinoma In Situ: Present, intermediate grade  Treatment Effect in the Breast: No known presurgical therapy  Margins: All margins negative for invasive carcinoma       Distance from Closest Margin (mm): Cannot be determined  DCIS Margins: Uninvolved by DCIS       Distance from Closest Margin (mm): Cannot be determined  Regional Lymph Nodes:       Number of Lymph Nodes Examined: 5       Number of Sentinel Nodes Examined: 5       Number of Lymph Nodes with Macrometastases (>2 mm): 0       Number of Lymph Nodes with Micrometastases: 0       Number of Lymph Nodes with Isolated Tumor Cells (=0.2 mm or =200  cells): 0       Size of Largest Metastatic Deposit (mm): Not applicable       Extranodal Extension: Not applicable  Distant Metastasis:       Distant Site(s) Involved: Not applicable  Breast Biomarker Testing Performed on Previous Biopsy:       Testing Performed on Case Number: [id number]             Estrogen Receptor: 95%, positive, strong staining intensity             Progesterone Receptor: 95%, positive, strong staining  intensity             HER2: Positive (3+)             Ki-67: 15%  Pathologic Stage Classification (pTNM, AJCC 8th Edition): pT1b, pN0   Representative Tumor Block: A4  Comment(s): None   # NOV 4th, 2022- TAXOL-[name] q W  ---------------------------------   # Headaches-Migraine/ Anxiety [[name]; Dr.Yan  botox]  #Genetic testing-s/p genetic counseling-expanded panel negative.  #Reproductive counseling: DECLINED reproductive endocrinology.     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  The report dates are October 01, 2020 and October 11, 2020, respectively.    The BRCAplus panel offered by [name] includes sequencing and deletion/duplication analysis for the following 8 genes: ATM, BRCA1, BRCA2, CDH1, CHEK2, PALB2, PTEN, and TP53.  The CustomNext-Cancer+RNAinsight panel offered by [employer] sequencing and rearrangement analysis for the following 47 genes:  APC, ATM, AXIN2, BARD1, BMPR1A, BRCA1, BRCA2, BRIP1, CDH1, CDK4, CDKN2A, CHEK2, DICER1, EPCAM, GREM1, HOXB13, MEN1, MLH1, MSH2, MSH3, MSH6, MUTYH, NBN, NF1, NF2, NTHL1, PALB2, PMS2, POLD1, POLE, PTEN, RAD51C, RAD51D, RECQL, RET, SDHA, SDHAF2,  SDHB, SDHC, SDHD, SMAD4, SMARCA4, STK11, TP53, TSC1, TSC2, and VHL.  RNA data is routinely analyzed for use in variant interpretation for all genes.   11/23/2020 -  Chemotherapy   Patient is on Treatment Plan : BREAST Paclitaxel + Trastuzumab q7d / Trastuzumab q21d        HISTORY OF PRESENTING ILLNESS: Ambulating independently.  Alone.  Alexandra Warren 41 y.o.  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  Patient had episode of cold-she has been taking DayQuil containing phenylephrine.   Runny nose/sneezing.  No fever no chills.  Also s/p evaluation by dermatology-using clindamycin gel for skin rash; Patient complains of continued fatigue.  Myalgias- on Kur improved..  Denies any significant tingling or numbness.  Complains of ongoing diarrhea not any worse.  Migraines are stable.  Slightly worse awaiting Botox.    Review of Systems  Constitutional:  Negative for chills, diaphoresis, fever, malaise/fatigue and weight loss.  HENT:  Negative for nosebleeds and sore throat.   Eyes:  Negative for double vision.  Respiratory:  Negative for cough, hemoptysis, sputum production, shortness of breath and wheezing.   Cardiovascular:  Negative for chest pain, palpitations, orthopnea and leg swelling.  Gastrointestinal:  Negative for abdominal pain, blood in stool, constipation, diarrhea, heartburn, melena, nausea and vomiting.  Genitourinary:  Negative for dysuria, frequency and urgency.  Musculoskeletal:  Positive for myalgias. Negative for back pain and joint pain.  Skin:  Positive for itching and rash.  Neurological:  Positive for headaches. Negative for dizziness, tingling, focal weakness and weakness.  Endo/Heme/Allergies:  Does not bruise/bleed easily.  Psychiatric/Behavioral:  Negative for depression. The patient is nervous/anxious and has insomnia.   All other systems reviewed and are negative.   MEDICAL HISTORY:  Past Medical History:  Diagnosis Date   Acid reflux    Anxiety and depression    Dr. Gala Murdoch, MD   Back pain    Family history of breast cancer 09/21/2020   Family history of malignant neoplasm of digestive organ 09/21/2020   Fibromyalgia    History of gallstones    Malignant neoplasm of upper-inner quadrant of right female breast ([place]) 09/21/2020   Migraine     SURGICAL HISTORY: Past Surgical History:  Procedure Laterality Date   BREAST LUMPECTOMY WITH RADIOACTIVE SEED LOCALIZATION Right 10/17/2020   Procedure: RIGHT BREAST LUMPECTOMY WITH RADIOACTIVE  SEED LOCALIZATION WITH RIGHT SENTINEL LYMPH NODE BIOPSY;  Surgeon: Stark Klein, MD;  Location: [facility];  Service: General;  Laterality: Right;   CHOLECYSTECTOMY  12/2004   PORTACATH PLACEMENT Left 10/17/2020   Procedure: INSERTION PORT-A-CATH;  Surgeon: Stark Klein, MD;  Location: [facility];  Service: General;  Laterality: Left;    SOCIAL HISTORY: Social History   Socioeconomic History   Marital status: Divorced    Spouse name: Corene Cornea   Number of children: 0   Years of education: MS   Highest education level: Not on file  Occupational History   Occupation: Optometrist  Tobacco Use   Smoking status: Never   Smokeless tobacco: Never  Vaping Use   Vaping Use: Never used  Substance and Sexual Activity   Alcohol use: Not Currently    Comment: pt states 1 glass of wine maybe once a month   Drug use: No   Sexual activity: Yes    Birth control/protection: Condom  Other Topics Concern   Not on file  Social History Narrative   Accountant for commercial  firm; wants short term disability;  never smoked; 2 drinks a month. Lives in [place] with boy friend [[place] No children. Has one brother    Social Determinants of Radio broadcast assistant Strain: Not on file  Food Insecurity: Not on file  Transportation Needs: Not on file  Physical Activity: Not on file  Stress: Not on file  Social Connections: Not on file  Intimate Partner Violence: Not on file    FAMILY HISTORY: Family History  Problem Relation Age of Onset   Healthy Mother    Hypertension Father    Skin cancer Brother        dx before 28   Colon cancer Maternal Aunt 50   Cervical cancer Maternal Aunt        dx before 65; x2   Cancer Paternal Grandmother 99       unknown primary; ? liver   Lung cancer Paternal Grandfather        dx after 30   Breast cancer Cousin 76       maternal cousin   Colon cancer Other        MGM's sister   Gastric cancer Other        PGM's sister   Leukemia Other         PMG's sister   Rectal cancer Neg Hx    Esophageal cancer Neg Hx     ALLERGIES:  is allergic to antihistamines, diphenhydramine-type; diphenhydramine hcl; promethazine; and pseudoeph-doxylamine-dm-apap.  MEDICATIONS:  Current Outpatient Medications  Medication Sig Dispense Refill   almotriptan (AXERT) 12.5 MG tablet Take 1 tab at onset of migraine.  May repeat in 2 hrs, if needed.  Max dose: 2 tabs/day. This is a 30 day prescription. 12 tablet 11   azelastine (ASTELIN) 0.1 % nasal spray Place 1 spray into both nostrils daily as needed for rhinitis.     BOTOX 200 units SOLR Inject 200 Units into the skin See admin instructions. Every 90 days     butalbital-acetaminophen-caffeine (FIORICET) 50-325-40 MG tablet Take 1 tablet by mouth every 8 (eight) hours as needed for headache. 10 tablet 5   clindamycin (CLEOCIN T) 1 % external solution Apply topically 2 (two) times daily as needed.     clonazePAM (KLONOPIN) 1 MG tablet Take 0.5-1 mg by mouth 2 (two) times daily as needed for anxiety.     diazepam (VALIUM) 5 MG tablet One pill 45-60 mins prior to procedure; 1 pill 15 mins prior if needed. 6 tablet 0   EMGALITY 120 MG/ML SOAJ Inject 120 mg into the skin every 30 (thirty) days. 3 mL 4   fluconazole (DIFLUCAN) 100 MG tablet TAKE 1 TABLET(100 MG) BY MOUTH DAILY AS NEEDED FOR YEAST INFECTION 6 tablet 0   lidocaine-prilocaine (EMLA) cream Apply 1 application topically as needed. 30 g 4   omeprazole (PRILOSEC) 20 MG capsule Take 20 mg by mouth daily.     ondansetron (ZOFRAN) 8 MG tablet Take 1 tablet (8 mg total) by mouth every 8 (eight) hours as needed for nausea or vomiting. 30 tablet 3   potassium chloride SA (KLOR-CON M) 20 MEQ tablet 1 pill twice a day 30 tablet 3   predniSONE (DELTASONE) 10 MG tablet Take 1 tablet (10 mg total) by mouth daily with breakfast. 30 tablet 0   prochlorperazine (COMPAZINE) 10 MG tablet Take 1 tablet (10 mg total) by mouth every 6 (six) hours as needed for nausea or  vomiting. 40 tablet 1  Rimegepant Sulfate (NURTEC) 75 MG TBDP Take 75 mg by mouth as needed (Take 1 at onset of headache, max is 75 mg in 24 hours). 8 tablet 11   rosuvastatin (CRESTOR) 20 MG tablet Take 20 mg by mouth daily.     tiZANidine (ZANAFLEX) 4 MG tablet Take 1 tablet (4 mg total) by mouth every 8 (eight) hours as needed for muscle spasms (migraines). This is a 30 day rx. 20 tablet 5   topiramate (TOPAMAX) 100 MG tablet Take 1 tablet (100 mg total) by mouth 2 (two) times daily. (Patient taking differently: Take 200 mg by mouth at bedtime.) 60 tablet 11   traMADol (ULTRAM) 50 MG tablet Take 50 mg by mouth every 6 (six) hours as needed for pain.     vortioxetine HBr (TRINTELLIX) 20 MG TABS tablet Take 20 mg by mouth daily.  0   zolpidem (AMBIEN) 10 MG tablet Take 10 mg by mouth at bedtime as needed for sleep.     No current facility-administered medications for this visit.   PHYSICAL EXAMINATION: ECOG PERFORMANCE STATUS: 0 - Asymptomatic  Vitals:   02/22/21 0854 02/22/21 0936  BP: (!) 124/109 (!) 110/91  Pulse: (!) 101 92  Temp: 98.3 F (36.8 C)   SpO2: 99%      Filed Weights   02/22/21 0854  Weight: 183 lb 9.6 oz (83.3 kg)    Papular macular rash noted on the scalp.  Physical Exam Vitals and nursing note reviewed.  HENT:     Head: Normocephalic and atraumatic.     Mouth/Throat:     Pharynx: Oropharynx is clear.  Eyes:     Extraocular Movements: Extraocular movements intact.     Pupils: Pupils are equal, round, and reactive to light.  Cardiovascular:     Rate and Rhythm: Normal rate and regular rhythm.  Abdominal:     Palpations: Abdomen is soft.  Musculoskeletal:        General: Normal range of motion.     Cervical back: Normal range of motion.  Skin:    General: Skin is warm.  Neurological:     General: No focal deficit present.     Mental Status: She is alert and oriented to person, place, and time.  Psychiatric:        Behavior: Behavior normal.         Judgment: Judgment normal.     LABORATORY DATA:  I have reviewed the data as listed Lab Results  Component Value Date   WBC 6.8 02/22/2021   HGB 13.0 02/22/2021   HCT 39.3 02/22/2021   MCV 94.9 02/22/2021   PLT 396 02/22/2021   Recent Labs    02/08/21 0827 02/15/21 0844 02/22/21 0844  NA 139 137 137  K 3.4* 3.8 3.6  CL 110 107 108  CO2 21* 22 22  GLUCOSE 129* 121* 120*  BUN $Re'10 10 10  'YWu$ CREATININE 0.72 0.78 0.73  CALCIUM 8.9 9.1 9.1  GFRNONAA >60 >60 >60  PROT 6.5 6.8 7.0  ALBUMIN 4.1 3.9 4.2  AST 32 32 37  ALT 46* 44 63*  ALKPHOS 45 49 47  BILITOT 0.4 0.3 0.2*    RADIOGRAPHIC STUDIES: I have personally reviewed the radiological images as listed and agreed with the findings in the report. No results found.  ASSESSMENT & PLAN:   Carcinoma of upper-inner quadrant of right breast in female, estrogen receptor positive ([place]) # STAGE-I- Right breast-invasive mammary carcinoma- pT1bN0 [11mm]- ER/PR-Positive; Her-2 positive.Proceed with Taxol [name]-weekly x12  followed by [name] every 3 weeks for 12 months.OCT 2022-  MUGA scan- 58%.s/p eval with Dr.Crystal.   # Proceed with weekly Taxol-[name] #3; day-22 [ last/ today]  . Labs today reviewed;  acceptable for treatment today. Plan herceptin q 3 Weeks; starting next cycle. Check Echo [no muga]  # URI/ ? Viral vs allergies- on day quil- see below re: elevated BP- see below; recommend Claritin.  # Elevated BP- [? Phenylephirine]-repeat diastolic 71K; recommend discontinue DayQuil.  Recommend Claritin.  # Mild Hypokalemia-myalgia-  3.4-on  Kdur 20 BID; improved- K-3.6.   # AST elevation-49- G-1 from Taxol- monitor fo now- Improved-   # Nausea- no vomiting-improved with antiemetics-STABLE  # Diarrhea- G-1-2; immodium- STABLE   # Headaches/Migraines-on Fioricet/amlotriptan; and also Emgalti SQ.[? Low K]-STABLE; awaiting next week.   # Fatigue/ Fibromyalgia- ok to proceed with  Injections/ortho.STABLE.   #  Folliculitis- Rash on scalp: Secondary to Taxol. S/p eval with Dermatology [Dr.Kim]  vaginal yeast infection/inframammary fungal dermatitis-recommend Diflucan as needed/clindamycin topical gel- derma; STABLE.   # DISPOSITION:re-check BP # chemo today.  #  3 weeks-  MD-; labs- cbc/cmp; [name]- 2 d echo- prior--Dr.B   All questions were answered. The patient/family knows to call the clinic with any problems, questions or concerns.     Cammie Sickle, MD 02/22/2021 2:01 PM

## 2021-02-22 NOTE — Assessment & Plan Note (Addendum)
#  STAGE-I- Right breast-invasive mammary carcinoma- pT1bN0 [92mm]- ER/PR-Positive; Her-2 positive.Proceed with Taxol [name]-weekly x12 followed by [name] every 3 weeks for 12 months.OCT 2022-  MUGA scan- 58%.s/p eval with Dr.Crystal.   # Proceed with weekly Taxol-[name] #3; day-22 [ last/ today]  . Labs today reviewed;  acceptable for treatment today. Plan herceptin q 3 Weeks; starting next cycle. Check Echo [no muga]  # URI/ ? Viral vs allergies- on day quil- see below re: elevated BP- see below; recommend Claritin.  # Elevated BP- [? Phenylephirine]-repeat diastolic 09O; recommend discontinue DayQuil.  Recommend Claritin.  # Mild Hypokalemia-myalgia-  3.4-on  Kdur 20 BID; improved- K-3.6.   # AST elevation-49- G-1 from Taxol- monitor fo now- Improved-   # Nausea- no vomiting-improved with antiemetics-STABLE  # Diarrhea- G-1-2; immodium- STABLE   # Headaches/Migraines-on Fioricet/amlotriptan; and also Emgalti SQ.[? Low K]-STABLE; awaiting next week.   # Fatigue/ Fibromyalgia- ok to proceed with  Injections/ortho.STABLE.   # Folliculitis- Rash on scalp: Secondary to Taxol. S/p eval with Dermatology [Dr.Kim]  vaginal yeast infection/inframammary fungal dermatitis-recommend Diflucan as needed/clindamycin topical gel- derma; STABLE.   # DISPOSITION:re-check BP # chemo today.  #  3 weeks-  MD-; labs- cbc/cmp; [name]- 2 d echo- prior--Dr.B

## 2021-02-26 ENCOUNTER — Ambulatory Visit (INDEPENDENT_AMBULATORY_CARE_PROVIDER_SITE_OTHER): Payer: PRIVATE HEALTH INSURANCE | Admitting: Neurology

## 2021-02-26 VITALS — BP 129/90 | HR 88

## 2021-02-26 DIAGNOSIS — G43009 Migraine without aura, not intractable, without status migrainosus: Secondary | ICD-10-CM | POA: Diagnosis not present

## 2021-02-26 NOTE — Progress Notes (Signed)
BOTOX 200 Units  Ndc -0023-39-21-02 Lot -[id number] Exp-09/2023 B/B

## 2021-02-26 NOTE — Procedures (Signed)
See Dr. Rhea Belton note

## 2021-02-27 DIAGNOSIS — G43009 Migraine without aura, not intractable, without status migrainosus: Secondary | ICD-10-CM | POA: Diagnosis not present

## 2021-02-27 MED ORDER — ONABOTULINUMTOXINA 100 UNITS IJ SOLR
200.0000 [IU] | Freq: Once | INTRAMUSCULAR | Status: AC
  Administered 2021-02-27: 200 [IU] via INTRAMUSCULAR

## 2021-02-27 NOTE — Progress Notes (Signed)
° °  BOTOX PROCEDURE NOTE FOR MIGRAINE HEADACHE   HISTORY: Kateland Leisinger is a 41 year old female with history of chronic migraine headache, presents today for her third Botox injection.  Her migraine is controlled with Botox injection and Emgality, Topamax 200 mg at nighttime, as preventive medications, using Axerta as abortive treatment, occasionally uses Fioricet as needed, about once a week, headache would improve within 1 hour,  She was diagnosed with right breast cancer, September 2022, MRI up to 3 cm non-mass enhancement noted, had lumpectomy, finished chemotherapy, pending radiation therapy   Description of procedure:  Botox injection for chronic migraine prevention, injection was performed according to [facility] protocol,  5 units of Botox was injected into each side, we did not perform bilateral frontalis injection, patient still have residual benefit, and left forehead region   Bilateral temporalis 8 injection sites Bilateral occipitalis 6 injection sites Bilateral cervical paraspinals 4 injection sites Bilateral upper trapezius 6 injection sites  Extra 60 unites were injected into bilateral temporoparietal region, and bilateral levator scapula, upper cervical region  Refilled Fioricet, Emgality, axert

## 2021-02-28 ENCOUNTER — Encounter: Payer: Self-pay | Admitting: *Deleted

## 2021-03-07 ENCOUNTER — Ambulatory Visit: Payer: No Typology Code available for payment source | Admitting: Neurology

## 2021-03-07 ENCOUNTER — Encounter: Payer: Self-pay | Admitting: Internal Medicine

## 2021-03-08 ENCOUNTER — Telehealth: Payer: Self-pay

## 2021-03-08 NOTE — Telephone Encounter (Signed)
Mychart message from pt: "Would it be possible for me to go back on Meloxicam now that the Taxol is over?   Thank you,"  Dr. B said it was ok for her to restart her meloxicam.   I noticed she hasn't seen the response on mychart, so I left her a message on her voicemail.

## 2021-03-11 ENCOUNTER — Ambulatory Visit: Payer: PRIVATE HEALTH INSURANCE

## 2021-03-14 ENCOUNTER — Ambulatory Visit
Admission: RE | Admit: 2021-03-14 | Discharge: 2021-03-14 | Disposition: A | Discharge status: Home / Self Care | Payer: PRIVATE HEALTH INSURANCE | Source: Ambulatory Visit | Attending: Radiation Oncology | Admitting: Radiation Oncology

## 2021-03-14 ENCOUNTER — Other Ambulatory Visit: Payer: Self-pay | Admitting: *Deleted

## 2021-03-14 DIAGNOSIS — C50211 Malignant neoplasm of upper-inner quadrant of right female breast: Secondary | ICD-10-CM | POA: Insufficient documentation

## 2021-03-14 MED ORDER — FLUCONAZOLE 100 MG PO TABS: 100.0000 mg | ORAL_TABLET | Freq: Every day | ORAL | 0 refills | Status: DC

## 2021-03-15 ENCOUNTER — Inpatient Hospital Stay: Payer: PRIVATE HEALTH INSURANCE

## 2021-03-15 ENCOUNTER — Inpatient Hospital Stay (HOSPITAL_BASED_OUTPATIENT_CLINIC_OR_DEPARTMENT_OTHER): Payer: PRIVATE HEALTH INSURANCE | Admitting: Internal Medicine

## 2021-03-15 ENCOUNTER — Encounter: Payer: Self-pay | Admitting: Internal Medicine

## 2021-03-15 ENCOUNTER — Other Ambulatory Visit: Payer: Self-pay

## 2021-03-15 VITALS — BP 119/90 | HR 77 | Resp 18

## 2021-03-15 DIAGNOSIS — Z170 Estrogen receptor positive status [ER+]: Secondary | ICD-10-CM

## 2021-03-15 DIAGNOSIS — Z5112 Encounter for antineoplastic immunotherapy: Secondary | ICD-10-CM | POA: Diagnosis not present

## 2021-03-15 DIAGNOSIS — C50211 Malignant neoplasm of upper-inner quadrant of right female breast: Secondary | ICD-10-CM

## 2021-03-15 LAB — CBC WITH DIFFERENTIAL/PLATELET
Abs Immature Granulocytes: 0.07 10*3/uL (ref 0.00–0.07)
Basophils Absolute: 0.1 10*3/uL (ref 0.0–0.1)
Basophils Relative: 2 %
Eosinophils Absolute: 0.3 10*3/uL (ref 0.0–0.5)
Eosinophils Relative: 3 %
HCT: 39.3 % (ref 36.0–46.0)
Hemoglobin: 13.2 g/dL (ref 12.0–15.0)
Immature Granulocytes: 1 %
Lymphocytes Relative: 42 %
Lymphs Abs: 3.4 10*3/uL (ref 0.7–4.0)
MCH: 32.2 pg (ref 26.0–34.0)
MCHC: 33.6 g/dL (ref 30.0–36.0)
MCV: 95.9 fL (ref 80.0–100.0)
Monocytes Absolute: 0.6 10*3/uL (ref 0.1–1.0)
Monocytes Relative: 7 %
Neutro Abs: 3.7 10*3/uL (ref 1.7–7.7)
Neutrophils Relative %: 45 %
Platelets: 366 10*3/uL (ref 150–400)
RBC: 4.1 MIL/uL (ref 3.87–5.11)
RDW: 12.5 % (ref 11.5–15.5)
WBC: 8.1 10*3/uL (ref 4.0–10.5)
nRBC: 0 % (ref 0.0–0.2)

## 2021-03-15 LAB — COMPREHENSIVE METABOLIC PANEL
ALT: 67 U/L — ABNORMAL HIGH (ref 0–44)
AST: 39 U/L (ref 15–41)
Albumin: 3.9 g/dL (ref 3.5–5.0)
Alkaline Phosphatase: 52 U/L (ref 38–126)
Anion gap: 7 (ref 5–15)
BUN: 16 mg/dL (ref 6–20)
CO2: 22 mmol/L (ref 22–32)
Calcium: 9 mg/dL (ref 8.9–10.3)
Chloride: 107 mmol/L (ref 98–111)
Creatinine, Ser: 0.9 mg/dL (ref 0.44–1.00)
GFR, Estimated: >60 mL/min (ref >60)
Glucose, Bld: 110 mg/dL — ABNORMAL HIGH (ref 70–99)
Potassium: 3.8 mmol/L (ref 3.5–5.1)
Sodium: 136 mmol/L (ref 135–145)
Total Bilirubin: 0.2 mg/dL — ABNORMAL LOW (ref 0.3–1.2)
Total Protein: 6.8 g/dL (ref 6.5–8.1)

## 2021-03-15 LAB — PREGNANCY, URINE: Preg Test, Ur: NEGATIVE

## 2021-03-15 MED ORDER — HEPARIN SOD (PORK) LOCK FLUSH 100 UNIT/ML IV SOLN
500.0000 [IU] | Freq: Once | INTRAVENOUS | Status: AC | PRN
  Filled 2021-03-15: qty 5

## 2021-03-15 MED ORDER — DIPHENHYDRAMINE HCL 25 MG PO CAPS: 50.0000 mg | ORAL_CAPSULE | Freq: Once | ORAL | Status: DC

## 2021-03-15 MED ORDER — ACETAMINOPHEN 325 MG PO TABS
650.0000 mg | ORAL_TABLET | Freq: Once | ORAL | Status: AC
  Administered 2021-03-15: 650 mg via ORAL
  Filled 2021-03-15: qty 2

## 2021-03-15 MED ORDER — SODIUM CHLORIDE 0.9% FLUSH
10.0000 mL | INTRAVENOUS | Status: DC | PRN
  Administered 2021-03-15: 10 mL
  Filled 2021-03-15: qty 10

## 2021-03-15 MED ORDER — TRASTUZUMAB-DKST CHEMO 150 MG IV SOLR
6.0000 mg/kg | Freq: Once | INTRAVENOUS | Status: AC
  Administered 2021-03-15: 483 mg via INTRAVENOUS
  Filled 2021-03-15: qty 23

## 2021-03-15 MED ORDER — SODIUM CHLORIDE 0.9 % IV SOLN
Freq: Once | INTRAVENOUS | Status: AC
  Filled 2021-03-15: qty 250

## 2021-03-15 MED ORDER — HEPARIN SOD (PORK) LOCK FLUSH 100 UNIT/ML IV SOLN
INTRAVENOUS | Status: AC
  Administered 2021-03-15: 500 [IU]
  Filled 2021-03-15: qty 5

## 2021-03-15 NOTE — Patient Instructions (Signed)
[facility] [place]   Discharge Instructions: Thank you for choosing [facility] to provide your oncology and hematology care.  If you have a lab appointment with the [facility], please go directly to the [facility] and check in at the registration area.   Wear comfortable clothing and clothing appropriate for easy access to any Portacath or PICC line.   We strive to give you quality time with your provider. You may need to reschedule your appointment if you arrive late (15 or more minutes).  Arriving late affects you and other patients whose appointments are after yours.  Also, if you miss three or more appointments without notifying the office, you may be dismissed from the clinic at the providers discretion.      For prescription refill requests, have your pharmacy contact our office and allow 72 hours for refills to be completed.    Today you received the following chemotherapy and/or immunotherapy agents: [name].      To help prevent nausea and vomiting after your treatment, we encourage you to take your nausea medication as directed.  BELOW ARE SYMPTOMS THAT SHOULD BE REPORTED IMMEDIATELY: *FEVER GREATER THAN 100.4 F (38 C) OR HIGHER *CHILLS OR SWEATING *NAUSEA AND VOMITING THAT IS NOT CONTROLLED WITH YOUR NAUSEA MEDICATION *UNUSUAL SHORTNESS OF BREATH *UNUSUAL BRUISING OR BLEEDING *URINARY PROBLEMS (pain or burning when urinating, or frequent urination) *BOWEL PROBLEMS (unusual diarrhea, constipation, pain near the anus) TENDERNESS IN MOUTH AND THROAT WITH OR WITHOUT PRESENCE OF ULCERS (sore throat, sores in mouth, or a toothache) UNUSUAL RASH, SWELLING OR PAIN  UNUSUAL VAGINAL DISCHARGE OR ITCHING   Items with * indicate a potential emergency and should be followed up as soon as possible or go to the Emergency Department if any problems should occur.  Please show the CHEMOTHERAPY ALERT CARD or IMMUNOTHERAPY ALERT CARD at check-in to  the Emergency Department and triage nurse.  Should you have questions after your visit or need to cancel or reschedule your appointment, please contact [facility]  Dept: [phone]  and follow the prompts.  Office hours are 8:00 a.m. to 4:30 p.m. Monday - Friday. Please note that voicemails left after 4:00 p.m. may not be returned until the following business day.  We are closed weekends and major holidays. You have access to a nurse at all times for urgent questions. Please call the main number to the clinic Dept: [phone] and follow the prompts.  For any non-urgent questions, you may also contact your provider using MyChart. We now offer e-Visits for anyone 13 and older to request care online for non-urgent symptoms. For details visit [email or web address].   Also download the MyChart app! Go to the app store, search "MyChart", open the app, select [facility], and log in with your MyChart username and password.  Due to Covid, a mask is required upon entering the hospital/clinic. If you do not have a mask, one will be given to you upon arrival. For doctor visits, patients may have 1 support person aged 77 or older with them. For treatment visits, patients cannot have anyone with them due to current Covid guidelines and our immunocompromised population.

## 2021-03-15 NOTE — Progress Notes (Signed)
Pt states she is having more body pain now, getting more prominent. 3/10 now but gets to 8/10 at times.   BP elevated, states she's had a headache this morning.   On both feet she has a toe nail that has burned black.  Fatigue and "brain fog" have been worse the past few weeks.

## 2021-03-15 NOTE — Progress Notes (Signed)
[facility] NOTE  Patient Care Team: [name], MD as PCP - General (Family Medicine)  CHIEF COMPLAINTS/PURPOSE OF CONSULTATION: Breast cancer  #  Oncology History Overview Note   #Right breast-invasive mammary carcinoma 2 o'clock position #1.1 cm-ER/PR HER2/neu positive [3+]; SEP, 2022-MRI-up to 3 cm non-mass enhancement noted.  #Left breast- MRI #upper outer quadrant- 61m #retroareolar 1cm-. NEGATIVE for malignancy   EProcedure: Lumpectomy  Specimen Laterality: Right  Histologic Type: Invasive ductal carcinoma  Histologic Grade:       Glandular (Acinar)/Tubular Differentiation: 3       Nuclear Pleomorphism: 2       Mitotic Rate: 1       Overall Grade: 2  Tumor Size: 0.9 cm  Ductal Carcinoma In Situ: Present, intermediate grade  Treatment Effect in the Breast: No known presurgical therapy  Margins: All margins negative for invasive carcinoma       Distance from Closest Margin (mm): Cannot be determined  DCIS Margins: Uninvolved by DCIS       Distance from Closest Margin (mm): Cannot be determined  Regional Lymph Nodes:       Number of Lymph Nodes Examined: 5       Number of Sentinel Nodes Examined: 5       Number of Lymph Nodes with Macrometastases (>2 mm): 0       Number of Lymph Nodes with Micrometastases: 0       Number of Lymph Nodes with Isolated Tumor Cells (=0.2 mm or =200  cells): 0       Size of Largest Metastatic Deposit (mm): Not applicable       Extranodal Extension: Not applicable  Distant Metastasis:       Distant Site(s) Involved: Not applicable  Breast Biomarker Testing Performed on Previous Biopsy:       Testing Performed on Case Number: [id number]             Estrogen Receptor: 95%, positive, strong staining intensity             Progesterone Receptor: 95%, positive, strong staining  intensity             HER2: Positive (3+)             Ki-67: 15%  Pathologic Stage Classification (pTNM, AJCC 8th Edition): pT1b, pN0   Representative Tumor Block: A4  Comment(s): None   # NOV 4th, 2022- TAXOL-[name] q W  ---------------------------------   # Headaches-Migraine/ Anxiety [[name]; Dr.Yan  botox]  #Genetic testing-s/p genetic counseling-expanded panel negative.  #Reproductive counseling: DECLINED reproductive endocrinology.     Carcinoma of upper-inner quadrant of right breast in female, estrogen receptor positive ([facility]  09/21/2020 Initial Diagnosis   [place]-inner quadrant of [place]   10/01/2020 Genetic Testing   Negative hereditary cancer genetic testing: no pathogenic variants detected in [name] and [name].  The report dates are October 01, 2020 and October 11, 2020, respectively.    The BRCAplus panel offered by [name] includes sequencing and deletion/duplication analysis for the following 8 genes: ATM, BRCA1, BRCA2, CDH1, CHEK2, PALB2, PTEN, and TP53.  The CustomNext-Cancer+RNAinsight panel offered by [employer] sequencing and rearrangement analysis for the following 47 genes:  APC, ATM, AXIN2, BARD1, BMPR1A, BRCA1, BRCA2, BRIP1, CDH1, CDK4, CDKN2A, CHEK2, DICER1, EPCAM, GREM1, HOXB13, MEN1, MLH1, MSH2, MSH3, MSH6, MUTYH, NBN, NF1, NF2, NTHL1, PALB2, PMS2, POLD1, POLE, PTEN, RAD51C, RAD51D, RECQL, RET, SDHA, SDHAF2,  SDHB, SDHC, SDHD, SMAD4, SMARCA4, STK11, TP53, TSC1, TSC2, and VHL.  RNA data is routinely analyzed for use in variant interpretation for all genes.   11/23/2020 -  Chemotherapy   Patient is on Treatment Plan : BREAST Paclitaxel + Trastuzumab q7d / Trastuzumab q21d        HISTORY OF PRESENTING ILLNESS: Ambulating independently.  Alone.  Alexandra Warren 41 y.o.  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  Patient finished her Taxol few weeks ago.  Patient will start maintenance [name]  today.  Anxious today; states that she is very tired;.  Complains of pain all over/muscle stiffness.  Patient is currently on tinazidine; tramadol 50 mg q 4 6 hours; meloxciam 15 mg once day.  Also complains of nail changes; brain foggy.    Review of Systems  Constitutional:  Negative for chills, diaphoresis, fever, malaise/fatigue and weight loss.  HENT:  Negative for nosebleeds and sore throat.   Eyes:  Negative for double vision.  Respiratory:  Negative for cough, hemoptysis, sputum production, shortness of breath and wheezing.   Cardiovascular:  Negative for chest pain, palpitations, orthopnea and leg swelling.  Gastrointestinal:  Negative for abdominal pain, blood in stool, constipation, diarrhea, heartburn, melena, nausea and vomiting.  Genitourinary:  Negative for dysuria, frequency and urgency.  Musculoskeletal:  Positive for myalgias. Negative for back pain and joint pain.  Skin:  Positive for itching and rash.  Neurological:  Positive for headaches. Negative for dizziness, tingling, focal weakness and weakness.  Endo/Heme/Allergies:  Does not bruise/bleed easily.  Psychiatric/Behavioral:  Negative for depression. The patient is nervous/anxious and has insomnia.   All other systems reviewed and are negative.   MEDICAL HISTORY:  Past Medical History:  Diagnosis Date   Acid reflux    Anxiety and depression    Dr. Gala Murdoch, MD   Back pain    Family history of breast cancer 09/21/2020   Family history of malignant neoplasm of digestive organ 09/21/2020   Fibromyalgia    History of gallstones    Malignant neoplasm of upper-inner quadrant of right female breast ([place]) 09/21/2020   Migraine     SURGICAL HISTORY: Past Surgical History:  Procedure Laterality Date   BREAST LUMPECTOMY WITH RADIOACTIVE SEED LOCALIZATION Right 10/17/2020   Procedure: RIGHT BREAST LUMPECTOMY WITH RADIOACTIVE SEED LOCALIZATION WITH RIGHT SENTINEL LYMPH NODE BIOPSY;  Surgeon: Stark Klein, MD;   Location: [facility];  Service: General;  Laterality: Right;   CHOLECYSTECTOMY  12/2004   PORTACATH PLACEMENT Left 10/17/2020   Procedure: INSERTION PORT-A-CATH;  Surgeon: Stark Klein, MD;  Location: [facility];  Service: General;  Laterality: Left;    SOCIAL HISTORY: Social History   Socioeconomic History   Marital status: Divorced    Spouse name: Corene Cornea   Number of children: 0   Years of education: MS   Highest education level: Not on file  Occupational History   Occupation: Optometrist  Tobacco Use   Smoking status: Never   Smokeless tobacco: Never  Vaping Use   Vaping Use: Never used  Substance and Sexual Activity   Alcohol use: Not Currently    Comment: pt states 1 glass of wine maybe once a month   Drug use: No   Sexual activity: Yes    Birth control/protection: Condom  Other Topics Concern   Not on file  Social History Narrative   Accountant for commercial firm; wants short term disability;  never smoked;  2 drinks a month. Lives in [place] with boy friend [[place] No children. Has one brother    Social Determinants of Radio broadcast assistant Strain: Not on file  Food Insecurity: Not on file  Transportation Needs: Not on file  Physical Activity: Not on file  Stress: Not on file  Social Connections: Not on file  Intimate Partner Violence: Not on file    FAMILY HISTORY: Family History  Problem Relation Age of Onset   Healthy Mother    Hypertension Father    Skin cancer Brother        dx before 28   Colon cancer Maternal Aunt 50   Cervical cancer Maternal Aunt        dx before 65; x2   Cancer Paternal Grandmother 99       unknown primary; ? liver   Lung cancer Paternal Grandfather        dx after 4   Breast cancer Cousin 41       maternal cousin   Colon cancer Other        MGM's sister   Gastric cancer Other        PGM's sister   Leukemia Other        PMG's sister   Rectal cancer Neg Hx    Esophageal cancer Neg Hx     ALLERGIES:   is allergic to antihistamines, diphenhydramine-type; diphenhydramine hcl; promethazine; and pseudoeph-doxylamine-dm-apap.  MEDICATIONS:  Current Outpatient Medications  Medication Sig Dispense Refill   almotriptan (AXERT) 12.5 MG tablet Take 1 tab at onset of migraine.  May repeat in 2 hrs, if needed.  Max dose: 2 tabs/day. This is a 30 day prescription. 12 tablet 11   azelastine (ASTELIN) 0.1 % nasal spray Place 1 spray into both nostrils daily as needed for rhinitis.     BOTOX 200 units SOLR Inject 200 Units into the skin See admin instructions. Every 90 days     butalbital-acetaminophen-caffeine (FIORICET) 50-325-40 MG tablet Take 1 tablet by mouth every 8 (eight) hours as needed for headache. 10 tablet 5   clindamycin (CLEOCIN T) 1 % external solution Apply topically 2 (two) times daily as needed.     clonazePAM (KLONOPIN) 1 MG tablet Take 0.5-1 mg by mouth 2 (two) times daily as needed for anxiety.     diazepam (VALIUM) 5 MG tablet One pill 45-60 mins prior to procedure; 1 pill 15 mins prior if needed. 6 tablet 0   EMGALITY 120 MG/ML SOAJ Inject 120 mg into the skin every 30 (thirty) days. 3 mL 4   fluconazole (DIFLUCAN) 100 MG tablet TAKE 1 TABLET(100 MG) BY MOUTH DAILY AS NEEDED FOR YEAST INFECTION 6 tablet 0   fluconazole (DIFLUCAN) 100 MG tablet Take 1 tablet (100 mg total) by mouth daily. 10 tablet 0   lidocaine-prilocaine (EMLA) cream Apply 1 application topically as needed. 30 g 4   omeprazole (PRILOSEC) 20 MG capsule Take 20 mg by mouth daily.     ondansetron (ZOFRAN) 8 MG tablet Take 1 tablet (8 mg total) by mouth every 8 (eight) hours as needed for nausea or vomiting. 30 tablet 3   potassium chloride SA (KLOR-CON M) 20 MEQ tablet 1 pill twice a day 30 tablet 3   predniSONE (DELTASONE) 10 MG tablet Take 1 tablet (10 mg total) by mouth daily with breakfast. 30 tablet 0   prochlorperazine (COMPAZINE) 10 MG tablet Take 1 tablet (10 mg total) by mouth every 6 (six)  hours as needed for  nausea or vomiting. 40 tablet 1   rosuvastatin (CRESTOR) 20 MG tablet Take 20 mg by mouth daily.     tiZANidine (ZANAFLEX) 4 MG tablet Take 1 tablet (4 mg total) by mouth every 8 (eight) hours as needed for muscle spasms (migraines). This is a 30 day rx. 20 tablet 5   topiramate (TOPAMAX) 100 MG tablet Take 1 tablet (100 mg total) by mouth 2 (two) times daily. (Patient taking differently: Take 200 mg by mouth at bedtime.) 60 tablet 11   traMADol (ULTRAM) 50 MG tablet Take 50 mg by mouth every 6 (six) hours as needed for pain.     vortioxetine HBr (TRINTELLIX) 20 MG TABS tablet Take 20 mg by mouth daily.  0   zolpidem (AMBIEN) 10 MG tablet Take 10 mg by mouth at bedtime as needed for sleep.     No current facility-administered medications for this visit.   Facility-Administered Medications Ordered in Other Visits  Medication Dose Route Frequency Provider Last Rate Last Admin   diphenhydrAMINE (BENADRYL) capsule 50 mg  50 mg Oral Once Charlaine Dalton R, MD       sodium chloride flush (NS) 0.9 % injection 10 mL  10 mL Intracatheter PRN Cammie Sickle, MD   10 mL at 03/15/21 1055   PHYSICAL EXAMINATION: ECOG PERFORMANCE STATUS: 0 - Asymptomatic  Vitals:   03/15/21 0959  BP: (!) 123/100  Pulse: 97  Temp: 98.9 F (37.2 C)  SpO2: 99%     Filed Weights   03/15/21 0959  Weight: 186 lb 6.4 oz (84.6 kg)    Papular macular rash noted on the scalp.  Physical Exam Vitals and nursing note reviewed.  HENT:     Head: Normocephalic and atraumatic.     Mouth/Throat:     Pharynx: Oropharynx is clear.  Eyes:     Extraocular Movements: Extraocular movements intact.     Pupils: Pupils are equal, round, and reactive to light.  Cardiovascular:     Rate and Rhythm: Normal rate and regular rhythm.  Abdominal:     Palpations: Abdomen is soft.  Musculoskeletal:        General: Normal range of motion.     Cervical back: Normal range of motion.  Skin:    General: Skin is warm.   Neurological:     General: No focal deficit present.     Mental Status: She is alert and oriented to person, place, and time.  Psychiatric:        Behavior: Behavior normal.        Judgment: Judgment normal.     LABORATORY DATA:  I have reviewed the data as listed Lab Results  Component Value Date   WBC 8.1 03/15/2021   HGB 13.2 03/15/2021   HCT 39.3 03/15/2021   MCV 95.9 03/15/2021   PLT 366 03/15/2021   Recent Labs    02/15/21 0844 02/22/21 0844 03/15/21 0945  NA 137 137 136  K 3.8 3.6 3.8  CL 107 108 107  CO2 _0 GLUCOSE 121* 120* 110*  BUN _1 CREATININE 0.78 0.73 0.90  CALCIUM 9.1 9.1 9.0  GFRNONAA >60 >60 >60  PROT 6.8 7.0 6.8  ALBUMIN 3.9 4.2 3.9  AST 32 37 39  ALT 44 63* 67*  ALKPHOS 49 47 52  BILITOT 0.3 0.2* 0.2*    RADIOGRAPHIC STUDIES: I have personally reviewed the radiological images as listed and agreed with the findings in  the report. Chemo Denervation  Result Date: 02/26/2021 Alexandra Cloud, NP     02/27/2021 11:35 AM See Dr. Rhea Belton note    ASSESSMENT & PLAN:   Carcinoma of upper-inner quadrant of right breast in female, estrogen receptor positive ([place]) # STAGE-I- Right breast-invasive mammary carcinoma- pT1bN0 [54m]- ER/PR-Positive; Her-2 positive.Proceed with Taxol [name]-weekly x12 followed by [name] every 3 weeks for 12 months.OCT 2022-  MUGA scan- 58%. S/p Taxol-Herpcetin [FEB 20737] curently on mainatance [name] q 3W  #Proceed with [name] today.  Labs today reviewed;  acceptable for treatment today.  Check Echo on 2/28 [no muga]   # Elevated BP- [? Phenylephirine]-repeat diastolic 910G recommend discontinue DayQuil. Hypertension-poorly controlled-diastolic 1269  checking blood pressures at home frequently.keep a log of blood pressures/and bring it to PCPs attention.    # Mild Hypokalemia-myalgia-  3.4-on  Kdur 20 BID; improved- K-3.6.   # AST elevation- G-1 from Taxol- monitor fo now-stable.  # Nausea- no  vomiting-improved with antiemetics-STABLE  # Diarrhea- G-1-2; immodium- STABLE   # Headaches/Migraines-on Fioricet/amlotriptan; and also Emgalti SQ.[? Low K]-STABLE; awaiting next week.   # Fatigue/ Fibromyalgia-worse ok to proceed with  Injections/ortho; worse- recommend increase meloxicam to 15 mg BID.   #Toenail changes likely from Taxol-no evidence of paronychia.  Recommend Epsom salt bath.  # DISPOSITION: # chemo today.  #  3 weeks-  MD-; labs- cbc/cmp; [name]-Dr.B   All questions were answered. The patient/family knows to call the clinic with any problems, questions or concerns.     GCammie Sickle MD 03/15/2021 3:47 PM

## 2021-03-15 NOTE — Assessment & Plan Note (Addendum)
#  STAGE-I- Right breast-invasive mammary carcinoma- pT1bN0 [110mm]- ER/PR-Positive; Her-2 positive.Proceed with Taxol [name]-weekly x12 followed by [name] every 3 weeks for 12 months.OCT 2022-  MUGA scan- 58%. S/p Taxol-Herpcetin [FEB 8473]; curently on mainatance [name] q 3W  #Proceed with [name] today.  Labs today reviewed;  acceptable for treatment today.  Check Echo on 2/28 [no muga]   # Elevated BP- [? Phenylephirine]-repeat diastolic 08L; recommend discontinue DayQuil. Hypertension-poorly controlled-diastolic 694;  checking blood pressures at home frequently.keep a log of blood pressures/and bring it to PCPs attention.    # Mild Hypokalemia-myalgia-  3.4-on  Kdur 20 BID; improved- K-3.6.   # AST elevation- G-1 from Taxol- monitor fo now-stable.  # Nausea- no vomiting-improved with antiemetics-STABLE  # Diarrhea- G-1-2; immodium- STABLE   # Headaches/Migraines-on Fioricet/amlotriptan; and also Emgalti SQ.[? Low K]-STABLE; awaiting next week.   # Fatigue/ Fibromyalgia-worse ok to proceed with  Injections/ortho; worse- recommend increase meloxicam to 15 mg BID.   #Toenail changes likely from Taxol-no evidence of paronychia.  Recommend Epsom salt bath.  # DISPOSITION: # chemo today.  #  3 weeks-  MD-; labs- cbc/cmp; [name]-Dr.B

## 2021-03-16 ENCOUNTER — Encounter: Payer: Self-pay | Admitting: Internal Medicine

## 2021-03-18 ENCOUNTER — Encounter: Payer: Self-pay | Admitting: Internal Medicine

## 2021-03-18 DIAGNOSIS — C50211 Malignant neoplasm of upper-inner quadrant of right female breast: Secondary | ICD-10-CM | POA: Diagnosis not present

## 2021-03-19 ENCOUNTER — Other Ambulatory Visit: Payer: Self-pay

## 2021-03-19 ENCOUNTER — Ambulatory Visit
Admission: RE | Admit: 2021-03-19 | Discharge: 2021-03-19 | Disposition: A | Discharge status: Home / Self Care | Payer: PRIVATE HEALTH INSURANCE | Source: Ambulatory Visit | Attending: Internal Medicine | Admitting: Internal Medicine

## 2021-03-19 DIAGNOSIS — Z0189 Encounter for other specified special examinations: Secondary | ICD-10-CM

## 2021-03-19 DIAGNOSIS — Z9221 Personal history of antineoplastic chemotherapy: Secondary | ICD-10-CM | POA: Diagnosis not present

## 2021-03-19 DIAGNOSIS — C50211 Malignant neoplasm of upper-inner quadrant of right female breast: Secondary | ICD-10-CM | POA: Diagnosis present

## 2021-03-19 DIAGNOSIS — Z170 Estrogen receptor positive status [ER+]: Secondary | ICD-10-CM | POA: Diagnosis not present

## 2021-03-19 DIAGNOSIS — Z08 Encounter for follow-up examination after completed treatment for malignant neoplasm: Secondary | ICD-10-CM | POA: Diagnosis not present

## 2021-03-19 LAB — ECHOCARDIOGRAM COMPLETE
AR max vel: 2.97 cm2
AV Area VTI: 3.27 cm2
AV Area mean vel: 3.26 cm2
AV Mean grad: 3 mmHg
AV Peak grad: 5.2 mmHg
Ao pk vel: 1.14 m/s
Area-P 1/2: 4.01 cm2
MV VTI: 4.32 cm2
S' Lateral: 2.17 cm

## 2021-03-19 NOTE — Progress Notes (Signed)
*  PRELIMINARY RESULTS* Echocardiogram 2D Echocardiogram has been performed.  Alexandra Warren 03/19/2021, 10:58 AM

## 2021-03-25 ENCOUNTER — Ambulatory Visit: Payer: PRIVATE HEALTH INSURANCE

## 2021-03-25 ENCOUNTER — Ambulatory Visit: Admission: RE | Admit: 2021-03-25 | Payer: PRIVATE HEALTH INSURANCE | Source: Ambulatory Visit

## 2021-03-25 DIAGNOSIS — Z5112 Encounter for antineoplastic immunotherapy: Secondary | ICD-10-CM | POA: Diagnosis present

## 2021-03-25 DIAGNOSIS — C50211 Malignant neoplasm of upper-inner quadrant of right female breast: Secondary | ICD-10-CM | POA: Insufficient documentation

## 2021-03-25 DIAGNOSIS — Z79899 Other long term (current) drug therapy: Secondary | ICD-10-CM | POA: Diagnosis not present

## 2021-03-26 ENCOUNTER — Ambulatory Visit
Admission: RE | Admit: 2021-03-26 | Discharge: 2021-03-26 | Disposition: A | Discharge status: Home / Self Care | Payer: PRIVATE HEALTH INSURANCE | Source: Ambulatory Visit | Attending: Radiation Oncology | Admitting: Radiation Oncology

## 2021-03-26 DIAGNOSIS — Z5112 Encounter for antineoplastic immunotherapy: Secondary | ICD-10-CM | POA: Diagnosis not present

## 2021-03-27 ENCOUNTER — Ambulatory Visit
Admission: RE | Admit: 2021-03-27 | Discharge: 2021-03-27 | Disposition: A | Discharge status: Home / Self Care | Payer: PRIVATE HEALTH INSURANCE | Source: Ambulatory Visit | Attending: Radiation Oncology | Admitting: Radiation Oncology

## 2021-03-27 DIAGNOSIS — Z5112 Encounter for antineoplastic immunotherapy: Secondary | ICD-10-CM | POA: Diagnosis not present

## 2021-03-28 ENCOUNTER — Ambulatory Visit
Admission: RE | Admit: 2021-03-28 | Discharge: 2021-03-28 | Disposition: A | Discharge status: Home / Self Care | Payer: PRIVATE HEALTH INSURANCE | Source: Ambulatory Visit | Attending: Radiation Oncology | Admitting: Radiation Oncology

## 2021-03-28 DIAGNOSIS — Z5112 Encounter for antineoplastic immunotherapy: Secondary | ICD-10-CM | POA: Diagnosis not present

## 2021-03-29 ENCOUNTER — Ambulatory Visit
Admission: RE | Admit: 2021-03-29 | Discharge: 2021-03-29 | Disposition: A | Discharge status: Home / Self Care | Payer: PRIVATE HEALTH INSURANCE | Source: Ambulatory Visit | Attending: Radiation Oncology | Admitting: Radiation Oncology

## 2021-03-29 DIAGNOSIS — Z5112 Encounter for antineoplastic immunotherapy: Secondary | ICD-10-CM | POA: Diagnosis not present

## 2021-04-01 ENCOUNTER — Ambulatory Visit
Admission: RE | Admit: 2021-04-01 | Discharge: 2021-04-01 | Disposition: A | Discharge status: Home / Self Care | Payer: PRIVATE HEALTH INSURANCE | Source: Ambulatory Visit | Attending: Radiation Oncology | Admitting: Radiation Oncology

## 2021-04-01 DIAGNOSIS — Z5112 Encounter for antineoplastic immunotherapy: Secondary | ICD-10-CM | POA: Diagnosis not present

## 2021-04-02 ENCOUNTER — Ambulatory Visit
Admission: RE | Admit: 2021-04-02 | Discharge: 2021-04-02 | Disposition: A | Discharge status: Home / Self Care | Payer: PRIVATE HEALTH INSURANCE | Source: Ambulatory Visit | Attending: Radiation Oncology | Admitting: Radiation Oncology

## 2021-04-02 DIAGNOSIS — Z5112 Encounter for antineoplastic immunotherapy: Secondary | ICD-10-CM | POA: Diagnosis not present

## 2021-04-03 ENCOUNTER — Ambulatory Visit
Admission: RE | Admit: 2021-04-03 | Discharge: 2021-04-03 | Disposition: A | Discharge status: Home / Self Care | Payer: PRIVATE HEALTH INSURANCE | Source: Ambulatory Visit | Attending: Radiation Oncology | Admitting: Radiation Oncology

## 2021-04-03 DIAGNOSIS — Z5112 Encounter for antineoplastic immunotherapy: Secondary | ICD-10-CM | POA: Diagnosis not present

## 2021-04-04 ENCOUNTER — Ambulatory Visit
Admission: RE | Admit: 2021-04-04 | Discharge: 2021-04-04 | Disposition: A | Discharge status: Home / Self Care | Payer: PRIVATE HEALTH INSURANCE | Source: Ambulatory Visit | Attending: Radiation Oncology | Admitting: Radiation Oncology

## 2021-04-04 DIAGNOSIS — Z5112 Encounter for antineoplastic immunotherapy: Secondary | ICD-10-CM | POA: Diagnosis not present

## 2021-04-05 ENCOUNTER — Ambulatory Visit
Admission: RE | Admit: 2021-04-05 | Discharge: 2021-04-05 | Disposition: A | Discharge status: Home / Self Care | Payer: PRIVATE HEALTH INSURANCE | Source: Ambulatory Visit | Attending: Radiation Oncology | Admitting: Radiation Oncology

## 2021-04-05 ENCOUNTER — Encounter: Payer: Self-pay | Admitting: Internal Medicine

## 2021-04-05 ENCOUNTER — Inpatient Hospital Stay: Payer: PRIVATE HEALTH INSURANCE

## 2021-04-05 ENCOUNTER — Inpatient Hospital Stay: Payer: PRIVATE HEALTH INSURANCE | Attending: Internal Medicine

## 2021-04-05 ENCOUNTER — Other Ambulatory Visit: Payer: Self-pay

## 2021-04-05 ENCOUNTER — Inpatient Hospital Stay (HOSPITAL_BASED_OUTPATIENT_CLINIC_OR_DEPARTMENT_OTHER): Payer: PRIVATE HEALTH INSURANCE | Admitting: Internal Medicine

## 2021-04-05 VITALS — HR 69

## 2021-04-05 DIAGNOSIS — Z170 Estrogen receptor positive status [ER+]: Secondary | ICD-10-CM | POA: Diagnosis not present

## 2021-04-05 DIAGNOSIS — C50211 Malignant neoplasm of upper-inner quadrant of right female breast: Secondary | ICD-10-CM | POA: Diagnosis not present

## 2021-04-05 DIAGNOSIS — Z79899 Other long term (current) drug therapy: Secondary | ICD-10-CM | POA: Insufficient documentation

## 2021-04-05 DIAGNOSIS — Z5112 Encounter for antineoplastic immunotherapy: Secondary | ICD-10-CM | POA: Insufficient documentation

## 2021-04-05 LAB — CBC WITH DIFFERENTIAL/PLATELET
Abs Immature Granulocytes: 0.03 10*3/uL (ref 0.00–0.07)
Basophils Absolute: 0.1 10*3/uL (ref 0.0–0.1)
Basophils Relative: 1 %
Eosinophils Absolute: 0.4 10*3/uL (ref 0.0–0.5)
Eosinophils Relative: 4 %
HCT: 38.6 % (ref 36.0–46.0)
Hemoglobin: 13.1 g/dL (ref 12.0–15.0)
Immature Granulocytes: 0 %
Lymphocytes Relative: 43 %
Lymphs Abs: 3.5 10*3/uL (ref 0.7–4.0)
MCH: 31.9 pg (ref 26.0–34.0)
MCHC: 33.9 g/dL (ref 30.0–36.0)
MCV: 93.9 fL (ref 80.0–100.0)
Monocytes Absolute: 0.5 10*3/uL (ref 0.1–1.0)
Monocytes Relative: 6 %
Neutro Abs: 3.7 10*3/uL (ref 1.7–7.7)
Neutrophils Relative %: 46 %
Platelets: 300 10*3/uL (ref 150–400)
RBC: 4.11 MIL/uL (ref 3.87–5.11)
RDW: 12 % (ref 11.5–15.5)
WBC: 8.2 10*3/uL (ref 4.0–10.5)
nRBC: 0 % (ref 0.0–0.2)

## 2021-04-05 LAB — COMPREHENSIVE METABOLIC PANEL
ALT: 46 U/L — ABNORMAL HIGH (ref 0–44)
AST: 42 U/L — ABNORMAL HIGH (ref 15–41)
Albumin: 3.9 g/dL (ref 3.5–5.0)
Alkaline Phosphatase: 54 U/L (ref 38–126)
Anion gap: 8 (ref 5–15)
BUN: 15 mg/dL (ref 6–20)
CO2: 20 mmol/L — ABNORMAL LOW (ref 22–32)
Calcium: 8.6 mg/dL — ABNORMAL LOW (ref 8.9–10.3)
Chloride: 109 mmol/L (ref 98–111)
Creatinine, Ser: 0.82 mg/dL (ref 0.44–1.00)
GFR, Estimated: >60 mL/min (ref >60)
Glucose, Bld: 114 mg/dL — ABNORMAL HIGH (ref 70–99)
Potassium: 3.2 mmol/L — ABNORMAL LOW (ref 3.5–5.1)
Sodium: 137 mmol/L (ref 135–145)
Total Bilirubin: 0.3 mg/dL (ref 0.3–1.2)
Total Protein: 6.6 g/dL (ref 6.5–8.1)

## 2021-04-05 LAB — PREGNANCY, URINE: Preg Test, Ur: NEGATIVE

## 2021-04-05 MED ORDER — HEPARIN SOD (PORK) LOCK FLUSH 100 UNIT/ML IV SOLN
500.0000 [IU] | Freq: Once | INTRAVENOUS | Status: AC | PRN
  Administered 2021-04-05: 500 [IU]
  Filled 2021-04-05: qty 5

## 2021-04-05 MED ORDER — DIPHENHYDRAMINE HCL 25 MG PO CAPS: 50.0000 mg | ORAL_CAPSULE | Freq: Once | ORAL | Status: DC

## 2021-04-05 MED ORDER — SODIUM CHLORIDE 0.9 % IV SOLN
Freq: Once | INTRAVENOUS | Status: AC
  Filled 2021-04-05: qty 250

## 2021-04-05 MED ORDER — TRASTUZUMAB-DKST CHEMO 150 MG IV SOLR
6.0000 mg/kg | Freq: Once | INTRAVENOUS | Status: AC
  Administered 2021-04-05: 483 mg via INTRAVENOUS
  Filled 2021-04-05: qty 23

## 2021-04-05 MED ORDER — ACETAMINOPHEN 325 MG PO TABS: 650.0000 mg | ORAL_TABLET | Freq: Once | ORAL | Status: DC

## 2021-04-05 NOTE — Assessment & Plan Note (Addendum)
#  STAGE-I- Right breast-invasive mammary carcinoma- pT1bN0 [59mm]- ER/PR-Positive; Her-2 positive.Proceed with Taxol [name]-weekly x12 followed by [name] every 3 weeks for 12 months.OCT 2022-  MUGA scan- 58%. S/p Taxol-Herpcetin [FEB 7867]; curently on mainatance [name] q 3W ? ?#Proceed with [name] today.  Labs today reviewed;  acceptable for treatment today.  Echo on 2/28 [no muga]- 60-65%. Currently on RT until 4/07.  ? ?# Mild Hypokalemia-myalgia-  3.2-recommend increasing to Kdur 20 BID.  ? ?# AST elevation- G-1 from Taxol- monitor fo now-stable. ? ?# Nausea- no vomiting-improved with antiemetics-stable ? ?# Diarrhea- G-1-2; immodium- stable  ? ?# Skin rash- ? herceptin- continue clinadmycoin gel as per dermatologist. ? ?# Headaches/Migraines-on Fioricet/amlotriptan; and also Emgalti SQ.[? Low K]-stable  ? ?# Fatigue/ Fibromyalgia-worse ok to proceed with  Injections/ortho; worse- recommend increase meloxicam to 15 mg BID.  ? ?#Toenail changes likely from Taxol-no evidence of paronychia.s/p Epsom salt bath. ? ?# DISPOSITION: ?# chemo today.  ?#  3 weeks- X MD/NP-; labs- cbc/cmp; [name] ?# Follow up in 6 weeks- MD; labs-cbc/cmp;[name]--Dr.B ?

## 2021-04-05 NOTE — Progress Notes (Signed)
[facility] ?CONSULT NOTE ? ?Patient Care Team: ?[name], MD as PCP - General (Family Medicine) ? ?CHIEF COMPLAINTS/PURPOSE OF CONSULTATION: Breast cancer ? ?#  ?Oncology History Overview Note  ? #Right breast-invasive mammary carcinoma 2 o'clock position #1.1 cm-ER/PR HER2/neu positive [3+]; SEP, 2022-MRI-up to 3 cm non-mass enhancement noted. ? ?#Left breast- MRI #upper outer quadrant- 60m #retroareolar 1cm-. NEGATIVE for malignancy ? ? EProcedure: Lumpectomy  ?Specimen Laterality: Right  ?Histologic Type: Invasive ductal carcinoma  ?Histologic Grade:  ?     Glandular (Acinar)/Tubular Differentiation: 3  ?     Nuclear Pleomorphism: 2  ?     Mitotic Rate: 1  ?     Overall Grade: 2  ?Tumor Size: 0.9 cm  ?Ductal Carcinoma In Situ: Present, intermediate grade  ?Treatment Effect in the Breast: No known presurgical therapy  ?Margins: All margins negative for invasive carcinoma  ?     Distance from Closest Margin (mm): Cannot be determined  ?DCIS Margins: Uninvolved by DCIS  ?     Distance from Closest Margin (mm): Cannot be determined  ?Regional Lymph Nodes:  ?     Number of Lymph Nodes Examined: 5  ?     Number of Sentinel Nodes Examined: 5  ?     Number of Lymph Nodes with Macrometastases (>2 mm): 0  ?     Number of Lymph Nodes with Micrometastases: 0  ?     Number of Lymph Nodes with Isolated Tumor Cells (=0.2 mm or =200  ?cells): 0  ?     Size of Largest Metastatic Deposit (mm): Not applicable  ?     Extranodal Extension: Not applicable  ?Distant Metastasis:  ?     Distant Site(s) Involved: Not applicable  ?Breast Biomarker Testing Performed on Previous Biopsy:  ?     Testing Performed on Case Number: [id number]  ?           Estrogen Receptor: 95%, positive, strong staining intensity  ?           Progesterone Receptor: 95%, positive, strong staining  ?intensity  ?           HER2: Positive (3+)  ?           Ki-67: 15%  ?Pathologic Stage Classification (pTNM, AJCC 8th Edition): pT1b, pN0   ?Representative Tumor Block: A4  ?Comment(s): None  ? ?# NOV 4th, 2022- TAXOL-[name] q W ? ?---------------------------------  ? ?# Headaches-Migraine/ Anxiety [[name]; Dr.Yan  botox] ? ?#Genetic testing-s/p genetic counseling-expanded panel negative. ? ?#Reproductive counseling: DECLINED reproductive endocrinology.   ?  ?Carcinoma of upper-inner quadrant of right breast in [place], estrogen receptor positive ([place]  ?09/21/2020 Initial Diagnosis  ? Malignant neoplasm of upper-inner quadrant of right female breast ([facility] ?  ?10/01/2020 Genetic Testing  ? Negative hereditary cancer genetic testing: no pathogenic variants detected in [name] and [name].  The report dates are October 01, 2020 and October 11, 2020, respectively.   ? ?The BRCAplus panel offered by [name] includes sequencing and deletion/duplication analysis for the following 8 genes: ATM, BRCA1, BRCA2, CDH1, CHEK2, PALB2, PTEN, and TP53.  The CustomNext-Cancer+RNAinsight panel offered by [employer] sequencing and rearrangement analysis for the following 47 genes:  APC, ATM, AXIN2, BARD1, BMPR1A, BRCA1, BRCA2, BRIP1, CDH1, CDK4, CDKN2A, CHEK2, DICER1, EPCAM, GREM1, HOXB13, MEN1, MLH1, MSH2, MSH3, MSH6, MUTYH, NBN, NF1, NF2, NTHL1, PALB2, PMS2, POLD1, POLE, PTEN, RAD51C, RAD51D, RECQL, RET, SDHA, SDHAF2,  SDHB, SDHC, SDHD, SMAD4, SMARCA4, STK11, TP53, TSC1, TSC2, and VHL.  RNA data is routinely analyzed for use in variant interpretation for all genes. ?  ?11/23/2020 -  Chemotherapy  ? Patient is on Treatment Plan : BREAST Paclitaxel + Trastuzumab q7d / Trastuzumab q21d  ?   ? ? ? ?HISTORY OF PRESENTING ILLNESS: Ambulating independently.  Alone. ? ?SHENELL ROGALSKI 41 y.o.  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 ? ?Continues to complain of intermittent rash she attributes to  [name].  The rash is mostly on the face.  Improved with clindamycin gel/dermatology. ? ?Chronic fibromyalgia patient is currently on tinazidine; tramadol 50 mg q 4 6 hours; meloxciam 15 mg once day.  No further nail changes. ? ?Review of Systems  ?Constitutional:  Negative for chills, diaphoresis, fever, malaise/fatigue and weight loss.  ?HENT:  Negative for nosebleeds and sore throat.   ?Eyes:  Negative for double vision.  ?Respiratory:  Negative for cough, hemoptysis, sputum production, shortness of breath and wheezing.   ?Cardiovascular:  Negative for chest pain, palpitations, orthopnea and leg swelling.  ?Gastrointestinal:  Negative for abdominal pain, blood in stool, constipation, diarrhea, heartburn, melena, nausea and vomiting.  ?Genitourinary:  Negative for dysuria, frequency and urgency.  ?Musculoskeletal:  Positive for myalgias. Negative for back pain and joint pain.  ?Skin:  Positive for itching and rash.  ?Neurological:  Positive for headaches. Negative for dizziness, tingling, focal weakness and weakness.  ?Endo/Heme/Allergies:  Does not bruise/bleed easily.  ?Psychiatric/Behavioral:  Negative for depression. The patient is nervous/anxious and has insomnia.   ?All other systems reviewed and are negative.  ? ?MEDICAL HISTORY:  ?Past Medical History:  ?Diagnosis Date  ? Acid reflux   ? Anxiety and depression   ? Dr. Gala Murdoch, MD  ? Back pain   ? Family history of breast cancer 09/21/2020  ? Family history of malignant neoplasm of digestive organ 09/21/2020  ? Fibromyalgia   ? History of gallstones   ? Malignant neoplasm of upper-inner quadrant of right female breast ([place]) 09/21/2020  ? Migraine   ? ? ?SURGICAL HISTORY: ?Past Surgical History:  ?Procedure Laterality Date  ? BREAST LUMPECTOMY WITH RADIOACTIVE SEED LOCALIZATION Right 10/17/2020  ? Procedure: RIGHT BREAST LUMPECTOMY WITH RADIOACTIVE SEED LOCALIZATION WITH RIGHT SENTINEL LYMPH NODE BIOPSY;  Surgeon: Stark Klein, MD;  Location: [facility];   Service: General;  Laterality: Right;  ? CHOLECYSTECTOMY  12/2004  ? PORTACATH PLACEMENT Left 10/17/2020  ? Procedure: INSERTION PORT-A-CATH;  Surgeon: Stark Klein, MD;  Location: [place];  Service: General;  Laterality: Left;  ? ? ?SOCIAL HISTORY: ?Social History  ? ?Socioeconomic History  ? Marital status: Divorced  ?  Spouse name: Corene Cornea  ? Number of children: 0  ? Years of education: MS  ? Highest education level: Not on file  ?Occupational History  ? Occupation: accountant  ?Tobacco Use  ? Smoking status: Never  ? Smokeless tobacco: Never  ?Vaping Use  ? Vaping Use: Never used  ?Substance and Sexual Activity  ? Alcohol use: Not Currently  ?  Comment: pt states 1 glass of wine maybe once a month  ? Drug use: No  ? Sexual activity: Yes  ?  Birth control/protection: Condom  ?Other Topics Concern  ? Not on file  ?Social History Narrative  ? Accountant for commercial firm; wants short term disability;  never smoked; 2 drinks a month. Lives in [place] with boy  friend [place] No children. Has one brother   ? ?Social Determinants of Health  ? ?Financial Resource Strain: Not on file  ?Food Insecurity: Not on file  ?Transportation Needs: Not on file  ?Physical Activity: Not on file  ?Stress: Not on file  ?Social Connections: Not on file  ?Intimate Partner Violence: Not on file  ? ? ?FAMILY HISTORY: ?Family History  ?Problem Relation Age of Onset  ? Healthy Mother   ? Hypertension Father   ? Skin cancer Brother   ?     dx before 32  ? Colon cancer Maternal Aunt 59  ? Cervical cancer Maternal Aunt   ?     dx before 29; x2  ? Cancer Paternal Grandmother 35  ?     unknown primary; ? liver  ? Lung cancer Paternal Grandfather   ?     dx after 58  ? Breast cancer Cousin 47  ?     maternal cousin  ? Colon cancer Other   ?     MGM's sister  ? Gastric cancer Other   ?     PGM's sister  ? Leukemia Other   ?     PMG's sister  ? Rectal cancer Neg Hx   ? Esophageal cancer Neg Hx   ? ? ?ALLERGIES:  is allergic to  antihistamines, diphenhydramine-type; diphenhydramine hcl; promethazine; and pseudoeph-doxylamine-dm-apap. ? ?MEDICATIONS:  ?Current Outpatient Medications  ?Medication Sig Dispense Refill  ? almotriptan (AXERT) 12.5 MG

## 2021-04-08 ENCOUNTER — Ambulatory Visit: Payer: PRIVATE HEALTH INSURANCE

## 2021-04-09 ENCOUNTER — Other Ambulatory Visit: Payer: Self-pay

## 2021-04-09 ENCOUNTER — Ambulatory Visit
Admission: RE | Admit: 2021-04-09 | Discharge: 2021-04-09 | Disposition: A | Discharge status: Home / Self Care | Payer: PRIVATE HEALTH INSURANCE | Source: Ambulatory Visit | Attending: Emergency Medicine | Admitting: Emergency Medicine

## 2021-04-09 ENCOUNTER — Ambulatory Visit: Payer: PRIVATE HEALTH INSURANCE

## 2021-04-09 VITALS — BP 120/86 | HR 92 | Temp 98.2°F | Resp 18

## 2021-04-09 DIAGNOSIS — Z20822 Contact with and (suspected) exposure to covid-19: Secondary | ICD-10-CM

## 2021-04-09 DIAGNOSIS — B349 Viral infection, unspecified: Secondary | ICD-10-CM

## 2021-04-09 NOTE — ED Triage Notes (Signed)
Pt is cancer pt sent by oncologist for COVID testing due to direct exposure to someone in household and cough x 2 days.  ?

## 2021-04-09 NOTE — Discharge Instructions (Addendum)
Your COVID test is pending.  You should self quarantine until the test result is back.   ? ?Take Tylenol as needed for fever or discomfort.  Rest and keep yourself hydrated.   ? ?Follow-up with your primary care provider if your symptoms are not improving.   ? ? ?

## 2021-04-09 NOTE — ED Provider Notes (Signed)
?[facility] ? ? ? ?CSN: [id number] ?Arrival date & time: 04/09/21  1649 ? ? ?  ? ?History   ?Chief Complaint ?Chief Complaint  ?Patient presents with  ? Cough  ?  Covid test, cough - Entered by patient  ? Covid Exposure  ? ? ?HPI ?Alexandra Warren is a 41 y.o. female.  Patient presents with body aches, congestion, cough since yesterday.  She was exposed to someone in her household with COVID 2 days ago.  She denies fever, chills, sore throat, shortness of breath, vomiting, diarrhea, or other symptoms.  Treatment at home with ibuprofen.  Patient has breast cancer and is undergoing radiation treatment.  She contacted her oncologist who instructed her to come here for COVID testing. ? ?The history is provided by the patient and medical records.  ? ?Past Medical History:  ?Diagnosis Date  ? Acid reflux   ? Anxiety and depression   ? Dr. Gala Murdoch, MD  ? Back pain   ? Family history of breast cancer 09/21/2020  ? Family history of malignant neoplasm of digestive organ 09/21/2020  ? Fibromyalgia   ? History of gallstones   ? Malignant neoplasm of upper-inner quadrant of right female breast ([place]) 09/21/2020  ? Migraine   ? ? ?Patient Active Problem List  ? Diagnosis Date Noted  ? Genetic testing 10/02/2020  ? Carcinoma of upper-inner quadrant of right breast in female, estrogen receptor positive ([place]) 09/21/2020  ? Family history of breast cancer 09/21/2020  ? Family history of malignant neoplasm of digestive organ 09/21/2020  ? Insomnia 08/02/2013  ? Migraine without aura 07/27/2012  ? Generalized anxiety disorder 07/27/2012  ? Depression 07/27/2012  ? ? ?Past Surgical History:  ?Procedure Laterality Date  ? BREAST LUMPECTOMY WITH RADIOACTIVE SEED LOCALIZATION Right 10/17/2020  ? Procedure: RIGHT BREAST LUMPECTOMY WITH RADIOACTIVE SEED LOCALIZATION WITH RIGHT SENTINEL LYMPH NODE BIOPSY;  Surgeon: Stark Klein, MD;  Location: [place];  Service: General;  Laterality: Right;  ? CHOLECYSTECTOMY  12/2004  ? PORTACATH  PLACEMENT Left 10/17/2020  ? Procedure: INSERTION PORT-A-CATH;  Surgeon: Stark Klein, MD;  Location: [place];  Service: General;  Laterality: Left;  ? ? ?OB History   ?No obstetric history on file. ?  ? ? ? ?Home Medications   ? ?Prior to Admission medications   ?Medication Sig Start Date End Date Taking? Authorizing Provider  ?almotriptan (AXERT) 12.5 MG tablet Take 1 tab at onset of migraine.  May repeat in 2 hrs, if needed.  Max dose: 2 tabs/day. This is a 30 day prescription. 11/14/20   Marcial Pacas, MD  ?azelastine (ASTELIN) 0.1 % nasal spray Place 1 spray into both nostrils daily as needed for rhinitis. 07/30/18   [provider]  ?BOTOX 200 units SOLR Inject 200 Units into the skin See admin instructions. Every 90 days 05/23/20   [provider]  ?butalbital-acetaminophen-caffeine (FIORICET) 50-325-40 MG tablet Take 1 tablet by mouth every 8 (eight) hours as needed for headache. 11/14/20   Marcial Pacas, MD  ?celecoxib (CELEBREX) 200 MG capsule Take 200 mg by mouth daily. 03/19/21   [provider]  ?clindamycin (CLEOCIN T) 1 % external solution Apply topically 2 (two) times daily as needed. 01/16/21   [provider]  ?clonazePAM (KLONOPIN) 1 MG tablet Take 0.5-1 mg by mouth 2 (two) times daily as needed for anxiety.    [provider]  ?diazepam (VALIUM) 5 MG tablet One pill 45-60 mins prior to procedure; 1 pill 15 mins  prior if needed. 10/03/20   Cammie Sickle, MD  ?EMGALITY 120 MG/ML SOAJ Inject 120 mg into the skin every 30 (thirty) days. 02/20/21   Marcial Pacas, MD  ?fluconazole (DIFLUCAN) 100 MG tablet TAKE 1 TABLET(100 MG) BY MOUTH DAILY AS NEEDED FOR YEAST INFECTION 01/22/21   Cammie Sickle, MD  ?fluconazole (DIFLUCAN) 100 MG tablet Take 1 tablet (100 mg total) by mouth daily. 03/14/21   Noreene Filbert, MD  ?lidocaine-prilocaine (EMLA) cream Apply 1 application topically as needed. 12/21/20   Cammie Sickle, MD  ?omeprazole (PRILOSEC) 20 MG capsule  Take 20 mg by mouth daily.    [provider]  ?ondansetron (ZOFRAN) 8 MG tablet Take 1 tablet (8 mg total) by mouth every 8 (eight) hours as needed for nausea or vomiting. 11/09/20   Cammie Sickle, MD  ?potassium chloride SA (KLOR-CON M) 20 MEQ tablet 1 pill twice a day 02/08/21   Cammie Sickle, MD  ?predniSONE (DELTASONE) 10 MG tablet Take 1 tablet (10 mg total) by mouth daily with breakfast. 01/04/21   Jacquelin Hawking, NP  ?prochlorperazine (COMPAZINE) 10 MG tablet Take 1 tablet (10 mg total) by mouth every 6 (six) hours as needed for nausea or vomiting. 12/28/20   Cammie Sickle, MD  ?rosuvastatin (CRESTOR) 20 MG tablet Take 20 mg by mouth daily.    [provider]  ?tiZANidine (ZANAFLEX) 4 MG tablet Take 1 tablet (4 mg total) by mouth every 8 (eight) hours as needed for muscle spasms (migraines). This is a 30 day rx. 09/25/20   Marcial Pacas, MD  ?topiramate (TOPAMAX) 100 MG tablet Take 1 tablet (100 mg total) by mouth 2 (two) times daily. ?Patient taking differently: Take 200 mg by mouth at bedtime. 05/01/20   Suzzanne Cloud, NP  ?traMADol (ULTRAM) 50 MG tablet Take 50 mg by mouth every 6 (six) hours as needed for pain.    [provider]  ?vortioxetine HBr (TRINTELLIX) 20 MG TABS tablet Take 20 mg by mouth daily. 10/02/16   [provider]  ?zolpidem (AMBIEN) 10 MG tablet Take 10 mg by mouth at bedtime as needed for sleep. 02/07/20   [provider]  ? ? ?Family History ?Family History  ?Problem Relation Age of Onset  ? Healthy Mother   ? Hypertension Father   ? Skin cancer Brother   ?     dx before 40  ? Colon cancer Maternal Aunt 70  ? Cervical cancer Maternal Aunt   ?     dx before 28; x2  ? Cancer Paternal Grandmother 37  ?     unknown primary; ? liver  ? Lung cancer Paternal Grandfather   ?     dx after 68  ? Breast cancer Cousin 83  ?     maternal cousin  ? Colon cancer Other   ?     MGM's sister  ? Gastric cancer Other   ?     PGM's sister  ?  Leukemia Other   ?     PMG's sister  ? Rectal cancer Neg Hx   ? Esophageal cancer Neg Hx   ? ? ?Social History ?Social History  ? ?Tobacco Use  ? Smoking status: Never  ? Smokeless tobacco: Never  ?Vaping Use  ? Vaping Use: Never used  ?Substance Use Topics  ? Alcohol use: Not Currently  ?  Comment: pt states 1 glass of wine maybe once a month  ? Drug use: No  ? ? ? ?  Allergies   ?Antihistamines, diphenhydramine-type; Diphenhydramine hcl; Promethazine; and Pseudoeph-doxylamine-dm-apap ? ? ?Review of Systems ?Review of Systems  ?Constitutional:  Negative for chills and fever.  ?HENT:  Positive for congestion. Negative for ear pain and sore throat.   ?Respiratory:  Positive for cough. Negative for shortness of breath.   ?Cardiovascular:  Negative for chest pain and palpitations.  ?Gastrointestinal:  Negative for diarrhea and vomiting.  ?Skin:  Negative for color change and rash.  ?All other systems reviewed and are negative. ? ? ?Physical Exam ?Triage Vital Signs ?ED Triage Vitals  ?Enc Vitals Group  ?   BP 04/09/21 1656 120/86  ?   Pulse Rate 04/09/21 1656 92  ?   Resp 04/09/21 1656 18  ?   Temp 04/09/21 1656 98.2 ?F (36.8 ?C)  ?   Temp src --   ?   SpO2 04/09/21 1656 98 %  ?   Weight --   ?   Height --   ?   Head Circumference --   ?   Peak Flow --   ?   Pain Score 04/09/21 1701 0  ?   Pain Loc --   ?   Pain Edu? --   ?   Excl. in [place]? --   ? ?No data found. ? ?Updated Vital Signs ?BP 120/86   Pulse 92   Temp 98.2 ?F (36.8 ?C)   Resp 18   SpO2 98%  ? ?Visual Acuity ?Right Eye Distance:   ?Left Eye Distance:   ?Bilateral Distance:   ? ?Right Eye Near:   ?Left Eye Near:    ?Bilateral Near:    ? ?Physical Exam ?Vitals and nursing note reviewed.  ?Constitutional:   ?   General: She is not in acute distress. ?   Appearance: She is well-developed. She is not ill-appearing.  ?HENT:  ?   Right Ear: Tympanic membrane normal.  ?   Left Ear: Tympanic membrane normal.  ?   Nose: Nose normal.  ?   Mouth/Throat:  ?   Mouth:  Mucous membranes are moist.  ?   Pharynx: Oropharynx is clear.  ?Cardiovascular:  ?   Rate and Rhythm: Normal rate and regular rhythm.  ?   Heart sounds: Normal heart sounds.  ?Pulmonary:  ?   Effort: Pulmonary ef

## 2021-04-10 ENCOUNTER — Ambulatory Visit: Payer: PRIVATE HEALTH INSURANCE

## 2021-04-10 ENCOUNTER — Inpatient Hospital Stay (HOSPITAL_BASED_OUTPATIENT_CLINIC_OR_DEPARTMENT_OTHER): Payer: PRIVATE HEALTH INSURANCE | Admitting: Medical Oncology

## 2021-04-10 DIAGNOSIS — R051 Acute cough: Secondary | ICD-10-CM | POA: Diagnosis not present

## 2021-04-10 DIAGNOSIS — Z9189 Other specified personal risk factors, not elsewhere classified: Secondary | ICD-10-CM | POA: Diagnosis not present

## 2021-04-10 DIAGNOSIS — Z9221 Personal history of antineoplastic chemotherapy: Secondary | ICD-10-CM

## 2021-04-10 DIAGNOSIS — Z5112 Encounter for antineoplastic immunotherapy: Secondary | ICD-10-CM | POA: Diagnosis not present

## 2021-04-10 DIAGNOSIS — Z20822 Contact with and (suspected) exposure to covid-19: Secondary | ICD-10-CM | POA: Diagnosis not present

## 2021-04-10 MED ORDER — MOLNUPIRAVIR EUA 200MG CAPSULE
4.0000 | ORAL_CAPSULE | Freq: Two times a day (BID) | ORAL | 0 refills | Status: AC
Start: 2021-04-10 — End: 2021-04-15

## 2021-04-10 MED ORDER — HYDROCOD POLI-CHLORPHE POLI ER 10-8 MG/5ML PO SUER: 5.0000 mL | Freq: Two times a day (BID) | ORAL | 0 refills | Status: AC | PRN

## 2021-04-10 MED ORDER — BENZONATATE 200 MG PO CAPS: 200.0000 mg | ORAL_CAPSULE | Freq: Three times a day (TID) | ORAL | 0 refills | Status: DC | PRN

## 2021-04-10 MED ORDER — HYDROCOD POLI-CHLORPHE POLI ER 10-8 MG/5ML PO SUER: 5.0000 mL | Freq: Two times a day (BID) | ORAL | 0 refills | Status: DC | PRN

## 2021-04-10 NOTE — Progress Notes (Signed)
Virtual Visit Progress Note  Alexandra Warren,you are scheduled for a virtual visit with your provider today.    Just as we do with appointments in the office, we must obtain your consent to participate.  Your consent will be active for this visit and any virtual visit you may have with one of our providers in the next 365 days.    If you have a MyChart account, I can also send a copy of this consent to you electronically.  All virtual visits are billed to your insurance company just like a traditional visit in the office.  As this is a virtual visit, video technology does not allow for your provider to perform a traditional examination.  This may limit your provider's ability to fully assess your condition.  If your provider identifies any concerns that need to be evaluated in person or the need to arrange testing such as labs, EKG, etc, we will make arrangements to do so.    Although advances in technology are sophisticated, we cannot ensure that it will always work on either your end or our end.  If the connection with a video visit is poor, we may have to switch to a telephone visit.  With either a video or telephone visit, we are not always able to ensure that we have a secure connection.   I need to obtain your verbal consent now.   Are you willing to proceed with your visit today?   Alexandra Warren has provided verbal consent on 04/10/2021 for a virtual visit (video or telephone).   Rushie Chestnut, PA-C 04/10/2021  1:34 PM    I connected with Alexandra Warren on 04/10/21 at  1:00 PM EDT by video enabled telemedicine visit  and telephone visit hybrid due to connection issues and verified that I am speaking with the correct person using two identifiers.   I discussed the limitations, risks, security and privacy concerns of performing an evaluation and management service by telemedicine and the availability of in-person appointments. I also discussed with the patient that there may be a patient  responsible charge related to this service. The patient expressed understanding and agreed to proceed.   Patients location: Home Providers location: Clinic   Chief Complaint: Cough    Patient Care Team: Aliene Beams, MD as PCP - General (Family Medicine)   Name of the patient: Alexandra Warren  [id number]  Jan 05, 1981   Date of visit: 04/10/21  History of Presenting Illness-   Cough: Patient reports that cough started on Monday. Cough is dry in nature. No fevers, no SOB, no chest pain. She also reports some nausea without vomiting, has some nasal congestion and sore throat. She had tried Delsym which is not helping. Her allergies to medications complicate OTC usage of medications. Household member with whom she shares a bed also currently sick with COVID-19. Had PCR at ER yesterday which has not yet resulted. She is on chemotherapy with recent infusion. Of note she reports that she only takes her benzodiazepines PRN without need currently.   Review of systems- Review of Systems  Constitutional:  Negative for chills and fever.  Respiratory:  Negative for hemoptysis, sputum production, shortness of breath and wheezing.   Cardiovascular:  Negative for chest pain.  Gastrointestinal:  Negative for diarrhea and vomiting.    Allergies  Allergen Reactions   Antihistamines, Diphenhydramine-Type Other (See Comments)   Diphenhydramine Hcl Other (See Comments)    Restless leg   Promethazine Other (See Comments)  Tingling sensation   Pseudoeph-Doxylamine-Dm-Apap Other (See Comments)    Nyquil- Tingling sensation in legs    Past Medical History:  Diagnosis Date   Acid reflux    Anxiety and depression    Dr. 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   Migraine      Past Surgical History:  Procedure Laterality Date   BREAST LUMPECTOMY WITH RADIOACTIVE SEED LOCALIZATION Right 10/17/2020   Procedure: RIGHT BREAST LUMPECTOMY WITH RADIOACTIVE SEED LOCALIZATION WITH RIGHT SENTINEL LYMPH NODE BIOPSY;  Surgeon: Almond Lint, MD;  Location: [facility];  Service: General;  Laterality: Right;   CHOLECYSTECTOMY  12/2004   PORTACATH PLACEMENT Left 10/17/2020   Procedure: INSERTION PORT-A-CATH;  Surgeon: Almond Lint, MD;  Location: [facility] OR;  Service: General;  Laterality: Left;    Social History   Socioeconomic History   Marital status: Divorced    Spouse name: Ambulance person   Number of children: 0   Years of education: MS   Highest education level: Not on file  Occupational History   Occupation: Airline pilot  Tobacco Use   Smoking status: Never   Smokeless tobacco: Never  Vaping Use   Vaping Use: Never used  Substance and Sexual Activity   Alcohol use: Not Currently    Comment: pt states 1 glass of wine maybe once a month   Drug use: No   Sexual activity: Yes    Birth control/protection: Condom  Other Topics Concern   Not on file  Social History Narrative   Accountant for commercial firm; wants short term disability;  never smoked; 2 drinks a month. Lives in [place] with boy friend [[place]- [place] No children. Has one brother    Social Determinants of Corporate investment banker Strain: Not on file  Food Insecurity: Not on file  Transportation Needs: Not on file  Physical Activity: Not on file  Stress: Not on file  Social Connections: Not on file  Intimate Partner Violence: Not on file    Immunization History  Administered Date(s) Administered   PFIZER(Purple Top)SARS-COV-2 Vaccination 04/23/2019, 05/18/2019    Family History  Problem Relation Age of Onset   Healthy Mother    Hypertension Father    Skin cancer Brother        dx before 42   Colon cancer Maternal Aunt 50   Cervical cancer Maternal Aunt        dx before 76; x2    Cancer Paternal Grandmother 65       unknown primary; ? liver   Lung cancer Paternal Grandfather        dx after 28   Breast cancer Cousin 83       maternal cousin   Colon cancer Other        MGM's sister   Gastric cancer Other        PGM's sister   Leukemia Other        PMG's sister   Rectal cancer Neg Hx    Esophageal cancer Neg Hx      Current Outpatient Medications:    benzonatate (TESSALON) 200 MG capsule, Take 1 capsule (200 mg total) by mouth 3 (three) times daily as needed for cough., Disp: 20 capsule, Rfl: 0   molnupiravir  EUA (LAGEVRIO) 200 mg CAPS capsule, Take 4 capsules (800 mg total) by mouth 2 (two) times daily for 5 days., Disp: 40 capsule, Rfl: 0   almotriptan (AXERT) 12.5 MG tablet, Take 1 tab at onset of migraine.  May repeat in 2 hrs, if needed.  Max dose: 2 tabs/day. This is a 30 day prescription., Disp: 12 tablet, Rfl: 11   azelastine (ASTELIN) 0.1 % nasal spray, Place 1 spray into both nostrils daily as needed for rhinitis., Disp: , Rfl:    BOTOX 200 units SOLR, Inject 200 Units into the skin See admin instructions. Every 90 days, Disp: , Rfl:    butalbital-acetaminophen-caffeine (FIORICET) 50-325-40 MG tablet, Take 1 tablet by mouth every 8 (eight) hours as needed for headache., Disp: 10 tablet, Rfl: 5   celecoxib (CELEBREX) 200 MG capsule, Take 200 mg by mouth daily., Disp: , Rfl:    chlorpheniramine-HYDROcodone 10-8 MG/5ML, Take 5 mLs by mouth every 12 (twelve) hours as needed for up to 5 days for cough., Disp: 50 mL, Rfl: 0   clindamycin (CLEOCIN T) 1 % external solution, Apply topically 2 (two) times daily as needed., Disp: , Rfl:    clonazePAM (KLONOPIN) 1 MG tablet, Take 0.5-1 mg by mouth 2 (two) times daily as needed for anxiety., Disp: , Rfl:    diazepam (VALIUM) 5 MG tablet, One pill 45-60 mins prior to procedure; 1 pill 15 mins prior if needed., Disp: 6 tablet, Rfl: 0   EMGALITY 120 MG/ML SOAJ, Inject 120 mg into the skin every 30 (thirty) days., Disp: 3  mL, Rfl: 4   fluconazole (DIFLUCAN) 100 MG tablet, TAKE 1 TABLET(100 MG) BY MOUTH DAILY AS NEEDED FOR YEAST INFECTION, Disp: 6 tablet, Rfl: 0   fluconazole (DIFLUCAN) 100 MG tablet, Take 1 tablet (100 mg total) by mouth daily., Disp: 10 tablet, Rfl: 0   lidocaine-prilocaine (EMLA) cream, Apply 1 application topically as needed., Disp: 30 g, Rfl: 4   omeprazole (PRILOSEC) 20 MG capsule, Take 20 mg by mouth daily., Disp: , Rfl:    ondansetron (ZOFRAN) 8 MG tablet, Take 1 tablet (8 mg total) by mouth every 8 (eight) hours as needed for nausea or vomiting., Disp: 30 tablet, Rfl: 3   potassium chloride SA (KLOR-CON M) 20 MEQ tablet, 1 pill twice a day, Disp: 30 tablet, Rfl: 3   predniSONE (DELTASONE) 10 MG tablet, Take 1 tablet (10 mg total) by mouth daily with breakfast., Disp: 30 tablet, Rfl: 0   prochlorperazine (COMPAZINE) 10 MG tablet, Take 1 tablet (10 mg total) by mouth every 6 (six) hours as needed for nausea or vomiting., Disp: 40 tablet, Rfl: 1   rosuvastatin (CRESTOR) 20 MG tablet, Take 20 mg by mouth daily., Disp: , Rfl:    tiZANidine (ZANAFLEX) 4 MG tablet, Take 1 tablet (4 mg total) by mouth every 8 (eight) hours as needed for muscle spasms (migraines). This is a 30 day rx., Disp: 20 tablet, Rfl: 5   topiramate (TOPAMAX) 100 MG tablet, Take 1 tablet (100 mg total) by mouth 2 (two) times daily. (Patient taking differently: Take 200 mg by mouth at bedtime.), Disp: 60 tablet, Rfl: 11   traMADol (ULTRAM) 50 MG tablet, Take 50 mg by mouth every 6 (six) hours as needed for pain., Disp: , Rfl:    vortioxetine HBr (TRINTELLIX) 20 MG TABS tablet, Take 20 mg by mouth daily., Disp: , Rfl: 0   zolpidem (AMBIEN) 10 MG tablet, Take 10 mg by mouth at bedtime as needed for  sleep., Disp: , Rfl:   Physical exam: Exam limited due to telemedicine Physical Exam Vitals and nursing note reviewed.  Constitutional:      General: She is not in acute distress.    Appearance: Normal appearance. She is not  ill-appearing, toxic-appearing or diaphoretic.     Comments: Dry cough  Skin:    General: Skin is warm.     Comments: NO cyanosis  Neurological:     Mental Status: She is alert and oriented to person, place, and time.      Assessment and plan- Patient is a 41 y.o. female presents with a cough and high risk exposure to COVID-19. Given her significant increased risk of complication from COVID-19 the discussion was had regarding antiviral medications including options, risks and benefits including rebound. Discussed with Chief Dr. Orlie Dakin who is agreeable with patient's desire for starting antiviral therapy. Also sending in tessalon and Tussionex (given her allergy to promethazine, dex-Promethazine was not an option). Discussed safety of use and to not take with her clonazepam. Rest, hydration and monitoring for worsening on condition encouraged. She will monitor with a pulse oximeter and go to the ER as needed.    Visit Diagnosis 1. Acute cough   2. Close exposure to COVID-19 virus   3. At high risk for infection due to chemotherapy     Patient expressed understanding and was in agreement with this plan. She also understands that She can call clinic at any time with any questions, concerns, or complaints.   I discussed the assessment and treatment plan with the patient. The patient was provided an opportunity to ask questions and all were answered. The patient agreed with the plan and demonstrated an understanding of the instructions.   The patient was advised to call back or seek an in-person evaluation if the symptoms worsen or if the condition fails to improve as anticipated.    I spent 15 minutes on this telephone encounter.  Video connection was lost at >50% of the duration of the visit, at which time the remainder of the visit was completed via audio only.  Thank you for allowing me to participate in the care of this very pleasant patient.   Clent Jacks PA-C [place]  Virtual Visits On Dem[facility]

## 2021-04-11 ENCOUNTER — Ambulatory Visit: Payer: PRIVATE HEALTH INSURANCE

## 2021-04-11 LAB — NOVEL CORONAVIRUS, NAA: SARS-CoV-2, NAA: NOT DETECTED

## 2021-04-12 ENCOUNTER — Ambulatory Visit: Payer: PRIVATE HEALTH INSURANCE

## 2021-04-15 ENCOUNTER — Ambulatory Visit
Admission: RE | Admit: 2021-04-15 | Discharge: 2021-04-15 | Disposition: A | Discharge status: Home / Self Care | Payer: PRIVATE HEALTH INSURANCE | Source: Ambulatory Visit | Attending: Radiation Oncology | Admitting: Radiation Oncology

## 2021-04-15 DIAGNOSIS — Z5112 Encounter for antineoplastic immunotherapy: Secondary | ICD-10-CM | POA: Diagnosis not present

## 2021-04-16 ENCOUNTER — Ambulatory Visit
Admission: RE | Admit: 2021-04-16 | Discharge: 2021-04-16 | Disposition: A | Discharge status: Home / Self Care | Payer: PRIVATE HEALTH INSURANCE | Source: Ambulatory Visit | Attending: Radiation Oncology | Admitting: Radiation Oncology

## 2021-04-16 DIAGNOSIS — Z5112 Encounter for antineoplastic immunotherapy: Secondary | ICD-10-CM | POA: Diagnosis not present

## 2021-04-17 ENCOUNTER — Ambulatory Visit: Payer: PRIVATE HEALTH INSURANCE

## 2021-04-17 ENCOUNTER — Ambulatory Visit
Admission: RE | Admit: 2021-04-17 | Discharge: 2021-04-17 | Disposition: A | Discharge status: Home / Self Care | Payer: PRIVATE HEALTH INSURANCE | Source: Ambulatory Visit | Attending: Radiation Oncology | Admitting: Radiation Oncology

## 2021-04-17 DIAGNOSIS — Z5112 Encounter for antineoplastic immunotherapy: Secondary | ICD-10-CM | POA: Diagnosis not present

## 2021-04-18 ENCOUNTER — Ambulatory Visit
Admission: RE | Admit: 2021-04-18 | Discharge: 2021-04-18 | Disposition: A | Discharge status: Home / Self Care | Payer: PRIVATE HEALTH INSURANCE | Source: Ambulatory Visit | Attending: Radiation Oncology | Admitting: Radiation Oncology

## 2021-04-18 ENCOUNTER — Ambulatory Visit: Payer: PRIVATE HEALTH INSURANCE

## 2021-04-18 ENCOUNTER — Telehealth: Payer: Self-pay | Admitting: *Deleted

## 2021-04-18 DIAGNOSIS — Z5112 Encounter for antineoplastic immunotherapy: Secondary | ICD-10-CM | POA: Diagnosis not present

## 2021-04-18 NOTE — Telephone Encounter (Signed)
Lauren, NP signed form on behalf of Dr. B. Forms faxed at 1600-04/18/21 ?

## 2021-04-18 NOTE — Telephone Encounter (Signed)
Received incoming form for disability claim from [place] at 2pm today. ? ? ?Attempted to reach patient via telephone no answer. Detailed vm to call our office back to clarify my questions re: her disability claim. I did attempt to reach out to patient while she was in rad onc dept, but she had already left today by 240 per rad onc rn. ?

## 2021-04-19 ENCOUNTER — Ambulatory Visit
Admission: RE | Admit: 2021-04-19 | Discharge: 2021-04-19 | Disposition: A | Discharge status: Home / Self Care | Payer: PRIVATE HEALTH INSURANCE | Source: Ambulatory Visit | Attending: Radiation Oncology | Admitting: Radiation Oncology

## 2021-04-19 ENCOUNTER — Ambulatory Visit: Payer: PRIVATE HEALTH INSURANCE

## 2021-04-19 ENCOUNTER — Encounter: Payer: Self-pay | Admitting: Internal Medicine

## 2021-04-19 DIAGNOSIS — Z5112 Encounter for antineoplastic immunotherapy: Secondary | ICD-10-CM | POA: Diagnosis not present

## 2021-04-22 ENCOUNTER — Ambulatory Visit: Payer: PRIVATE HEALTH INSURANCE

## 2021-04-22 ENCOUNTER — Ambulatory Visit
Admission: RE | Admit: 2021-04-22 | Discharge: 2021-04-22 | Disposition: A | Discharge status: Home / Self Care | Payer: PRIVATE HEALTH INSURANCE | Source: Ambulatory Visit | Attending: Radiation Oncology | Admitting: Radiation Oncology

## 2021-04-22 DIAGNOSIS — Z79899 Other long term (current) drug therapy: Secondary | ICD-10-CM | POA: Diagnosis not present

## 2021-04-22 DIAGNOSIS — C50211 Malignant neoplasm of upper-inner quadrant of right female breast: Secondary | ICD-10-CM | POA: Insufficient documentation

## 2021-04-22 DIAGNOSIS — Z5112 Encounter for antineoplastic immunotherapy: Secondary | ICD-10-CM | POA: Diagnosis present

## 2021-04-23 ENCOUNTER — Ambulatory Visit: Payer: PRIVATE HEALTH INSURANCE

## 2021-04-23 ENCOUNTER — Ambulatory Visit
Admission: RE | Admit: 2021-04-23 | Discharge: 2021-04-23 | Disposition: A | Discharge status: Home / Self Care | Payer: PRIVATE HEALTH INSURANCE | Source: Ambulatory Visit | Attending: Radiation Oncology | Admitting: Radiation Oncology

## 2021-04-23 DIAGNOSIS — Z5112 Encounter for antineoplastic immunotherapy: Secondary | ICD-10-CM | POA: Diagnosis not present

## 2021-04-24 ENCOUNTER — Ambulatory Visit: Payer: PRIVATE HEALTH INSURANCE

## 2021-04-24 ENCOUNTER — Ambulatory Visit
Admission: RE | Admit: 2021-04-24 | Discharge: 2021-04-24 | Disposition: A | Discharge status: Home / Self Care | Payer: PRIVATE HEALTH INSURANCE | Source: Ambulatory Visit | Attending: Radiation Oncology | Admitting: Radiation Oncology

## 2021-04-24 DIAGNOSIS — Z5112 Encounter for antineoplastic immunotherapy: Secondary | ICD-10-CM | POA: Diagnosis not present

## 2021-04-25 ENCOUNTER — Ambulatory Visit
Admission: RE | Admit: 2021-04-25 | Discharge: 2021-04-25 | Disposition: A | Discharge status: Home / Self Care | Payer: PRIVATE HEALTH INSURANCE | Source: Ambulatory Visit | Attending: Radiation Oncology | Admitting: Radiation Oncology

## 2021-04-25 DIAGNOSIS — Z5112 Encounter for antineoplastic immunotherapy: Secondary | ICD-10-CM | POA: Diagnosis not present

## 2021-04-26 ENCOUNTER — Ambulatory Visit
Admission: RE | Admit: 2021-04-26 | Discharge: 2021-04-26 | Disposition: A | Discharge status: Home / Self Care | Payer: PRIVATE HEALTH INSURANCE | Source: Ambulatory Visit | Attending: Radiation Oncology | Admitting: Radiation Oncology

## 2021-04-26 ENCOUNTER — Inpatient Hospital Stay: Payer: PRIVATE HEALTH INSURANCE | Attending: Internal Medicine

## 2021-04-26 ENCOUNTER — Inpatient Hospital Stay (HOSPITAL_BASED_OUTPATIENT_CLINIC_OR_DEPARTMENT_OTHER): Payer: PRIVATE HEALTH INSURANCE | Admitting: Nurse Practitioner

## 2021-04-26 ENCOUNTER — Inpatient Hospital Stay: Payer: PRIVATE HEALTH INSURANCE

## 2021-04-26 ENCOUNTER — Encounter: Payer: Self-pay | Admitting: Nurse Practitioner

## 2021-04-26 ENCOUNTER — Ambulatory Visit: Payer: PRIVATE HEALTH INSURANCE

## 2021-04-26 VITALS — BP 106/88 | HR 95 | Temp 97.6°F | Ht 66.0 in | Wt 179.0 lb

## 2021-04-26 DIAGNOSIS — Z170 Estrogen receptor positive status [ER+]: Secondary | ICD-10-CM | POA: Diagnosis not present

## 2021-04-26 DIAGNOSIS — C50211 Malignant neoplasm of upper-inner quadrant of right female breast: Secondary | ICD-10-CM | POA: Diagnosis not present

## 2021-04-26 DIAGNOSIS — Z5112 Encounter for antineoplastic immunotherapy: Secondary | ICD-10-CM | POA: Diagnosis not present

## 2021-04-26 DIAGNOSIS — Z5111 Encounter for antineoplastic chemotherapy: Secondary | ICD-10-CM

## 2021-04-26 DIAGNOSIS — Z79899 Other long term (current) drug therapy: Secondary | ICD-10-CM | POA: Insufficient documentation

## 2021-04-26 LAB — CBC WITH DIFFERENTIAL/PLATELET
Abs Immature Granulocytes: 0.03 10*3/uL (ref 0.00–0.07)
Basophils Absolute: 0.1 10*3/uL (ref 0.0–0.1)
Basophils Relative: 1 %
Eosinophils Absolute: 0.2 10*3/uL (ref 0.0–0.5)
Eosinophils Relative: 3 %
HCT: 40.4 % (ref 36.0–46.0)
Hemoglobin: 13.7 g/dL (ref 12.0–15.0)
Immature Granulocytes: 0 %
Lymphocytes Relative: 26 %
Lymphs Abs: 1.8 10*3/uL (ref 0.7–4.0)
MCH: 32.2 pg (ref 26.0–34.0)
MCHC: 33.9 g/dL (ref 30.0–36.0)
MCV: 94.8 fL (ref 80.0–100.0)
Monocytes Absolute: 0.6 10*3/uL (ref 0.1–1.0)
Monocytes Relative: 8 %
Neutro Abs: 4.4 10*3/uL (ref 1.7–7.7)
Neutrophils Relative %: 62 %
Platelets: 293 10*3/uL (ref 150–400)
RBC: 4.26 MIL/uL (ref 3.87–5.11)
RDW: 12.3 % (ref 11.5–15.5)
WBC: 7.1 10*3/uL (ref 4.0–10.5)
nRBC: 0 % (ref 0.0–0.2)

## 2021-04-26 LAB — COMPREHENSIVE METABOLIC PANEL
ALT: 28 U/L (ref 0–44)
AST: 24 U/L (ref 15–41)
Albumin: 4.1 g/dL (ref 3.5–5.0)
Alkaline Phosphatase: 56 U/L (ref 38–126)
Anion gap: 8 (ref 5–15)
BUN: 17 mg/dL (ref 6–20)
CO2: 21 mmol/L — ABNORMAL LOW (ref 22–32)
Calcium: 8.6 mg/dL — ABNORMAL LOW (ref 8.9–10.3)
Chloride: 105 mmol/L (ref 98–111)
Creatinine, Ser: 0.7 mg/dL (ref 0.44–1.00)
GFR, Estimated: >60 mL/min (ref >60)
Glucose, Bld: 106 mg/dL — ABNORMAL HIGH (ref 70–99)
Potassium: 3.7 mmol/L (ref 3.5–5.1)
Sodium: 134 mmol/L — ABNORMAL LOW (ref 135–145)
Total Bilirubin: 0.4 mg/dL (ref 0.3–1.2)
Total Protein: 6.8 g/dL (ref 6.5–8.1)

## 2021-04-26 LAB — PREGNANCY, URINE: Preg Test, Ur: NEGATIVE

## 2021-04-26 MED ORDER — ACETAMINOPHEN 325 MG PO TABS
650.0000 mg | ORAL_TABLET | Freq: Once | ORAL | Status: AC
  Administered 2021-04-26: 650 mg via ORAL
  Filled 2021-04-26: qty 2

## 2021-04-26 MED ORDER — TRASTUZUMAB-DKST CHEMO 150 MG IV SOLR
6.0000 mg/kg | Freq: Once | INTRAVENOUS | Status: AC
  Administered 2021-04-26: 483 mg via INTRAVENOUS
  Filled 2021-04-26: qty 23

## 2021-04-26 MED ORDER — DIPHENHYDRAMINE HCL 25 MG PO CAPS
50.0000 mg | ORAL_CAPSULE | Freq: Once | ORAL | Status: AC
  Administered 2021-04-26: 50 mg via ORAL
  Filled 2021-04-26: qty 2

## 2021-04-26 MED ORDER — SODIUM CHLORIDE 0.9 % IV SOLN
Freq: Once | INTRAVENOUS | Status: AC
  Filled 2021-04-26: qty 250

## 2021-04-26 MED ORDER — HEPARIN SOD (PORK) LOCK FLUSH 100 UNIT/ML IV SOLN
INTRAVENOUS | Status: AC
  Administered 2021-04-26: 500 [IU]
  Filled 2021-04-26: qty 5

## 2021-04-26 NOTE — Progress Notes (Signed)
Patient tolerated trastuzumab-dkst infusion well today, no concerns voiced. Patient stable at discharge. Refused AVS.  ?

## 2021-04-26 NOTE — Progress Notes (Signed)
Pt tearful. States he boyfriend left her this past week. Having a lot of life changes. Is seeing a therapist and psychiatrist, rx'd lithium and lunesta but hasn't started yet. ? ?C/o rt. Arm pit pain at incision site. Would like it looked at. ?

## 2021-04-26 NOTE — Progress Notes (Signed)
[facility] ?CONSULT NOTE ? ?Patient Care Team: ?[name], MD as PCP - General (Family Medicine) ? ?CHIEF COMPLAINTS/PURPOSE OF CONSULTATION: Breast cancer ? ?#  ?Oncology History Overview Note  ? #Right breast-invasive mammary carcinoma 2 o'clock position #1.1 cm-ER/PR HER2/neu positive [3+]; SEP, 2022-MRI-up to 3 cm non-mass enhancement noted. ? ?#Left breast- MRI #upper outer quadrant- 65m #retroareolar 1cm-. NEGATIVE for malignancy ? ? EProcedure: Lumpectomy  ?Specimen Laterality: Right  ?Histologic Type: Invasive ductal carcinoma  ?Histologic Grade:  ?     Glandular (Acinar)/Tubular Differentiation: 3  ?     Nuclear Pleomorphism: 2  ?     Mitotic Rate: 1  ?     Overall Grade: 2  ?Tumor Size: 0.9 cm  ?Ductal Carcinoma In Situ: Present, intermediate grade  ?Treatment Effect in the Breast: No known presurgical therapy  ?Margins: All margins negative for invasive carcinoma  ?     Distance from Closest Margin (mm): Cannot be determined  ?DCIS Margins: Uninvolved by DCIS  ?     Distance from Closest Margin (mm): Cannot be determined  ?Regional Lymph Nodes:  ?     Number of Lymph Nodes Examined: 5  ?     Number of Sentinel Nodes Examined: 5  ?     Number of Lymph Nodes with Macrometastases (>2 mm): 0  ?     Number of Lymph Nodes with Micrometastases: 0  ?     Number of Lymph Nodes with Isolated Tumor Cells (=0.2 mm or =200  ?cells): 0  ?     Size of Largest Metastatic Deposit (mm): Not applicable  ?     Extranodal Extension: Not applicable  ?Distant Metastasis:  ?     Distant Site(s) Involved: Not applicable  ?Breast Biomarker Testing Performed on Previous Biopsy:  ?     Testing Performed on Case Number: [id number]  ?           Estrogen Receptor: 95%, positive, strong staining intensity  ?           Progesterone Receptor: 95%, positive, strong staining  ?intensity  ?           HER2: Positive (3+)  ?           Ki-67: 15%  ?Pathologic Stage Classification (pTNM, AJCC 8th Edition): pT1b, pN0   ?Representative Tumor Block: A4  ?Comment(s): None  ? ?# NOV 4th, 2022- TAXOL-[name] q W ? ?---------------------------------  ? ?# Headaches-Migraine/ Anxiety [[name]; Dr.Yan  botox] ? ?#Genetic testing-s/p genetic counseling-expanded panel negative. ? ?#Reproductive counseling: DECLINED reproductive endocrinology.   ?  ?Carcinoma of upper-inner quadrant of right breast in female, estrogen receptor positive ([facility]  ?09/21/2020 Initial Diagnosis  ? Malignant neoplasm of upper-inner quadrant of right female breast ([facility] ?  ?10/01/2020 Genetic Testing  ? Negative hereditary cancer genetic testing: no pathogenic variants detected in [name] and [name].  The report dates are October 01, 2020 and October 11, 2020, respectively.   ? ?The BRCAplus panel offered by [name] includes sequencing and deletion/duplication analysis for the following 8 genes: ATM, BRCA1, BRCA2, CDH1, CHEK2, PALB2, PTEN, and TP53.  The CustomNext-Cancer+RNAinsight panel offered by [employer] sequencing and rearrangement analysis for the following 47 genes:  APC, ATM, AXIN2, BARD1, BMPR1A, BRCA1, BRCA2, BRIP1, CDH1, CDK4, CDKN2A, CHEK2, DICER1, EPCAM, GREM1, HOXB13, MEN1, MLH1, MSH2, MSH3, MSH6, MUTYH, NBN, NF1, NF2, NTHL1, PALB2, PMS2, POLD1, POLE, PTEN, RAD51C, RAD51D, RECQL, RET, SDHA, SDHAF2,  SDHB, SDHC, SDHD, SMAD4, SMARCA4, STK11, TP53, TSC1, TSC2, and VHL.  RNA data is routinely analyzed for use in variant interpretation for all genes. ?  ?11/23/2020 -  Chemotherapy  ? Patient is on Treatment Plan : BREAST Paclitaxel + Trastuzumab q7d / Trastuzumab q21d  ?   ? ? ?HISTORY OF PRESENTING ILLNESS: Ambulating independently.  Alone. ? ?Alexandra Warren 41 y.o.  female patient with stage I ER/PR positive HER2 positive breast cancer currently on on adjuvant/maintenance [name] is here for follow-up and consideration of herceptin. She reports significant home stressors  lately including separation from boyfriend of 4 years, chronic illness. Symptomatically she feels at baseline but emotionally she is struggling. Saw her psychiatrist who adjusted medication but she hasn't started changes yet. Also sees counselor.  ? ?Review of Systems  ?Constitutional:  Positive for malaise/fatigue. Negative for chills, diaphoresis, fever and weight loss.  ?HENT:  Negative for nosebleeds and sore throat.   ?Eyes:  Negative for double vision.  ?Respiratory:  Negative for cough, hemoptysis, sputum production, shortness of breath and wheezing.   ?Cardiovascular:  Negative for chest pain, palpitations, orthopnea and leg swelling.  ?Gastrointestinal:  Negative for abdominal pain, blood in stool, constipation, diarrhea, heartburn, melena, nausea and vomiting.  ?Genitourinary:  Negative for dysuria, frequency and urgency.  ?Musculoskeletal:  Positive for myalgias. Negative for back pain and joint pain.  ?Skin:  Negative for itching and rash.  ?Neurological:  Negative for dizziness, tingling, focal weakness, weakness and headaches.  ?Endo/Heme/Allergies:  Does not bruise/bleed easily.  ?Psychiatric/Behavioral:  Positive for depression. The patient is nervous/anxious and has insomnia.   ?All other systems reviewed and are negative.  ? ?MEDICAL HISTORY:  ?Past Medical History:  ?Diagnosis Date  ? Acid reflux   ? Anxiety and depression   ? Dr. Gala Murdoch, MD  ? Back pain   ? Family history of breast cancer 09/21/2020  ? Family history of malignant neoplasm of digestive organ 09/21/2020  ? Fibromyalgia   ? History of gallstones   ? Malignant neoplasm of upper-inner quadrant of right female breast ([facility]) 09/21/2020  ? Migraine   ? ? ?SURGICAL HISTORY: ?Past Surgical History:  ?Procedure Laterality Date  ? BREAST LUMPECTOMY WITH RADIOACTIVE SEED LOCALIZATION Right 10/17/2020  ? Procedure: RIGHT BREAST LUMPECTOMY WITH RADIOACTIVE SEED LOCALIZATION WITH RIGHT SENTINEL LYMPH NODE BIOPSY;  Surgeon: Stark Klein, MD;   Location: [facility];  Service: General;  Laterality: Right;  ? CHOLECYSTECTOMY  12/2004  ? PORTACATH PLACEMENT Left 10/17/2020  ? Procedure: INSERTION PORT-A-CATH;  Surgeon: Stark Klein, MD;  Location: [facility];  Service: General;  Laterality: Left;  ? ? ?SOCIAL HISTORY: ?Social History  ? ?Socioeconomic History  ? Marital status: Divorced  ?  Spouse name: Corene Cornea  ? Number of children: 0  ? Years of education: MS  ? Highest education level: Not on file  ?Occupational History  ? Occupation: accountant  ?Tobacco Use  ? Smoking status: Never  ? Smokeless tobacco: Never  ?Vaping Use  ? Vaping Use: Never used  ?Substance and Sexual Activity  ? Alcohol use: Not Currently  ?  Comment: pt states 1 glass of wine maybe once a month  ? Drug use: No  ? Sexual activity: Yes  ?  Birth control/protection: Condom  ?Other Topics Concern  ? Not on file  ?Social History Narrative  ? Accountant for commercial firm; wants short term disability;  never smoked; 2 drinks a month. Lives in [place] with boy friend [place] No children. Has one brother   ? ?Social Determinants of Health  ? ?Financial Resource Strain: Not on file  ?Food Insecurity: Not on file  ?Transportation Needs: Not on file  ?Physical Activity: Not on file  ?Stress: Not on file  ?Social Connections: Not on file  ?Intimate Partner Violence: Not on file  ? ? ?FAMILY HISTORY: ?Family History  ?Problem Relation Age of Onset  ? Healthy Mother   ? Hypertension Father   ? Skin cancer Brother   ?     dx before 51  ? Colon cancer Maternal Aunt 75  ? Cervical cancer Maternal Aunt   ?     dx before 76; x2  ? Cancer Paternal Grandmother 71  ?     unknown primary; ? liver  ? Lung cancer Paternal Grandfather   ?     dx after 56  ? Breast cancer Cousin 36  ?     maternal cousin  ? Colon cancer Other   ?     MGM's sister  ? Gastric cancer Other   ?     PGM's sister  ? Leukemia Other   ?     PMG's sister  ? Rectal cancer Neg Hx   ? Esophageal cancer Neg Hx   ? ? ?ALLERGIES:   is allergic to antihistamines, diphenhydramine-type; diphenhydramine hcl; promethazine; and pseudoeph-doxylamine-dm-apap. ? ?MEDICATIONS:  ?Current Outpatient Medications  ?Medication Sig Dispense Refill  ? al

## 2021-04-27 ENCOUNTER — Encounter: Payer: Self-pay | Admitting: Internal Medicine

## 2021-04-29 ENCOUNTER — Encounter: Payer: Self-pay | Admitting: Nurse Practitioner

## 2021-04-29 ENCOUNTER — Other Ambulatory Visit: Payer: Self-pay | Admitting: *Deleted

## 2021-04-29 ENCOUNTER — Ambulatory Visit: Payer: PRIVATE HEALTH INSURANCE

## 2021-04-29 ENCOUNTER — Ambulatory Visit
Admission: RE | Admit: 2021-04-29 | Discharge: 2021-04-29 | Disposition: A | Discharge status: Home / Self Care | Payer: PRIVATE HEALTH INSURANCE | Source: Ambulatory Visit | Attending: Radiation Oncology | Admitting: Radiation Oncology

## 2021-04-29 DIAGNOSIS — Z5112 Encounter for antineoplastic immunotherapy: Secondary | ICD-10-CM | POA: Diagnosis not present

## 2021-04-29 MED ORDER — SILVER SULFADIAZINE 1 % EX CREA: 1.0000 "application " | TOPICAL_CREAM | Freq: Two times a day (BID) | CUTANEOUS | 2 refills | Status: DC

## 2021-04-30 ENCOUNTER — Ambulatory Visit: Payer: PRIVATE HEALTH INSURANCE

## 2021-04-30 ENCOUNTER — Ambulatory Visit
Admission: RE | Admit: 2021-04-30 | Discharge: 2021-04-30 | Disposition: A | Discharge status: Home / Self Care | Payer: PRIVATE HEALTH INSURANCE | Source: Ambulatory Visit | Attending: Radiation Oncology | Admitting: Radiation Oncology

## 2021-04-30 ENCOUNTER — Encounter: Payer: Self-pay | Admitting: Emergency Medicine

## 2021-04-30 DIAGNOSIS — Z5112 Encounter for antineoplastic immunotherapy: Secondary | ICD-10-CM | POA: Diagnosis not present

## 2021-05-01 ENCOUNTER — Ambulatory Visit: Payer: PRIVATE HEALTH INSURANCE

## 2021-05-01 ENCOUNTER — Ambulatory Visit
Admission: RE | Admit: 2021-05-01 | Discharge: 2021-05-01 | Disposition: A | Discharge status: Home / Self Care | Payer: PRIVATE HEALTH INSURANCE | Source: Ambulatory Visit | Attending: Radiation Oncology | Admitting: Radiation Oncology

## 2021-05-01 DIAGNOSIS — Z5112 Encounter for antineoplastic immunotherapy: Secondary | ICD-10-CM | POA: Diagnosis not present

## 2021-05-02 ENCOUNTER — Ambulatory Visit
Admission: RE | Admit: 2021-05-02 | Discharge: 2021-05-02 | Disposition: A | Discharge status: Home / Self Care | Payer: PRIVATE HEALTH INSURANCE | Source: Ambulatory Visit | Attending: Radiation Oncology | Admitting: Radiation Oncology

## 2021-05-02 DIAGNOSIS — Z5112 Encounter for antineoplastic immunotherapy: Secondary | ICD-10-CM | POA: Diagnosis not present

## 2021-05-03 ENCOUNTER — Ambulatory Visit
Admission: RE | Admit: 2021-05-03 | Discharge: 2021-05-03 | Disposition: A | Discharge status: Home / Self Care | Payer: PRIVATE HEALTH INSURANCE | Source: Ambulatory Visit | Attending: Radiation Oncology | Admitting: Radiation Oncology

## 2021-05-03 DIAGNOSIS — Z5112 Encounter for antineoplastic immunotherapy: Secondary | ICD-10-CM | POA: Diagnosis not present

## 2021-05-07 ENCOUNTER — Ambulatory Visit: Payer: PRIVATE HEALTH INSURANCE

## 2021-05-08 ENCOUNTER — Encounter (HOSPITAL_COMMUNITY): Payer: Self-pay

## 2021-05-08 ENCOUNTER — Encounter: Payer: Self-pay | Admitting: Neurology

## 2021-05-14 ENCOUNTER — Encounter: Payer: Self-pay | Admitting: Neurology

## 2021-05-15 ENCOUNTER — Other Ambulatory Visit: Payer: Self-pay | Admitting: *Deleted

## 2021-05-15 MED ORDER — TOPIRAMATE 100 MG PO TABS: 100.0000 mg | ORAL_TABLET | Freq: Two times a day (BID) | ORAL | 3 refills | Status: DC

## 2021-05-17 ENCOUNTER — Ambulatory Visit: Payer: PRIVATE HEALTH INSURANCE | Admitting: Internal Medicine

## 2021-05-17 ENCOUNTER — Other Ambulatory Visit: Payer: PRIVATE HEALTH INSURANCE

## 2021-05-17 ENCOUNTER — Ambulatory Visit: Payer: PRIVATE HEALTH INSURANCE

## 2021-05-28 ENCOUNTER — Ambulatory Visit: Payer: PRIVATE HEALTH INSURANCE | Admitting: Neurology

## 2021-05-29 ENCOUNTER — Ambulatory Visit (INDEPENDENT_AMBULATORY_CARE_PROVIDER_SITE_OTHER): Payer: PRIVATE HEALTH INSURANCE | Admitting: Neurology

## 2021-05-29 ENCOUNTER — Encounter: Payer: Self-pay | Admitting: Neurology

## 2021-05-29 VITALS — BP 124/73 | HR 74 | Ht 66.0 in | Wt 179.0 lb

## 2021-05-29 DIAGNOSIS — G43009 Migraine without aura, not intractable, without status migrainosus: Secondary | ICD-10-CM | POA: Insufficient documentation

## 2021-05-29 DIAGNOSIS — F418 Other specified anxiety disorders: Secondary | ICD-10-CM

## 2021-05-29 NOTE — Progress Notes (Signed)
? ?Chief Complaint  ?Patient presents with  ? Procedure  ?  Room 14, alone ?Botox 200 units ?  ? ? ? ? ?ASSESSMENT AND PLAN ? ?Alexandra Warren is a 41 y.o. female   ?Chronic migraine headaches ? Under good control with Botox injection every 3 months, Emgality 120 mg monthly ? Axerta 12.5 mg as needed ? ?Botox injection for chronic migraine prevention, injection was performed according to [place] protocol, ? ?5 units of Botox was injected into each side, for 31 injection sites, total of 155 units ? ?Bilateral frontalis 4 injection sites ?Bilateral corrugate 2 injection sites ?Procerus 1 injection sites. ?Bilateral temporalis 8 injection sites ?Bilateral occipitalis 6 injection sites ?Bilateral cervical paraspinals 4 injection sites ?Bilateral upper trapezius 6 injection sites ? ?Extra 45 unites were injected into upper cervical, levator scapular region ? ? ?DIAGNOSTIC DATA (LABS, IMAGING, TESTING) ?- I reviewed patient records, labs, notes, testing and imaging myself where available. ? ? ?MEDICAL HISTORY: ? ? ?She has migraine BOTOX emaglity doing so well has helped her migraine 3-4 times a month, this is the best, axert and fioriect prn did help her,  ? ? Alexandra Warren is a 41 year-old left-handed Caucasian female referred by her primary care physician PA Delman Cheadle for evaluation of migraine.  ?  ?She has history of migraines since 2008, approximately age 1 or 61, typical migraine is right parietal retro-orbital area with severe achy pain, sometimes was associated with light sensitivity and noise sensitivity, lasting a couple hours or until able to go to sleep. Responded very well to Axert. Triggers for her migraines are stress, hormonal change, weather change.  She can have up to 20-30 migraines days each month, responding to Axert 75% of the time. A quarter of the time even though she was taking repeated those of Axert, she would suffer prolonged severe protracted migraine that can last a couple of  days. She has tried Imitrex in the past which caused neck tightness.  ?  ?She has been taking Topamax 100 mg every morning, which has been very helpful, previously tried beta blockers and amitriptyline without help.  ?  ?She also has a history of depression and generalized anxiety taking Cymbalta 120 mg every day. Her father had history of migraine. She was previous under the care of local neurologist Dr. Silvio Pate who since has left the practice.  ?  ?MRI of the brain was normal 2009.  ?  ?She is now taking Topamax '100mg'$  bid and Riboflavin/Magnesium.  Axert is effective  ?  ?UPDATE July 14th 2015: ?She only had 2 migraines in past 1 month, now she is less stressful, while she is on summer medication, Axert continues to help, she also complains of excessive fatigue, sleepiness during the daytime, ESS score is 14, FSS is 59, ?  ?She denies snoring, has lost few pounds. But she has excessive daytime sleepiness, and fatigue. ?  ?UPDATE Mar 08 2018: ?Last visit was with Chrys Racer in July 2019, since then multiple phone conversation for increased headache, despite polypharmacy treatment, Cymbalta 60 mg daily, nortriptyline 20 mg at bedtime, clonazepam as needed, Trintellix 5 mg daily, she continue complains of 15 headache days each month, lateralized severe pounding headache with associated light noise sensitivity, lasting for hours to days, ?  ?She is taking Axert as needed, which helps her most of the time, also seeing psychiatrist for anxiety, chronic insomnia, ? ?UPDATE May 29 2021: ?Every 3 months Botox injection, and monthly Emgality injection has  really helped her migraine, she now has 1-2 migraine every week, responding to Axert that her, ?  ?She continues to suffer depression, anxiety, breast cancer treatment, was laid off from her job, tearful at today's visit ? ?PHYSICAL EXAM: ?  ?Vitals:  ? 05/29/21 1554  ?BP: 124/73  ?Pulse: 74  ?Weight: 179 lb (81.2 kg)  ?Height: '5\' 6"'$  (1.676 m)  ? ?Not recorded ?  ? ? ?Body  mass index is 28.89 kg/m?. ? ?PHYSICAL EXAMNIATION: ? ?Gen: NAD, conversant, well nourised, well groomed                     ?Cardiovascular: Regular rate rhythm, no peripheral edema, warm, nontender. ?Eyes: Conjunctivae clear without exudates or hemorrhage ?Neck: Supple, no carotid bruits. ?Pulmonary: Clear to auscultation bilaterally  ? ?NEUROLOGICAL EXAM: ? ?MENTAL STATUS: ?Speech: ?   Speech is normal; fluent and spontaneous with normal comprehension.  ?Cognition: ?    Orientation to time, place and person ?    Normal recent and remote memory ?    Normal Attention span and concentration ?    Normal Language, naming, repeating,spontaneous speech ?    Fund of knowledge ?  ?CRANIAL NERVES: ?CN II: Visual fields are full to confrontation. Pupils are round equal and briskly reactive to light. ?CN III, IV, VI: extraocular movement are normal. No ptosis. ?CN V: Facial sensation is intact to light touch ?CN VII: Face is symmetric with normal eye closure  ?CN VIII: Hearing is normal to causal conversation. ?CN IX, X: Phonation is normal. ?CN XI: Head turning and shoulder shrug are intact ? ?MOTOR: ?There is no pronator drift of out-stretched arms. Muscle bulk and tone are normal. Muscle strength is normal. ? ?REFLEXES: ?Reflexes are 2+ and symmetric at the biceps, triceps, knees, and ankles. Plantar responses are flexor. ? ?SENSORY: ?Intact to light touch, pinprick and vibratory sensation are intact in fingers and toes. ? ?COORDINATION: ?There is no trunk or limb dysmetria noted. ? ?GAIT/STANCE: ?Posture is normal. Gait is steady with normal steps, base, arm swing, and turning. Heel and toe walking are normal. Tandem gait is normal.  ?Romberg is absent. ? ?REVIEW OF SYSTEMS:  ?Full 14 system review of systems performed and notable only for as above ?All other review of systems were negative. ? ? ?ALLERGIES: ?Allergies  ?Allergen Reactions  ? Antihistamines, Diphenhydramine-Type Other (See Comments)  ? Diphenhydramine Hcl  Other (See Comments)  ?  Restless leg  ? Promethazine Other (See Comments)  ?  Tingling sensation  ? Pseudoeph-Doxylamine-Dm-Apap Other (See Comments)  ?  Nyquil- Tingling sensation in legs  ? ? ?HOME MEDICATIONS: ?Current Outpatient Medications  ?Medication Sig Dispense Refill  ? almotriptan (AXERT) 12.5 MG tablet Take 1 tab at onset of migraine.  May repeat in 2 hrs, if needed.  Max dose: 2 tabs/day. This is a 30 day prescription. 12 tablet 11  ? azelastine (ASTELIN) 0.1 % nasal spray Place 1 spray into both nostrils daily as needed for rhinitis.    ? benzonatate (TESSALON) 200 MG capsule Take 1 capsule (200 mg total) by mouth 3 (three) times daily as needed for cough. 20 capsule 0  ? BOTOX 200 units SOLR Inject 200 Units into the skin See admin instructions. Every 90 days    ? butalbital-acetaminophen-caffeine (FIORICET) 50-325-40 MG tablet Take 1 tablet by mouth every 8 (eight) hours as needed for headache. 10 tablet 5  ? celecoxib (CELEBREX) 200 MG capsule Take 200 mg by mouth daily.    ?  clindamycin (CLEOCIN T) 1 % external solution Apply topically 2 (two) times daily as needed.    ? clonazePAM (KLONOPIN) 1 MG tablet Take 0.5-1 mg by mouth 2 (two) times daily as needed for anxiety.    ? EMGALITY 120 MG/ML SOAJ Inject 120 mg into the skin every 30 (thirty) days. 3 mL 4  ? lidocaine-prilocaine (EMLA) cream Apply 1 application topically as needed. 30 g 4  ? omeprazole (PRILOSEC) 20 MG capsule Take 20 mg by mouth daily.    ? ondansetron (ZOFRAN) 8 MG tablet Take 1 tablet (8 mg total) by mouth every 8 (eight) hours as needed for nausea or vomiting. 30 tablet 3  ? potassium chloride SA (KLOR-CON M) 20 MEQ tablet 1 pill twice a day 30 tablet 3  ? prochlorperazine (COMPAZINE) 10 MG tablet Take 1 tablet (10 mg total) by mouth every 6 (six) hours as needed for nausea or vomiting. 40 tablet 1  ? rosuvastatin (CRESTOR) 20 MG tablet Take 20 mg by mouth daily.    ? silver sulfADIAZINE (SILVADENE) 1 % cream Apply 1  application. topically 2 (two) times daily. 85 g 2  ? tiZANidine (ZANAFLEX) 4 MG tablet Take 1 tablet (4 mg total) by mouth every 8 (eight) hours as needed for muscle spasms (migraines). This is a 30 day rx. 2

## 2021-05-29 NOTE — Progress Notes (Signed)
Botox- 200 units x 1 vial ?Lot: [id number] ?Expiration: 12/2023 ?[place]: [phone] ?  ?B/b ?

## 2021-05-31 MED ORDER — ONABOTULINUMTOXINA 100 UNITS IJ SOLR
200.0000 [IU] | Freq: Once | INTRAMUSCULAR | Status: AC
  Administered 2021-05-31: 200 [IU] via INTRAMUSCULAR

## 2021-06-06 ENCOUNTER — Ambulatory Visit: Payer: PRIVATE HEALTH INSURANCE | Admitting: Radiation Oncology

## 2021-06-12 ENCOUNTER — Encounter: Payer: Self-pay | Admitting: Nurse Practitioner

## 2021-07-01 ENCOUNTER — Ambulatory Visit: Payer: PRIVATE HEALTH INSURANCE

## 2021-07-17 ENCOUNTER — Encounter: Payer: Self-pay | Admitting: Neurology

## 2021-08-01 ENCOUNTER — Telehealth: Payer: Self-pay | Admitting: Neurology

## 2021-08-01 NOTE — Telephone Encounter (Signed)
I called [employer] spoke with Andree Moro, she stated I needed to do a PA for DOS 02/27/2021, she states they can retroauth claims from 180 days since this claim was just processed on 06/25/2021 there is plenty of time. She gave me the fax number to send clinicals for the PA. CLAIM # [id number], [id number] # [id number]. Reference number for today's call [id number].

## 2021-08-12 ENCOUNTER — Other Ambulatory Visit: Payer: Self-pay

## 2021-08-20 ENCOUNTER — Telehealth: Payer: Self-pay | Admitting: *Deleted

## 2021-08-20 NOTE — Telephone Encounter (Signed)
The patient has been getting Emgality and Botox together. She now has coverage with [employer]. Her new plan will not pay for any CGRP to be used concurrently with Botox. For now, she would like to keep her pending appt for Botox on 09/04/21. She plans to talk with Dr. Krista Blue about an alternate preventive to take that would allow her to continue Botox.

## 2021-08-26 ENCOUNTER — Ambulatory Visit: Payer: PRIVATE HEALTH INSURANCE | Admitting: Neurology

## 2021-08-27 NOTE — Telephone Encounter (Signed)
I called [employer] spoke to [place], she then transferred me to [place] in the PA department. Tammy informed me that patient specialty pharmacy is [place]. I called [facility] which is the pharmacy number on the back on the pt's insurance card, spoke to [place], she states a OA is needed to be done. Jayla faxed me a PA form to complete.

## 2021-08-30 DIAGNOSIS — F339 Major depressive disorder, recurrent, unspecified: Secondary | ICD-10-CM | POA: Diagnosis not present

## 2021-08-30 DIAGNOSIS — F411 Generalized anxiety disorder: Secondary | ICD-10-CM | POA: Diagnosis not present

## 2021-08-30 DIAGNOSIS — F410 Panic disorder [episodic paroxysmal anxiety] without agoraphobia: Secondary | ICD-10-CM | POA: Diagnosis not present

## 2021-08-30 DIAGNOSIS — G4700 Insomnia, unspecified: Secondary | ICD-10-CM | POA: Diagnosis not present

## 2021-09-02 DIAGNOSIS — L709 Acne, unspecified: Secondary | ICD-10-CM | POA: Diagnosis not present

## 2021-09-02 DIAGNOSIS — F411 Generalized anxiety disorder: Secondary | ICD-10-CM | POA: Diagnosis not present

## 2021-09-02 DIAGNOSIS — Z9189 Other specified personal risk factors, not elsewhere classified: Secondary | ICD-10-CM | POA: Diagnosis not present

## 2021-09-02 DIAGNOSIS — I427 Cardiomyopathy due to drug and external agent: Secondary | ICD-10-CM | POA: Diagnosis not present

## 2021-09-02 DIAGNOSIS — F329 Major depressive disorder, single episode, unspecified: Secondary | ICD-10-CM | POA: Diagnosis not present

## 2021-09-02 DIAGNOSIS — Z79810 Long term (current) use of selective estrogen receptor modulators (SERMs): Secondary | ICD-10-CM | POA: Diagnosis not present

## 2021-09-02 DIAGNOSIS — Z79899 Other long term (current) drug therapy: Secondary | ICD-10-CM | POA: Diagnosis not present

## 2021-09-02 DIAGNOSIS — M797 Fibromyalgia: Secondary | ICD-10-CM | POA: Diagnosis not present

## 2021-09-02 DIAGNOSIS — Z9221 Personal history of antineoplastic chemotherapy: Secondary | ICD-10-CM | POA: Diagnosis not present

## 2021-09-02 DIAGNOSIS — G4700 Insomnia, unspecified: Secondary | ICD-10-CM | POA: Diagnosis not present

## 2021-09-02 DIAGNOSIS — Z170 Estrogen receptor positive status [ER+]: Secondary | ICD-10-CM | POA: Diagnosis not present

## 2021-09-02 DIAGNOSIS — T451X5A Adverse effect of antineoplastic and immunosuppressive drugs, initial encounter: Secondary | ICD-10-CM | POA: Diagnosis not present

## 2021-09-02 DIAGNOSIS — F419 Anxiety disorder, unspecified: Secondary | ICD-10-CM | POA: Diagnosis not present

## 2021-09-02 DIAGNOSIS — C50211 Malignant neoplasm of upper-inner quadrant of right female breast: Secondary | ICD-10-CM | POA: Diagnosis not present

## 2021-09-02 DIAGNOSIS — Z5112 Encounter for antineoplastic immunotherapy: Secondary | ICD-10-CM | POA: Diagnosis not present

## 2021-09-03 ENCOUNTER — Telehealth: Payer: Self-pay

## 2021-09-03 NOTE — Telephone Encounter (Signed)
I attempted to call the patient. The phone rings, then voice mail picks up stating "Call cannot be completed at this time. Please try your call again later." WCB.

## 2021-09-03 NOTE — Telephone Encounter (Signed)
We received a prior authorization form for the patient's Botox 200 units. I called [facility] and spoke with Selena to confirm if it was okay to use office supply for the patient. She stated they had requested more information and had not heard back from our office. I inquired if it was the prior authorization form, which she confirmed. She requested the PA form to be faxed to [facility] at Fax #: [phone]. I have faxed the PA to the number on file.  The patient is on schedule for Botox tomorrow (09/03/2021). Selena stated there was no estimated turn around time for PA approval.  The patient's appointment will need to be rescheduled. I have made multiple attempts to contact the patient. I left a message with her father, who is her alternate contact to give the office a call. He stated he would relay the message. I will attempt to contact the patient again later today.

## 2021-09-04 ENCOUNTER — Ambulatory Visit: Payer: PRIVATE HEALTH INSURANCE | Admitting: Neurology

## 2021-09-04 DIAGNOSIS — F339 Major depressive disorder, recurrent, unspecified: Secondary | ICD-10-CM | POA: Diagnosis not present

## 2021-09-04 DIAGNOSIS — G4700 Insomnia, unspecified: Secondary | ICD-10-CM | POA: Diagnosis not present

## 2021-09-04 DIAGNOSIS — F410 Panic disorder [episodic paroxysmal anxiety] without agoraphobia: Secondary | ICD-10-CM | POA: Diagnosis not present

## 2021-09-04 DIAGNOSIS — F411 Generalized anxiety disorder: Secondary | ICD-10-CM | POA: Diagnosis not present

## 2021-09-05 ENCOUNTER — Telehealth: Payer: Self-pay

## 2021-09-05 NOTE — Telephone Encounter (Signed)
I spoke with representative Jimmy G at [employer]. The patient's Botox 200 units for dates of service 02/27/2021 and 05/29/2021 should have been speciality pharmacy, they were instead buy and bill.  The representative stated he spoke with the Botox coordinator at the time and informed her that there was no PA on file for those dates of service, therefore the medication was not approved.  I attempted to get retro authorization for the dates of service. The representative informed me that they cannot provide retro authorization for dates they never authorized. Reference #: [id number]

## 2021-09-05 NOTE — Telephone Encounter (Signed)
I spoke with the patient. She now has new insurance. A new PA will be submitted for authorization.

## 2021-09-12 DIAGNOSIS — G4700 Insomnia, unspecified: Secondary | ICD-10-CM | POA: Diagnosis not present

## 2021-09-12 DIAGNOSIS — F339 Major depressive disorder, recurrent, unspecified: Secondary | ICD-10-CM | POA: Diagnosis not present

## 2021-09-12 DIAGNOSIS — F411 Generalized anxiety disorder: Secondary | ICD-10-CM | POA: Diagnosis not present

## 2021-09-12 DIAGNOSIS — F410 Panic disorder [episodic paroxysmal anxiety] without agoraphobia: Secondary | ICD-10-CM | POA: Diagnosis not present

## 2021-09-16 DIAGNOSIS — C50211 Malignant neoplasm of upper-inner quadrant of right female breast: Secondary | ICD-10-CM | POA: Diagnosis not present

## 2021-09-16 DIAGNOSIS — Z170 Estrogen receptor positive status [ER+]: Secondary | ICD-10-CM | POA: Diagnosis not present

## 2021-09-19 NOTE — Telephone Encounter (Signed)
A [employer] PA form has been completed. It is in MD's basket awaiting signature.  I spoke with Germain Osgood at [facility]. She stated B/B was appropriate with the patient's insurance plan. She directed any further questions to #: [phone].

## 2021-09-24 ENCOUNTER — Encounter: Payer: Self-pay | Admitting: Neurology

## 2021-09-24 DIAGNOSIS — Z79899 Other long term (current) drug therapy: Secondary | ICD-10-CM | POA: Diagnosis not present

## 2021-09-24 DIAGNOSIS — F329 Major depressive disorder, single episode, unspecified: Secondary | ICD-10-CM | POA: Diagnosis not present

## 2021-09-24 DIAGNOSIS — L709 Acne, unspecified: Secondary | ICD-10-CM | POA: Diagnosis not present

## 2021-09-24 DIAGNOSIS — M797 Fibromyalgia: Secondary | ICD-10-CM | POA: Diagnosis not present

## 2021-09-24 DIAGNOSIS — G4700 Insomnia, unspecified: Secondary | ICD-10-CM | POA: Diagnosis not present

## 2021-09-24 DIAGNOSIS — Z5112 Encounter for antineoplastic immunotherapy: Secondary | ICD-10-CM | POA: Diagnosis not present

## 2021-09-24 DIAGNOSIS — C50211 Malignant neoplasm of upper-inner quadrant of right female breast: Secondary | ICD-10-CM | POA: Diagnosis not present

## 2021-09-24 DIAGNOSIS — Z170 Estrogen receptor positive status [ER+]: Secondary | ICD-10-CM | POA: Diagnosis not present

## 2021-09-24 DIAGNOSIS — Z79810 Long term (current) use of selective estrogen receptor modulators (SERMs): Secondary | ICD-10-CM | POA: Diagnosis not present

## 2021-09-24 DIAGNOSIS — F411 Generalized anxiety disorder: Secondary | ICD-10-CM | POA: Diagnosis not present

## 2021-09-24 DIAGNOSIS — Z9049 Acquired absence of other specified parts of digestive tract: Secondary | ICD-10-CM | POA: Diagnosis not present

## 2021-09-24 NOTE — Telephone Encounter (Signed)
The PA form has been signed and faxed to [facility]. Fax #: [phone].

## 2021-09-28 ENCOUNTER — Other Ambulatory Visit: Payer: Self-pay

## 2021-10-01 ENCOUNTER — Telehealth: Payer: Self-pay

## 2021-10-01 NOTE — Telephone Encounter (Signed)
Pt needs botox auth for migraine protocol  Dx G43.009 Q 3 month 155 units into the head and neck muscles.

## 2021-10-02 ENCOUNTER — Other Ambulatory Visit (HOSPITAL_COMMUNITY): Payer: Self-pay

## 2021-10-02 ENCOUNTER — Encounter: Payer: Self-pay | Admitting: Internal Medicine

## 2021-10-02 ENCOUNTER — Telehealth (HOSPITAL_COMMUNITY): Payer: Self-pay | Admitting: Pharmacy Technician

## 2021-10-02 NOTE — Telephone Encounter (Signed)
Patient Advocate Encounter   Received notification that prior authorization for Botox 200UNIT solution is required.   PA submitted on 10/02/2021 Key [id number] Status is pending       Lyndel Safe, [place] Patient Advocate Specialist [place] Patient Advocate Team Direct Number: [phone]  Fax: [phone]

## 2021-10-03 ENCOUNTER — Encounter: Payer: Self-pay | Admitting: Internal Medicine

## 2021-10-03 ENCOUNTER — Other Ambulatory Visit: Payer: BC Managed Care – PPO

## 2021-10-03 ENCOUNTER — Ambulatory Visit (INDEPENDENT_AMBULATORY_CARE_PROVIDER_SITE_OTHER): Payer: BC Managed Care – PPO

## 2021-10-03 ENCOUNTER — Ambulatory Visit
Admission: RE | Admit: 2021-10-03 | Discharge: 2021-10-03 | Disposition: A | Discharge status: Home / Self Care | Payer: BC Managed Care – PPO | Source: Ambulatory Visit | Attending: Emergency Medicine | Admitting: Emergency Medicine

## 2021-10-03 VITALS — BP 113/72 | HR 97 | Temp 98.2°F | Resp 16

## 2021-10-03 DIAGNOSIS — Z3202 Encounter for pregnancy test, result negative: Secondary | ICD-10-CM

## 2021-10-03 DIAGNOSIS — U071 COVID-19: Secondary | ICD-10-CM

## 2021-10-03 DIAGNOSIS — R058 Other specified cough: Secondary | ICD-10-CM

## 2021-10-03 DIAGNOSIS — R059 Cough, unspecified: Secondary | ICD-10-CM | POA: Diagnosis not present

## 2021-10-03 LAB — POCT URINE PREGNANCY: Preg Test, Ur: NEGATIVE

## 2021-10-03 MED ORDER — BENZONATATE 100 MG PO CAPS: 100.0000 mg | ORAL_CAPSULE | Freq: Three times a day (TID) | ORAL | 0 refills | Status: DC | PRN

## 2021-10-03 NOTE — ED Provider Notes (Signed)
Alexandra Warren    CSN: [id number] Arrival date & time: 10/03/21  1403      History   Chief Complaint Chief Complaint  Patient presents with   Cough    Covid - Entered by patient    HPI Alexandra Warren is a 41 y.o. female.  Patient presents with 4-day history of fever, ear pain, and cough.  Patient states her cough is getting worse and is now productive of yellow sputum.  She has shortness of breath when she has a coughing episode.  She tested positive for COVID at home on 09/29/2021; her PCP prescribed Paxlovid and Robitussin-DM on 09/30/2021.  No rash, sore throat, chest pain, vomiting, diarrhea, or other symptoms.  She took Tylenol today.  Her medical history includes breast cancer, fibromyalgia, migraine headaches, anxiety disorder, depression, insomnia, acid reflux.  LMP 3 months ago.   The history is provided by the patient and medical records.    Past Medical History:  Diagnosis Date   Acid reflux    Anxiety and depression    Dr. Gala Murdoch, MD   Back pain    Family history of breast cancer 09/21/2020   Family history of malignant neoplasm of digestive organ 09/21/2020   Fibromyalgia    History of gallstones    Malignant neoplasm of upper-inner quadrant of right female breast ([place]) 09/21/2020   Migraine     Patient Active Problem List   Diagnosis Date Noted   Migraine without aura and without status migrainosus, not intractable 05/29/2021   Depression with anxiety 05/29/2021   Genetic testing 10/02/2020   Carcinoma of upper-inner quadrant of right breast in female, estrogen receptor positive ([place]) 09/21/2020   Family history of breast cancer 09/21/2020   Family history of malignant neoplasm of digestive organ 09/21/2020   Insomnia 08/02/2013   Migraine without aura 07/27/2012   Generalized anxiety disorder 07/27/2012   Depression 07/27/2012    Past Surgical History:  Procedure Laterality Date   BREAST LUMPECTOMY WITH RADIOACTIVE SEED LOCALIZATION Right  10/17/2020   Procedure: RIGHT BREAST LUMPECTOMY WITH RADIOACTIVE SEED LOCALIZATION WITH RIGHT SENTINEL LYMPH NODE BIOPSY;  Surgeon: Stark Klein, MD;  Location: [facility];  Service: General;  Laterality: Right;   CHOLECYSTECTOMY  12/2004   PORTACATH PLACEMENT Left 10/17/2020   Procedure: INSERTION PORT-A-CATH;  Surgeon: Stark Klein, MD;  Location: [place];  Service: General;  Laterality: Left;    OB History   No obstetric history on file.      Home Medications    Prior to Admission medications   Medication Sig Start Date End Date Taking? Authorizing Provider  benzonatate (TESSALON) 100 MG capsule Take 1 capsule (100 mg total) by mouth 3 (three) times daily as needed for cough. 10/03/21  Yes Sharion Balloon, NP  almotriptan (AXERT) 12.5 MG tablet Take 1 tab at onset of migraine.  May repeat in 2 hrs, if needed.  Max dose: 2 tabs/day. This is a 30 day prescription. 11/14/20   Marcial Pacas, MD  azelastine (ASTELIN) 0.1 % nasal spray Place 1 spray into both nostrils daily as needed for rhinitis. 07/30/18   [provider]  BOTOX 200 units SOLR Inject 200 Units into the skin See admin instructions. Every 90 days 05/23/20   [provider]  butalbital-acetaminophen-caffeine (FIORICET) 50-325-40 MG tablet Take 1 tablet by mouth every 8 (eight) hours as needed for headache. 11/14/20   Marcial Pacas, MD  celecoxib (CELEBREX) 200 MG capsule Take 200 mg by mouth daily. 03/19/21  [provider]  clindamycin (CLEOCIN T) 1 % external solution Apply topically 2 (two) times daily as needed. 01/16/21   [provider]  clonazePAM (KLONOPIN) 1 MG tablet Take 0.5-1 mg by mouth 2 (two) times daily as needed for anxiety.    [provider]  EMGALITY 120 MG/ML SOAJ Inject 120 mg into the skin every 30 (thirty) days. 02/20/21   Marcial Pacas, MD  lidocaine-prilocaine (EMLA) cream Apply 1 application topically as needed. 12/21/20   Cammie Sickle, MD  omeprazole (PRILOSEC) 20 MG  capsule Take 20 mg by mouth daily.    [provider]  ondansetron (ZOFRAN) 8 MG tablet Take 1 tablet (8 mg total) by mouth every 8 (eight) hours as needed for nausea or vomiting. 11/09/20   Cammie Sickle, MD  potassium chloride SA (KLOR-CON M) 20 MEQ tablet 1 pill twice a day 02/08/21   Cammie Sickle, MD  prochlorperazine (COMPAZINE) 10 MG tablet Take 1 tablet (10 mg total) by mouth every 6 (six) hours as needed for nausea or vomiting. 12/28/20   Cammie Sickle, MD  rosuvastatin (CRESTOR) 20 MG tablet Take 20 mg by mouth daily.    [provider]  silver sulfADIAZINE (SILVADENE) 1 % cream Apply 1 application. topically 2 (two) times daily. 04/29/21   Noreene Filbert, MD  tiZANidine (ZANAFLEX) 4 MG tablet Take 1 tablet (4 mg total) by mouth every 8 (eight) hours as needed for muscle spasms (migraines). This is a 30 day rx. 09/25/20   Marcial Pacas, MD  topiramate (TOPAMAX) 100 MG tablet Take 1 tablet (100 mg total) by mouth 2 (two) times daily. 05/15/21   Marcial Pacas, MD  traMADol (ULTRAM) 50 MG tablet Take 50 mg by mouth every 6 (six) hours as needed for pain.    [provider]  vortioxetine HBr (TRINTELLIX) 20 MG TABS tablet Take 20 mg by mouth daily. 10/02/16   [provider]  zolpidem (AMBIEN) 10 MG tablet Take 10 mg by mouth at bedtime as needed for sleep. 02/07/20   [provider]    Family History Family History  Problem Relation Age of Onset   Healthy Mother    Hypertension Father    Skin cancer Brother        dx before 93   Colon cancer Maternal Aunt 26   Cervical cancer Maternal Aunt        dx before 79; x2   Cancer Paternal Grandmother 99       unknown primary; ? liver   Lung cancer Paternal Grandfather        dx after 25   Breast cancer Cousin 62       maternal cousin   Colon cancer Other        MGM's sister   Gastric cancer Other        PGM's sister   Leukemia Other        PMG's sister   Rectal cancer Neg Hx     Esophageal cancer Neg Hx     Social History Social History   Tobacco Use   Smoking status: Never   Smokeless tobacco: Never  Vaping Use   Vaping Use: Never used  Substance Use Topics   Alcohol use: Not Currently    Comment: pt states 1 glass of wine maybe once a month   Drug use: No     Allergies   Antihistamines, diphenhydramine-type; Diphenhydramine hcl; Promethazine; and Pseudoeph-doxylamine-dm-apap   Review of Systems Review  of Systems  Constitutional:  Positive for fever. Negative for chills.  HENT:  Positive for ear pain. Negative for sore throat.   Respiratory:  Positive for cough and shortness of breath.   Cardiovascular:  Negative for chest pain and palpitations.  Gastrointestinal:  Negative for diarrhea and vomiting.  Skin:  Negative for color change and rash.  All other systems reviewed and are negative.    Physical Exam Triage Vital Signs ED Triage Vitals  Enc Vitals Group     BP      Pulse      Resp      Temp      Temp src      SpO2      Weight      Height      Head Circumference      Peak Flow      Pain Score      Pain Loc      Pain Edu?      Excl. in [place]?    No data found.  Updated Vital Signs BP 113/72 (BP Location: Left Arm)   Pulse 97   Temp 98.2 F (36.8 C)   Resp 16   LMP  (Within Months) Comment: 3 Months ago  SpO2 98%   Visual Acuity Right Eye Distance:   Left Eye Distance:   Bilateral Distance:    Right Eye Near:   Left Eye Near:    Bilateral Near:     Physical Exam Vitals and nursing note reviewed.  Constitutional:      General: She is not in acute distress.    Appearance: Normal appearance. She is well-developed. She is ill-appearing.  HENT:     Right Ear: Tympanic membrane normal.     Left Ear: Tympanic membrane normal.     Nose: Nose normal.     Mouth/Throat:     Mouth: Mucous membranes are moist.     Pharynx: Oropharynx is clear.  Cardiovascular:     Rate and Rhythm: Normal rate and regular rhythm.      Heart sounds: Normal heart sounds.  Pulmonary:     Effort: Pulmonary effort is normal. No respiratory distress.     Breath sounds: Normal breath sounds.  Musculoskeletal:     Cervical back: Neck supple.  Skin:    General: Skin is warm and dry.  Neurological:     Mental Status: She is alert.  Psychiatric:        Mood and Affect: Mood normal.        Behavior: Behavior normal.      UC Treatments / Results  Labs (all labs ordered are listed, but only abnormal results are displayed) Labs Reviewed  POCT URINE PREGNANCY    EKG   Radiology DG Chest 2 View  Result Date: 10/03/2021 CLINICAL DATA:  Productive cough.  COVID positive. EXAM: CHEST - 2 VIEW COMPARISON:  October 17, 2020 FINDINGS: Left subclavian injectable port in stable position. Cardiomediastinal silhouette is normal. Mediastinal contours appear intact. There is no evidence of focal airspace consolidation, pleural effusion or pneumothorax. Osseous structures are without acute abnormality. Soft tissues are grossly normal. IMPRESSION: No active cardiopulmonary disease. Electronically Signed   By: Fidela Salisbury M.D.   On: 10/03/2021 15:19    Procedures Procedures (including critical care time)  Medications Ordered in UC Medications - No data to display  Initial Impression / Assessment and Plan / UC Course  I have reviewed the triage vital signs and the nursing notes.  Pertinent labs & imaging results that were available during my care of the patient were reviewed by me and considered in my medical decision making (see chart for details).   COVID-19, productive cough, negative pregnancy test.  LMP 3 months ago; pregnancy test negative today.  Patient tested COVID positive at home on 09/29/2021; she is on Paxlovid which was prescribed by her PCP on 09/30/2021.  Patient states her cough has become productive of yellow sputum.  Chest x-ray clear.  Afebrile and vital signs are stable.  Lungs are clear, O2 sat 98% on room  air.  Instructed patient to continue the Paxlovid as prescribed by her physician.  Treating cough with Tessalon Perles.  ED precautions discussed.  Instructed patient to follow-up with her PCP.  She agrees to plan of care.   Final Clinical Impressions(s) / UC Diagnoses   Final diagnoses:  COVID-19  Productive cough  Negative pregnancy test     Discharge Instructions      Your chest xray is clear.  Continue the Paxlovid as directed by your doctor.   Take the [facility] as needed for cough.   Go to the emergency department if you have shortness of breath or other concerning symptoms.    Follow up with your primary care provider.        ED Prescriptions     Medication Sig Dispense Auth. Provider   benzonatate (TESSALON) 100 MG capsule Take 1 capsule (100 mg total) by mouth 3 (three) times daily as needed for cough. 21 capsule Sharion Balloon, NP      PDMP not reviewed this encounter.   Sharion Balloon, NP 10/03/21 1537

## 2021-10-03 NOTE — Discharge Instructions (Addendum)
Your chest xray is clear.  Continue the Paxlovid as directed by your doctor.   Take the [facility] as needed for cough.   Go to the emergency department if you have shortness of breath or other concerning symptoms.    Follow up with your primary care provider.

## 2021-10-03 NOTE — ED Triage Notes (Signed)
Patient presents to UC for cough and fever x 4 days. Tested positive for COVID. Treated with anti-virals, cough still present.

## 2021-10-05 ENCOUNTER — Other Ambulatory Visit: Payer: Self-pay

## 2021-10-07 DIAGNOSIS — R053 Chronic cough: Secondary | ICD-10-CM | POA: Diagnosis not present

## 2021-10-07 DIAGNOSIS — J40 Bronchitis, not specified as acute or chronic: Secondary | ICD-10-CM | POA: Diagnosis not present

## 2021-10-11 ENCOUNTER — Encounter: Payer: Self-pay | Admitting: Internal Medicine

## 2021-10-14 DIAGNOSIS — C50211 Malignant neoplasm of upper-inner quadrant of right female breast: Secondary | ICD-10-CM | POA: Diagnosis not present

## 2021-10-14 DIAGNOSIS — Z5112 Encounter for antineoplastic immunotherapy: Secondary | ICD-10-CM | POA: Diagnosis not present

## 2021-10-14 DIAGNOSIS — Z170 Estrogen receptor positive status [ER+]: Secondary | ICD-10-CM | POA: Diagnosis not present

## 2021-10-14 DIAGNOSIS — R928 Other abnormal and inconclusive findings on diagnostic imaging of breast: Secondary | ICD-10-CM | POA: Diagnosis not present

## 2021-10-16 NOTE — Telephone Encounter (Signed)
Received PW back for botox stating PA is not required for botox 200 unit vial. I assume this means the pt can be scheduled as B/B will confirm with pharmacy.

## 2021-10-17 ENCOUNTER — Other Ambulatory Visit (HOSPITAL_COMMUNITY): Payer: Self-pay

## 2021-10-17 ENCOUNTER — Other Ambulatory Visit: Payer: Self-pay

## 2021-10-17 DIAGNOSIS — F339 Major depressive disorder, recurrent, unspecified: Secondary | ICD-10-CM | POA: Diagnosis not present

## 2021-10-17 DIAGNOSIS — F411 Generalized anxiety disorder: Secondary | ICD-10-CM | POA: Diagnosis not present

## 2021-10-17 DIAGNOSIS — G4700 Insomnia, unspecified: Secondary | ICD-10-CM | POA: Diagnosis not present

## 2021-10-17 DIAGNOSIS — F410 Panic disorder [episodic paroxysmal anxiety] without agoraphobia: Secondary | ICD-10-CM | POA: Diagnosis not present

## 2021-10-17 NOTE — Telephone Encounter (Signed)
Patient Advocate Encounter  Received notification that the request for prior authorization for Botox 200UNIT solution  has been denied due to Not using a CGRP.  This is being appealed due to patient being on Emgality.     This encounter will continue to be updated until final determination.    Lyndel Safe, [place] Patient Advocate Specialist [place] Patient Advocate Team Direct Number: [phone]  Fax: [phone]

## 2021-10-17 NOTE — Telephone Encounter (Signed)
Noted  

## 2021-11-04 DIAGNOSIS — Z23 Encounter for immunization: Secondary | ICD-10-CM | POA: Diagnosis not present

## 2021-11-04 DIAGNOSIS — Z170 Estrogen receptor positive status [ER+]: Secondary | ICD-10-CM | POA: Diagnosis not present

## 2021-11-04 DIAGNOSIS — C50211 Malignant neoplasm of upper-inner quadrant of right female breast: Secondary | ICD-10-CM | POA: Diagnosis not present

## 2021-11-04 DIAGNOSIS — F411 Generalized anxiety disorder: Secondary | ICD-10-CM | POA: Diagnosis not present

## 2021-11-04 DIAGNOSIS — Z2821 Immunization not carried out because of patient refusal: Secondary | ICD-10-CM | POA: Diagnosis not present

## 2021-11-04 DIAGNOSIS — L709 Acne, unspecified: Secondary | ICD-10-CM | POA: Diagnosis not present

## 2021-11-04 DIAGNOSIS — N921 Excessive and frequent menstruation with irregular cycle: Secondary | ICD-10-CM | POA: Diagnosis not present

## 2021-11-04 DIAGNOSIS — Z5112 Encounter for antineoplastic immunotherapy: Secondary | ICD-10-CM | POA: Diagnosis not present

## 2021-11-04 DIAGNOSIS — F32A Depression, unspecified: Secondary | ICD-10-CM | POA: Diagnosis not present

## 2021-11-04 DIAGNOSIS — Z79899 Other long term (current) drug therapy: Secondary | ICD-10-CM | POA: Diagnosis not present

## 2021-11-04 DIAGNOSIS — M797 Fibromyalgia: Secondary | ICD-10-CM | POA: Diagnosis not present

## 2021-11-13 ENCOUNTER — Encounter: Payer: Self-pay | Admitting: Neurology

## 2021-11-14 ENCOUNTER — Other Ambulatory Visit: Payer: Self-pay

## 2021-11-19 DIAGNOSIS — F411 Generalized anxiety disorder: Secondary | ICD-10-CM | POA: Diagnosis not present

## 2021-11-19 DIAGNOSIS — F410 Panic disorder [episodic paroxysmal anxiety] without agoraphobia: Secondary | ICD-10-CM | POA: Diagnosis not present

## 2021-11-19 DIAGNOSIS — F339 Major depressive disorder, recurrent, unspecified: Secondary | ICD-10-CM | POA: Diagnosis not present

## 2021-11-19 DIAGNOSIS — G4700 Insomnia, unspecified: Secondary | ICD-10-CM | POA: Diagnosis not present

## 2021-11-25 DIAGNOSIS — Z170 Estrogen receptor positive status [ER+]: Secondary | ICD-10-CM | POA: Diagnosis not present

## 2021-11-25 DIAGNOSIS — C50211 Malignant neoplasm of upper-inner quadrant of right female breast: Secondary | ICD-10-CM | POA: Diagnosis not present

## 2021-11-25 DIAGNOSIS — Z5112 Encounter for antineoplastic immunotherapy: Secondary | ICD-10-CM | POA: Diagnosis not present

## 2021-11-25 DIAGNOSIS — Z452 Encounter for adjustment and management of vascular access device: Secondary | ICD-10-CM | POA: Diagnosis not present

## 2021-11-25 DIAGNOSIS — Z79899 Other long term (current) drug therapy: Secondary | ICD-10-CM | POA: Diagnosis not present

## 2021-11-26 ENCOUNTER — Other Ambulatory Visit: Payer: Self-pay

## 2021-12-10 NOTE — Telephone Encounter (Signed)
PA was denied. Info sent to Pharmacist for appeal.   Please be advised appeals may take up to 5 business days to be submitted as pharmacist prepares necessary documentation. Thank you!

## 2021-12-16 DIAGNOSIS — F339 Major depressive disorder, recurrent, unspecified: Secondary | ICD-10-CM | POA: Diagnosis not present

## 2021-12-16 DIAGNOSIS — F410 Panic disorder [episodic paroxysmal anxiety] without agoraphobia: Secondary | ICD-10-CM | POA: Diagnosis not present

## 2021-12-16 DIAGNOSIS — G4700 Insomnia, unspecified: Secondary | ICD-10-CM | POA: Diagnosis not present

## 2021-12-16 DIAGNOSIS — F411 Generalized anxiety disorder: Secondary | ICD-10-CM | POA: Diagnosis not present

## 2021-12-17 ENCOUNTER — Other Ambulatory Visit (HOSPITAL_COMMUNITY): Payer: Self-pay

## 2021-12-24 ENCOUNTER — Other Ambulatory Visit (HOSPITAL_COMMUNITY): Payer: Self-pay

## 2021-12-24 NOTE — Telephone Encounter (Signed)
She is also on Emgality, Axert as needed from our office,  Okay to nurse practitioner follow-up in 3-4  month

## 2021-12-25 NOTE — Telephone Encounter (Signed)
Contacted pt to schedule appt. Pt said is at work and will call back sometime this week.

## 2021-12-27 DIAGNOSIS — F411 Generalized anxiety disorder: Secondary | ICD-10-CM | POA: Diagnosis not present

## 2021-12-27 DIAGNOSIS — G4700 Insomnia, unspecified: Secondary | ICD-10-CM | POA: Diagnosis not present

## 2021-12-27 DIAGNOSIS — F339 Major depressive disorder, recurrent, unspecified: Secondary | ICD-10-CM | POA: Diagnosis not present

## 2022-01-06 DIAGNOSIS — L282 Other prurigo: Secondary | ICD-10-CM | POA: Diagnosis not present

## 2022-01-06 DIAGNOSIS — N920 Excessive and frequent menstruation with regular cycle: Secondary | ICD-10-CM | POA: Diagnosis not present

## 2022-01-06 DIAGNOSIS — Z124 Encounter for screening for malignant neoplasm of cervix: Secondary | ICD-10-CM | POA: Diagnosis not present

## 2022-01-07 DIAGNOSIS — F339 Major depressive disorder, recurrent, unspecified: Secondary | ICD-10-CM | POA: Diagnosis not present

## 2022-01-07 DIAGNOSIS — F410 Panic disorder [episodic paroxysmal anxiety] without agoraphobia: Secondary | ICD-10-CM | POA: Diagnosis not present

## 2022-01-07 DIAGNOSIS — F411 Generalized anxiety disorder: Secondary | ICD-10-CM | POA: Diagnosis not present

## 2022-01-07 DIAGNOSIS — R6889 Other general symptoms and signs: Secondary | ICD-10-CM | POA: Diagnosis not present

## 2022-01-08 IMAGING — MG MM BREAST LOCALIZATION CLIP
4 series · 4 of 12 positions shown · non-contrast
Comparison: Previous exam(s).

CLINICAL DATA: Post biopsy mammogram of the right breast for clip
placement.

EXAM:
3D DIAGNOSTIC RIGHT MAMMOGRAM POST ULTRASOUND BIOPSY

[R ML synth-2D]
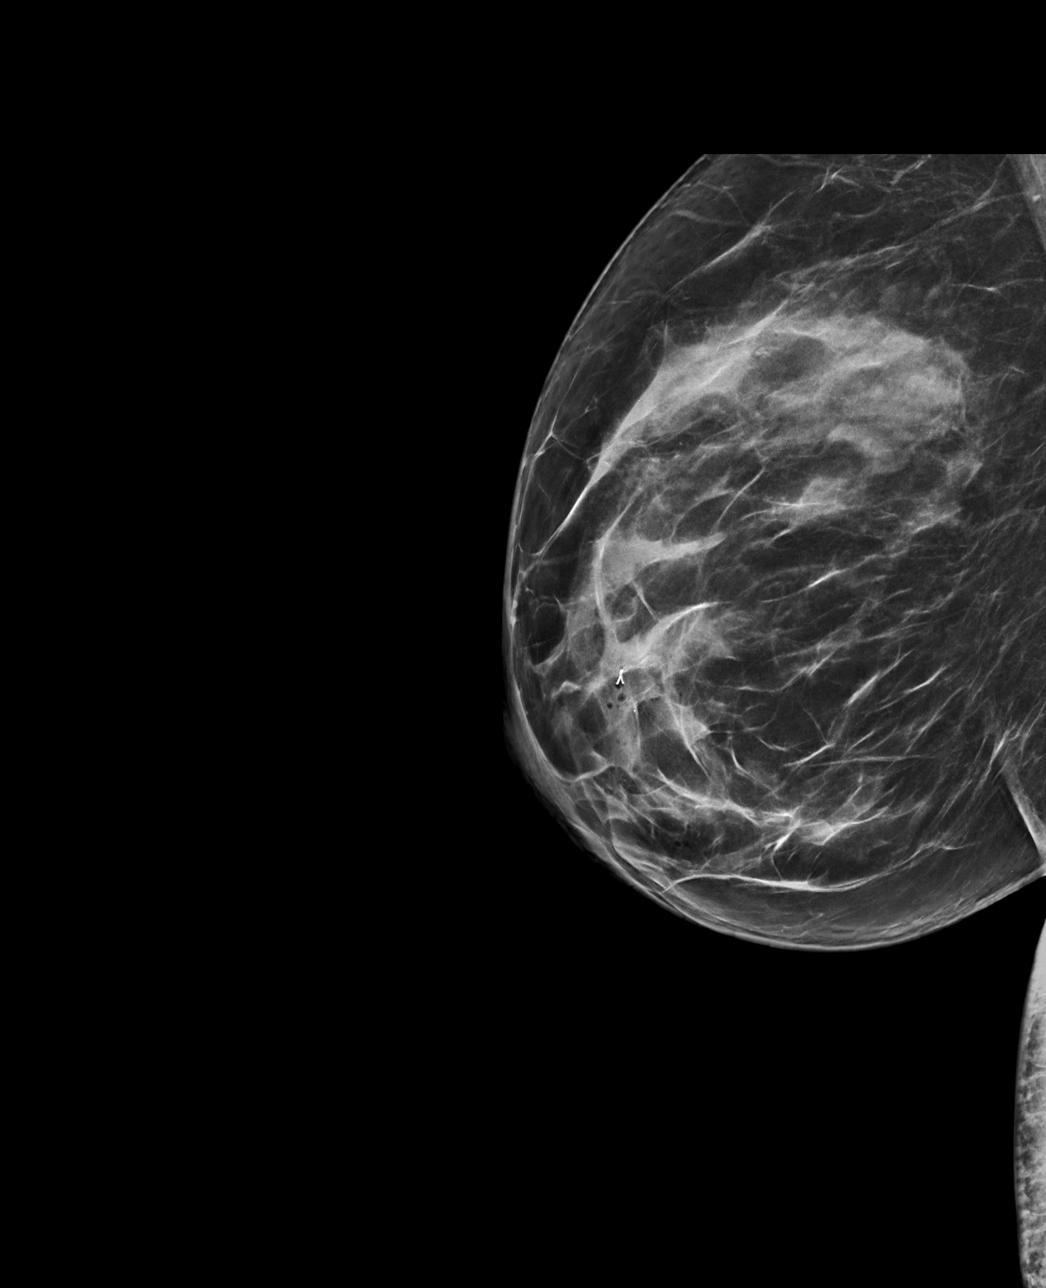

[R CC synth-2D]
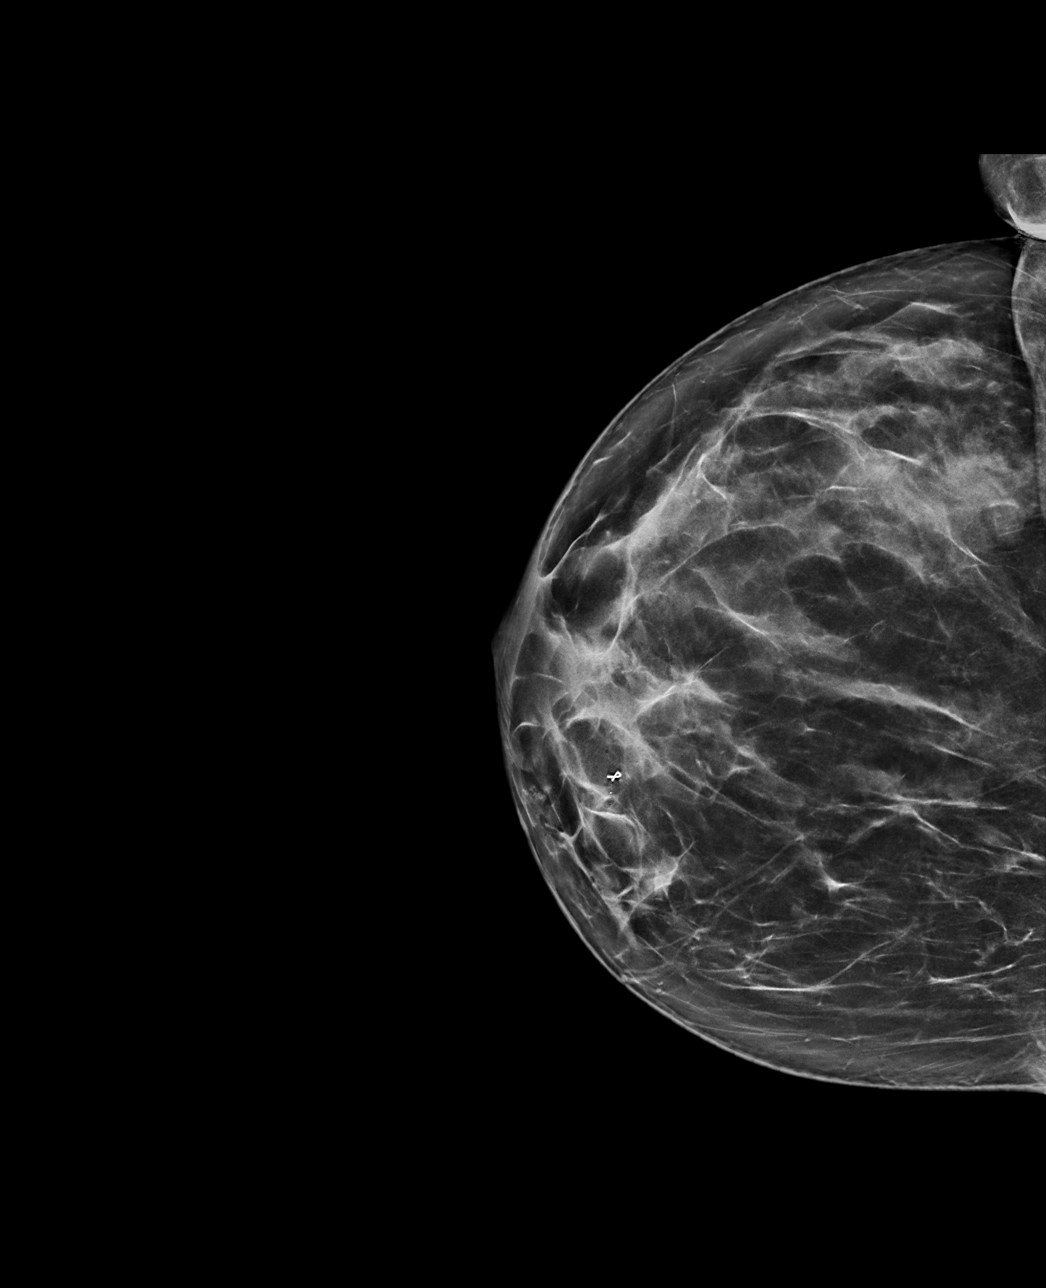

[R ML tomo · tomo slice 42/83.0]
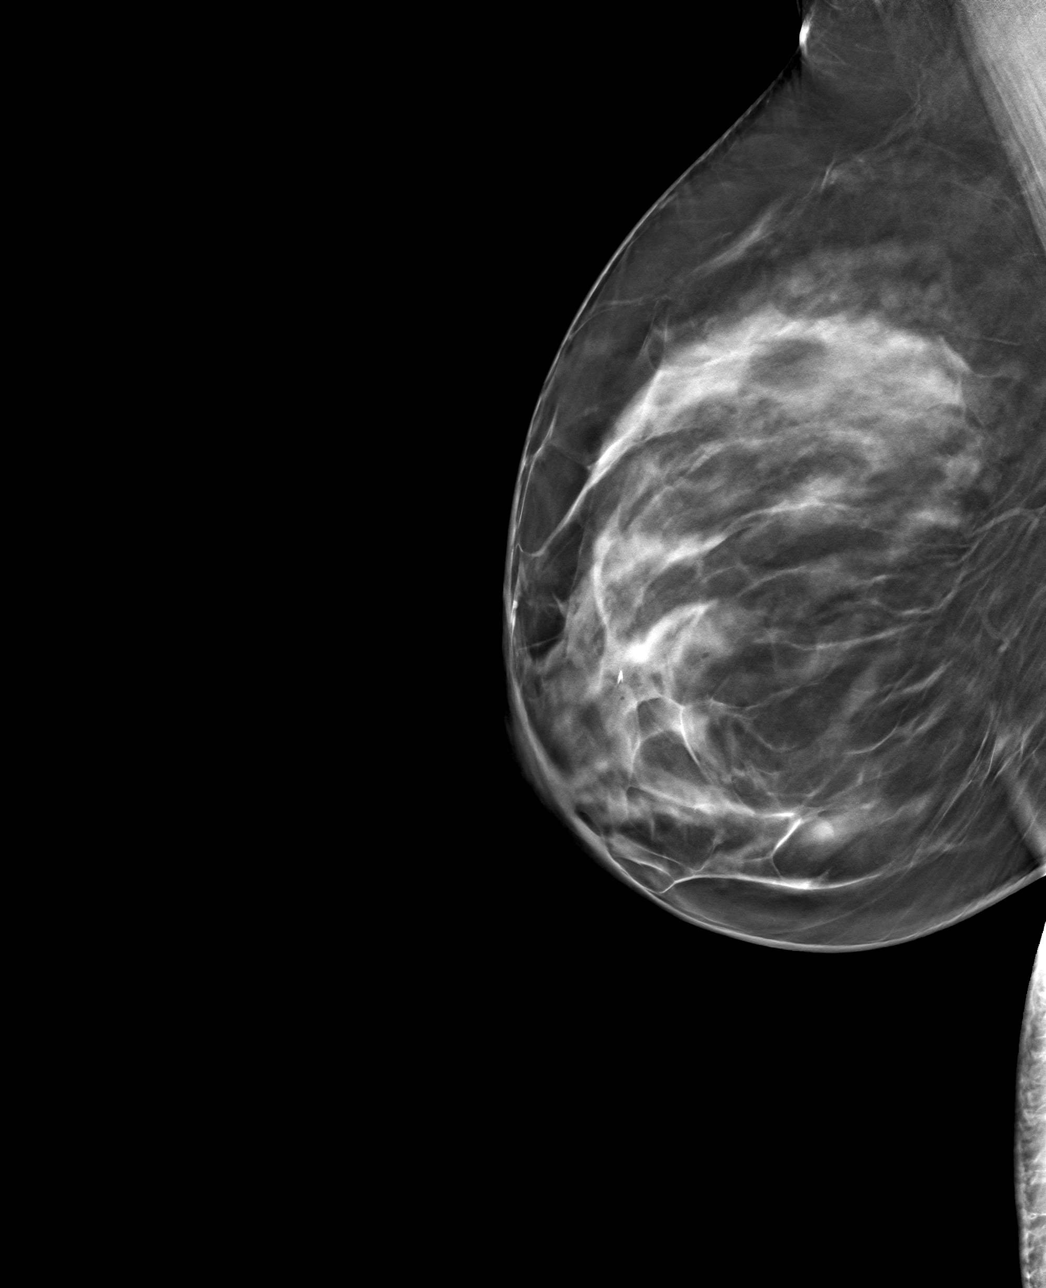

[R CC tomo · tomo slice 39/77.0]
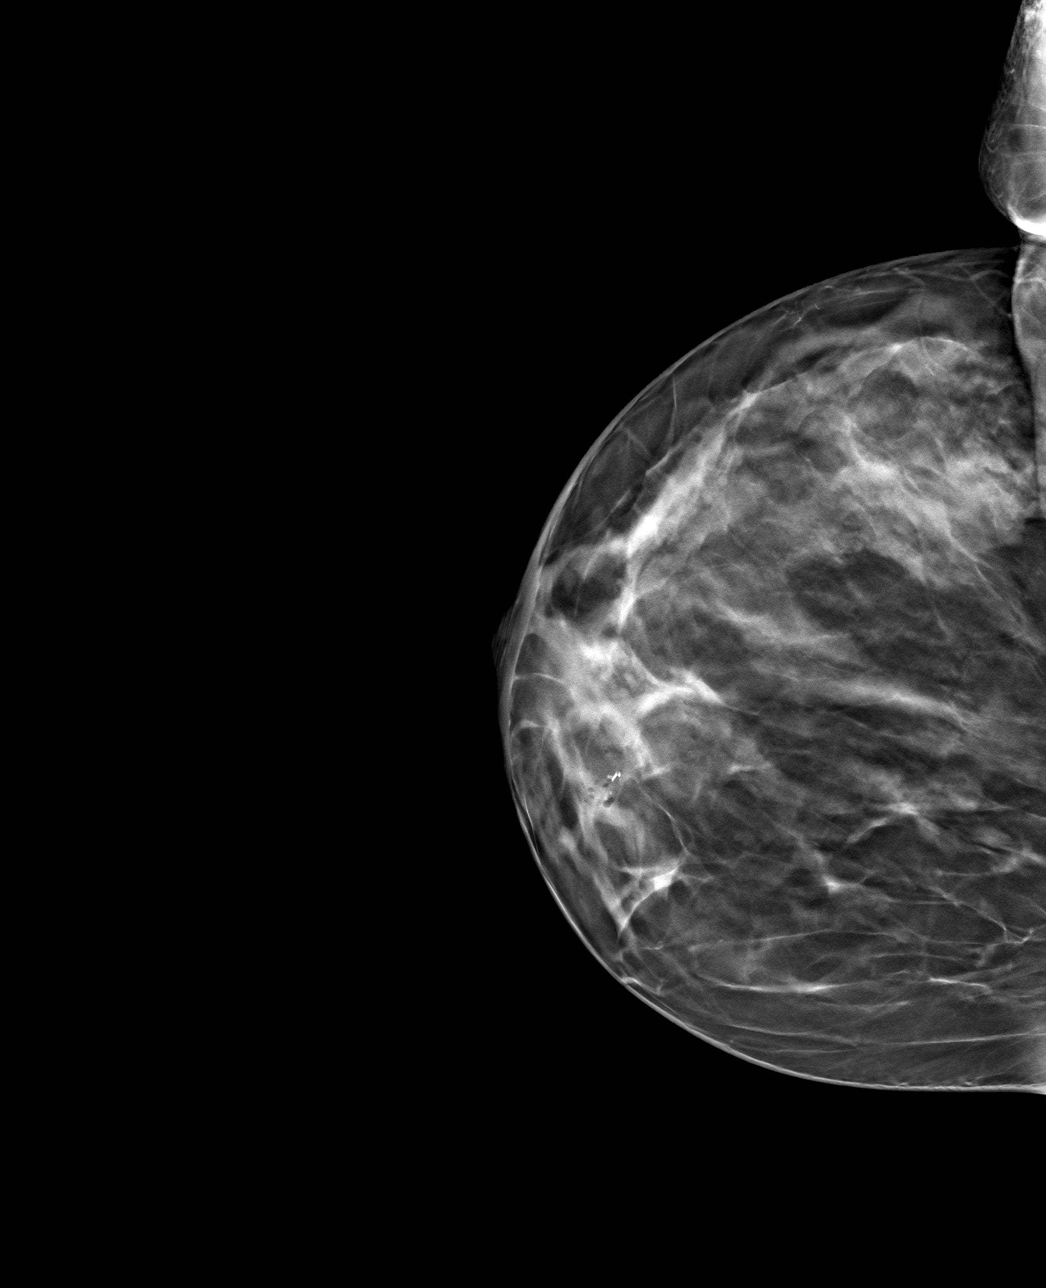

[4 of 12 positions shown; findings below may reference images not displayed]

FINDINGS: 3D Mammographic images were obtained following ultrasound guided
biopsy of a retroareolar right breast mass at 2 o'clock. The biopsy
marking clip is in expected position at the site of biopsy.
IMPRESSION: Appropriate positioning of the ribbon shaped biopsy marking clip at
the site of biopsy in the medial retroareolar right breast.

Final Assessment: Post Procedure Mammograms for Marker Placement

## 2022-01-08 IMAGING — US US  BREAST BX W/ LOC DEV 1ST LESION IMG BX SPEC US GUIDE*R*
1 series · 12 of 13 positions shown · non-contrast
Comparison: Previous exam(s).
COMPARISON: Previous exam(s).

Addendum:
CLINICAL DATA: 39-year-old female presenting for ultrasound-guided
biopsy of a right breast mass.

EXAM:
ULTRASOUND GUIDED RIGHT BREAST CORE NEEDLE BIOPSY

[Series 1: us breast bx w/ loc dev 1st lesion img bx spec us  · 0.06mm/px · 12 of 13 slices shown]
[im 1/13]
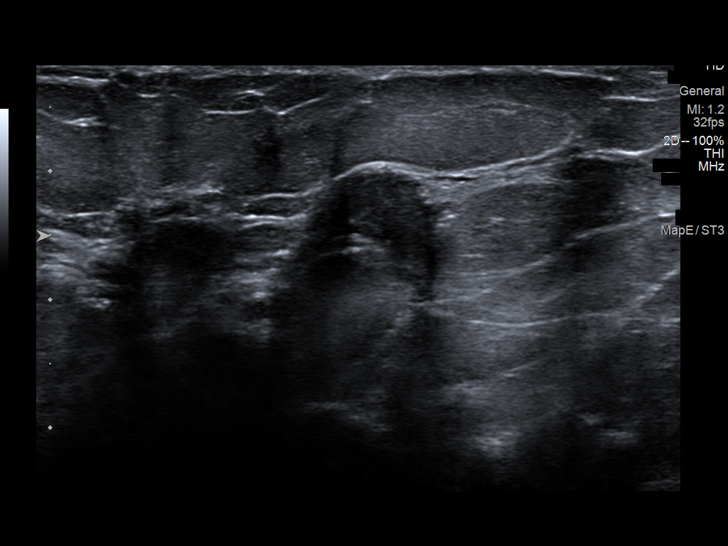
[im 2/13]
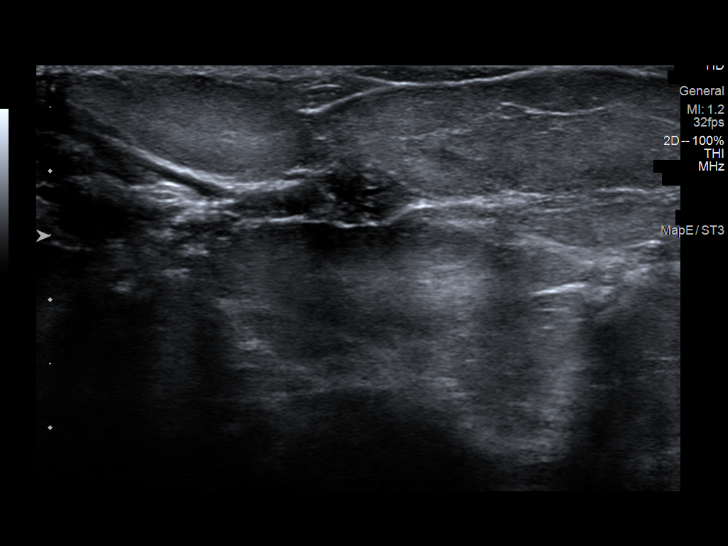
[im 3/13]
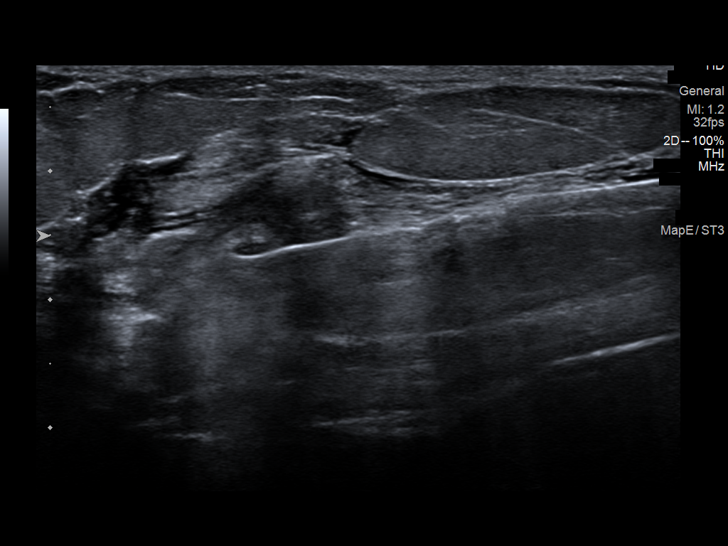
[im 4/13]
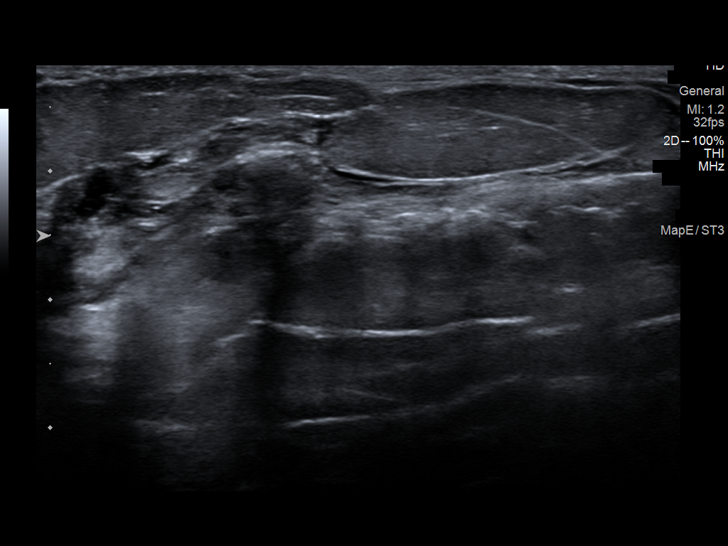
[im 5/13]
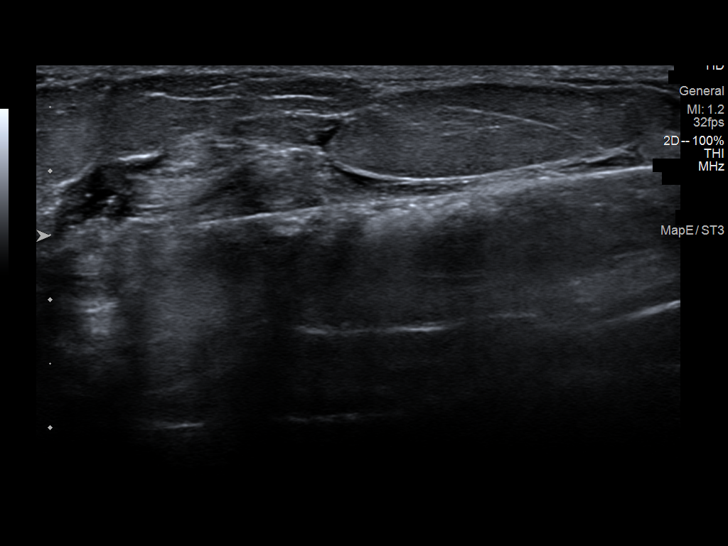
[im 6/13]
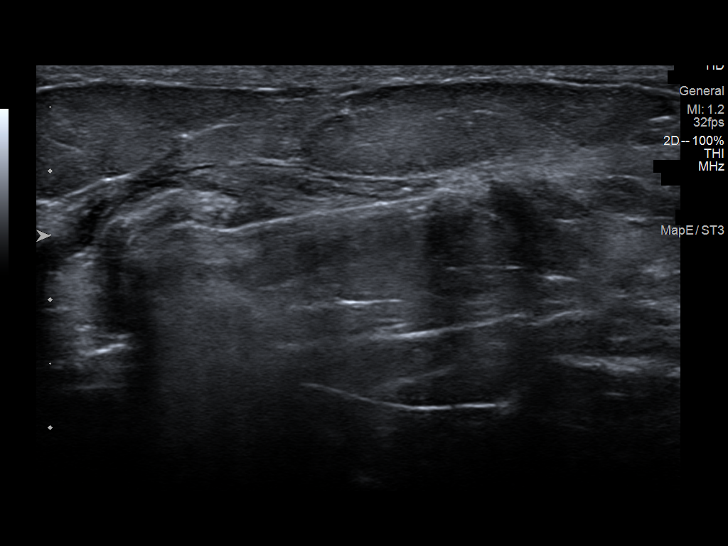
[im 8/13]
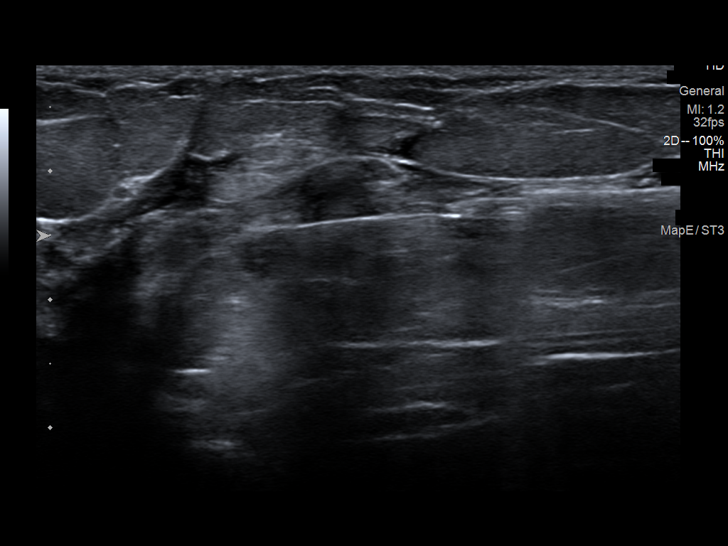
[im 9/13]
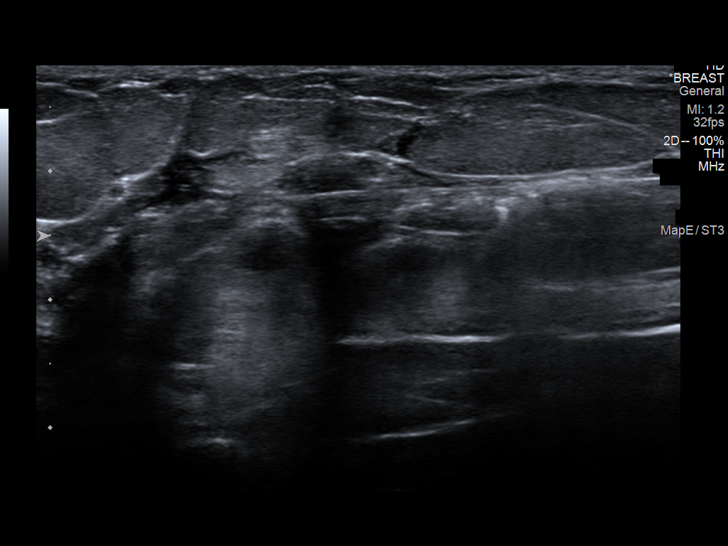
[im 10/13]
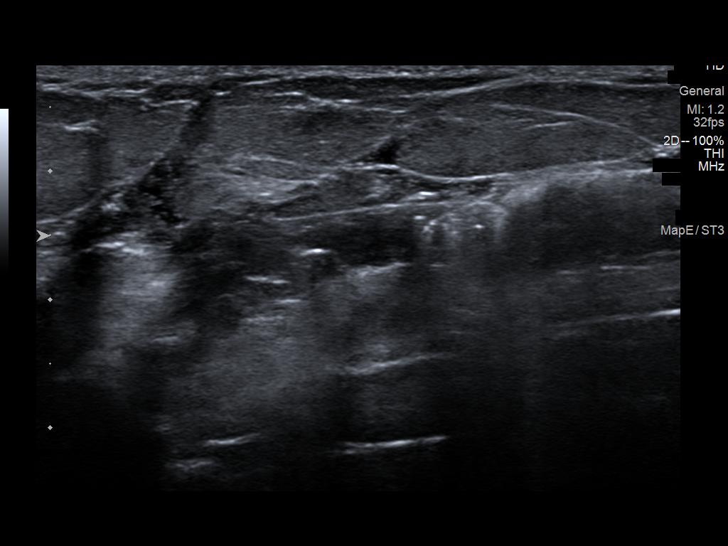
[im 11/13]
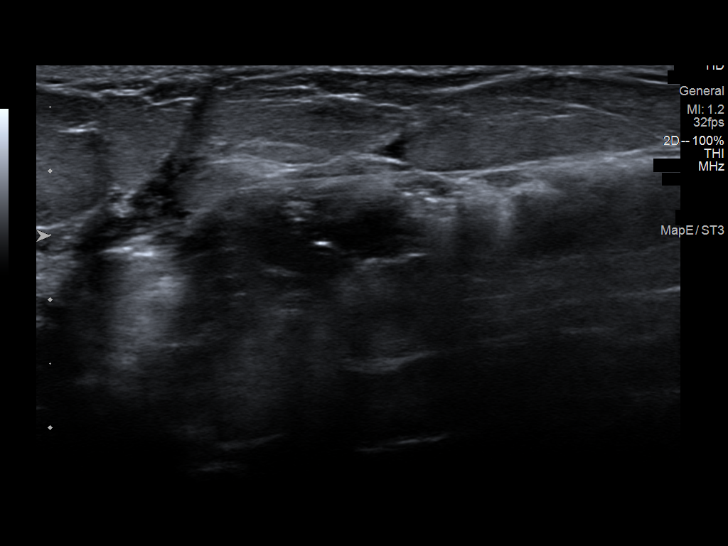
[im 12/13]
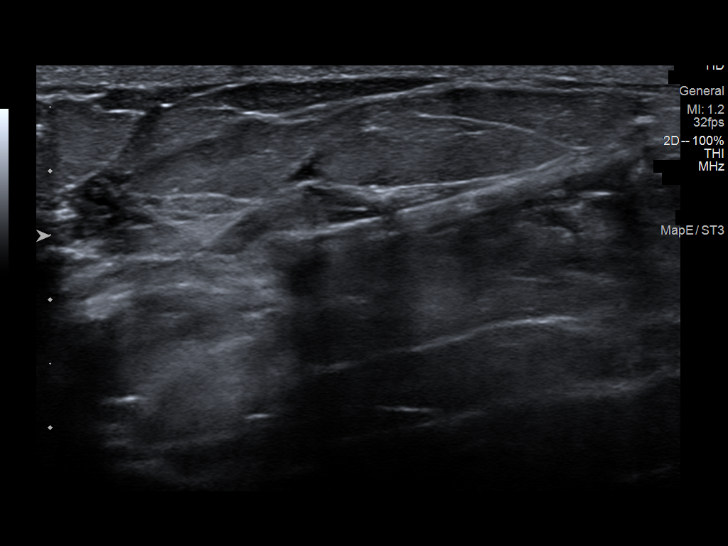
[im 13/13]
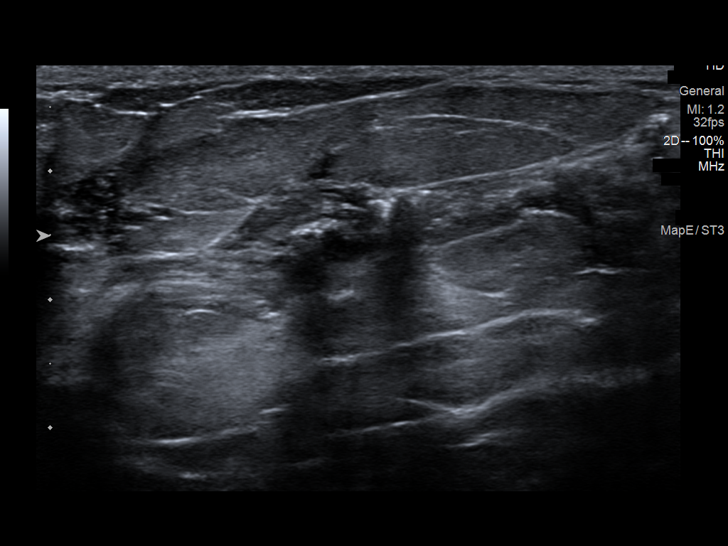

[12 of 13 positions shown; findings below may reference images not displayed]



Lesion quadrant: Upper inner quadrant

Using sterile technique and 1% Lidocaine as local anesthetic, under
direct ultrasound visualization, a 14 gauge Davina device was
used to perform biopsy of a retroareolar right breast mass at 2
o'clock using an inferomedial approach. At the conclusion of the
procedure a ribbon shaped tissue marker clip was deployed into the
biopsy cavity. Follow up 2 view mammogram was performed and dictated
separately.
IMPRESSION: Ultrasound guided biopsy of a retroareolar right breast mass at 2
o'clock. No apparent complications.

ADDENDUM:
Pathology revealed GRADE II INVASIVE DUCTAL CARCINOMA of the Right
breast, retroareolar, 2 o'clock, (ribbon clip). This was found to be
concordant by Dr. Lusine Jim.

Pathology results were discussed with the patient by telephone. The
patient reported doing well after the biopsy with tenderness at the
site. Post biopsy instructions and care were reviewed and questions
were answered. The patient was encouraged to call The [REDACTED] for any additional concerns. My direct phone
number was provided.

Surgical consultation has been arranged with Dr. Chander Rushing, per
patient request, at [REDACTED], [HOSPITAL] location,
on September 18, 2020.

Recommendation for a bilateral breast MRI given heterogeneously
dense breast tissue and age.

Pathology results reported by Lirigzon Acquaviva, RN on 09/13/2020.



Lesion quadrant: Upper inner quadrant

Using sterile technique and 1% Lidocaine as local anesthetic, under
direct ultrasound visualization, a 14 gauge Davina device was
used to perform biopsy of a retroareolar right breast mass at 2
o'clock using an inferomedial approach. At the conclusion of the
procedure a ribbon shaped tissue marker clip was deployed into the
biopsy cavity. Follow up 2 view mammogram was performed and dictated
separately.
IMPRESSION: Ultrasound guided biopsy of a retroareolar right breast mass at 2
o'clock. No apparent complications.

## 2022-02-19 DIAGNOSIS — N3001 Acute cystitis with hematuria: Secondary | ICD-10-CM | POA: Diagnosis not present

## 2022-02-19 DIAGNOSIS — R300 Dysuria: Secondary | ICD-10-CM | POA: Diagnosis not present

## 2022-03-05 ENCOUNTER — Encounter: Payer: Self-pay | Admitting: Neurology

## 2022-03-06 ENCOUNTER — Other Ambulatory Visit: Payer: Self-pay

## 2022-03-06 MED ORDER — BUTALBITAL-APAP-CAFFEINE 50-325-40 MG PO TABS: 1.0000 | ORAL_TABLET | Freq: Three times a day (TID) | ORAL | 0 refills | Status: DC | PRN

## 2022-03-06 MED ORDER — ALMOTRIPTAN MALATE 12.5 MG PO TABS: ORAL_TABLET | ORAL | 0 refills | Status: DC

## 2022-03-06 MED ORDER — EMGALITY 120 MG/ML ~~LOC~~ SOAJ: 120.0000 mg | SUBCUTANEOUS | 0 refills | Status: DC

## 2022-03-11 ENCOUNTER — Telehealth: Payer: Self-pay

## 2022-03-11 DIAGNOSIS — F411 Generalized anxiety disorder: Secondary | ICD-10-CM | POA: Diagnosis not present

## 2022-03-11 DIAGNOSIS — F339 Major depressive disorder, recurrent, unspecified: Secondary | ICD-10-CM | POA: Diagnosis not present

## 2022-03-11 DIAGNOSIS — G4700 Insomnia, unspecified: Secondary | ICD-10-CM | POA: Diagnosis not present

## 2022-03-11 DIAGNOSIS — F410 Panic disorder [episodic paroxysmal anxiety] without agoraphobia: Secondary | ICD-10-CM | POA: Diagnosis not present

## 2022-03-11 NOTE — Telephone Encounter (Signed)
Patient needs PA for emgality and axert

## 2022-03-12 ENCOUNTER — Encounter: Payer: Self-pay | Admitting: Neurology

## 2022-03-12 ENCOUNTER — Other Ambulatory Visit (HOSPITAL_COMMUNITY): Payer: Self-pay

## 2022-03-12 NOTE — Telephone Encounter (Signed)
I have sent PA requests to pharm team yesterday, no update as of now. Last visit was Botox with Dr.Yan in may, 2023. Last office visit was 11.15.21. Has an appointment as of now with Judson Roch on 3.19.24

## 2022-03-12 NOTE — Telephone Encounter (Signed)
Patient Advocate Encounter   Received notification that prior authorization for Almotriptan Malate 12.5MG tablets is required.   PA submitted on 03/12/2022 Key [id number] Status is pending       Lyndel Safe, [place] Patient Advocate Specialist [place] Patient Advocate Team Direct Number: [phone]  Fax: [phone]

## 2022-03-12 NOTE — Telephone Encounter (Signed)
Patient Advocate Encounter   Received notification that prior authorization for Emgality 120MG/ML auto-injectors (migraine) is required.   PA submitted on 03/12/2022 Key [id number] Status is pending       Lyndel Safe, [place] Patient Advocate Specialist [place] Patient Advocate Team Direct Number: [phone]  Fax: [phone]

## 2022-03-14 NOTE — Telephone Encounter (Signed)
Patient Advocate Encounter  Prior Authorization for Almotriptan Malate 12.'5MG'$  tablets has been approved.    PA# [id number]     Lyndel Safe, CPhT Pharmacy Patient Advocate Specialist [place] Patient Advocate Team Direct Number: [phone]  Fax: [phone]

## 2022-03-17 NOTE — Telephone Encounter (Signed)
I added appointment to wait list and will keep an eye out for openings

## 2022-03-20 ENCOUNTER — Encounter: Payer: Self-pay | Admitting: *Deleted

## 2022-03-20 DIAGNOSIS — Z0289 Encounter for other administrative examinations: Secondary | ICD-10-CM

## 2022-03-20 NOTE — Telephone Encounter (Signed)
   We are still awaiting determination.

## 2022-03-23 ENCOUNTER — Other Ambulatory Visit: Payer: Self-pay | Admitting: Neurology

## 2022-03-26 NOTE — Progress Notes (Unsigned)
Patient: Alexandra Warren Date of Birth: Sep 22, 1980  Reason for Visit: Follow up History from: Patient Primary Neurologist: Krista Blue  ASSESSMENT AND PLAN 42 y.o. year old female   Chronic migraine headaches History of right breast cancer  -Given samples of Ajovy (#2) to start for migraine preventative, Emgality is still under review for PA, in retrospect in the past continue to have migraine headaches -Axert is on national backorder, will order Ubrelvy 100 mg as needed for acute headache, given samples today, she has Fioricet as backup -Next steps: Consider restarting Botox, try Qulipta -Previously tried and failed: Topamax, Emgality, beta-blockers, amitriptyline, Cymbalta, nortriptyline, Nurtec, Imitrex -Call for dose adjustment, follow-up in 4 to 6 months or sooner if needed  Ajovy (Lot #[id number], 4/26), [name] [id number], 9/25)  HISTORY  She has migraine BOTOX emaglity doing so well has helped her migraine 3-4 times a month, this is the best, axert and fioriect prn did help her,     [name] is a 42 year-old left-handed Caucasian female referred by her primary care physician PA Delman Cheadle for evaluation of migraine.    She has history of migraines since 2008, approximately age 55 or 32, typical migraine is right parietal retro-orbital area with severe achy pain, sometimes was associated with light sensitivity and noise sensitivity, lasting a couple hours or until able to go to sleep. Responded very well to Axert. Triggers for her migraines are stress, hormonal change, weather change.  She can have up to 20-30 migraines days each month, responding to Axert 75% of the time. A quarter of the time even though she was taking repeated those of Axert, she would suffer prolonged severe protracted migraine that can last a couple of days. She has tried Imitrex in the past which caused neck tightness.    She has been taking Topamax 100 mg every morning, which has been very helpful,  previously tried beta blockers and amitriptyline without help.    She also has a history of depression and generalized anxiety taking Cymbalta 120 mg every day. Her father had history of migraine. She was previous under the care of local neurologist Dr. Silvio Pate who since has left the practice.    MRI of the brain was normal 2009.    She is now taking Topamax '100mg'$  bid and Riboflavin/Magnesium.  Axert is effective    UPDATE July 14th 2015: She only had 2 migraines in past 1 month, now she is less stressful, while she is on summer medication, Axert continues to help, she also complains of excessive fatigue, sleepiness during the daytime, ESS score is 14, FSS is 59,   She denies snoring, has lost few pounds. But she has excessive daytime sleepiness, and fatigue.   UPDATE Mar 08 2018: Last visit was with Chrys Racer in July 2019, since then multiple phone conversation for increased headache, despite polypharmacy treatment, Cymbalta 60 mg daily, nortriptyline 20 mg at bedtime, clonazepam as needed, Trintellix 5 mg daily, she continue complains of 15 headache days each month, lateralized severe pounding headache with associated light noise sensitivity, lasting for hours to days,   She is taking Axert as needed, which helps her most of the time, also seeing psychiatrist for anxiety, chronic insomnia,   UPDATE May 29 2021: Every 3 months Botox injection, and monthly Emgality injection has really helped her migraine, she now has 1-2 migraine every week, responding to Axert that her,   She continues to suffer depression, anxiety, breast cancer treatment, was laid off from her  job, tearful at today's visit  Update March 27, 2022 SS: Last seen in May 2023 for Botox, Emgality with Dr. Krista Blue. She started new insurance with her job then, her insurance denied the Botox. With Botox was having 1-2 migraines a week, now 1 migraine daily. Has Fioricet she has been using. Axert is on Producer, television/film/video. Working in  Press photographer, high pressure job, works in Marketing executive. Finished breast cancer in November chemo, radiation, immunotherapy, port removed. In remission right now. On Lithium from psychiatry.   REVIEW OF SYSTEMS: Out of a complete 14 system review of symptoms, the patient complains only of the following symptoms, and all other reviewed systems are negative.  See HPI  ALLERGIES: Allergies  Allergen Reactions   Antihistamines, Diphenhydramine-Type Other (See Comments)   Diphenhydramine Hcl Other (See Comments)    Restless leg   Promethazine Other (See Comments)    Tingling sensation   Pseudoeph-Doxylamine-Dm-Apap Other (See Comments)    Nyquil- Tingling sensation in legs    HOME MEDICATIONS: Outpatient Medications Prior to Visit  Medication Sig Dispense Refill   almotriptan (AXERT) 12.5 MG tablet Take 1 tab at onset of migraine.  May repeat in 2 hrs, if needed.  Max dose: 2 tabs/day. This is a 30 day prescription. 12 tablet 0   azelastine (ASTELIN) 0.1 % nasal spray Place 1 spray into both nostrils daily as needed for rhinitis.     BOTOX 200 units SOLR Inject 200 Units into the skin See admin instructions. Every 90 days     butalbital-acetaminophen-caffeine (FIORICET) 50-325-40 MG tablet Take 1 tablet by mouth every 8 (eight) hours as needed for headache. 10 tablet 0   clonazePAM (KLONOPIN) 1 MG tablet Take 0.5-1 mg by mouth 2 (two) times daily as needed for anxiety.     EMGALITY 120 MG/ML SOAJ Inject 120 mg into the skin every 30 (thirty) days. 3 mL 0   omeprazole (PRILOSEC) 20 MG capsule Take 20 mg by mouth daily.     ondansetron (ZOFRAN) 8 MG tablet Take 1 tablet (8 mg total) by mouth every 8 (eight) hours as needed for nausea or vomiting. 30 tablet 3   rosuvastatin (CRESTOR) 20 MG tablet Take 20 mg by mouth daily.     tiZANidine (ZANAFLEX) 4 MG tablet Take 1 tablet (4 mg total) by mouth every 8 (eight) hours as needed for muscle spasms (migraines). This is a 30 day rx. 20 tablet 5   topiramate  (TOPAMAX) 100 MG tablet Take 1 tablet (100 mg total) by mouth 2 (two) times daily. 180 tablet 3   traMADol (ULTRAM) 50 MG tablet Take 50 mg by mouth every 6 (six) hours as needed for pain.     vortioxetine HBr (TRINTELLIX) 20 MG TABS tablet Take 20 mg by mouth daily.  0   zolpidem (AMBIEN) 10 MG tablet Take 10 mg by mouth at bedtime as needed for sleep.     benzonatate (TESSALON) 100 MG capsule Take 1 capsule (100 mg total) by mouth 3 (three) times daily as needed for cough. (Patient not taking: Reported on 03/27/2022) 21 capsule 0   celecoxib (CELEBREX) 200 MG capsule Take 200 mg by mouth daily. (Patient not taking: Reported on 03/27/2022)     clindamycin (CLEOCIN T) 1 % external solution Apply topically 2 (two) times daily as needed. (Patient not taking: Reported on 03/27/2022)     lidocaine-prilocaine (EMLA) cream Apply 1 application topically as needed. (Patient not taking: Reported on 03/27/2022) 30 g 4   potassium chloride  SA (KLOR-CON M) 20 MEQ tablet 1 pill twice a day (Patient not taking: Reported on 03/27/2022) 30 tablet 3   prochlorperazine (COMPAZINE) 10 MG tablet Take 1 tablet (10 mg total) by mouth every 6 (six) hours as needed for nausea or vomiting. (Patient not taking: Reported on 03/27/2022) 40 tablet 1   silver sulfADIAZINE (SILVADENE) 1 % cream Apply 1 application. topically 2 (two) times daily. (Patient not taking: Reported on 03/27/2022) 85 g 2   No facility-administered medications prior to visit.    PAST MEDICAL HISTORY: Past Medical History:  Diagnosis Date   Acid reflux    Anxiety and depression    Dr. Gala Murdoch, MD   Back pain    Family history of breast cancer 09/21/2020   Family history of malignant neoplasm of digestive organ 09/21/2020   Fibromyalgia    History of gallstones    Malignant neoplasm of upper-inner quadrant of right female breast ([place]) 09/21/2020   Migraine     PAST SURGICAL HISTORY: Past Surgical History:  Procedure Laterality Date   BREAST LUMPECTOMY  WITH RADIOACTIVE SEED LOCALIZATION Right 10/17/2020   Procedure: RIGHT BREAST LUMPECTOMY WITH RADIOACTIVE SEED LOCALIZATION WITH RIGHT SENTINEL LYMPH NODE BIOPSY;  Surgeon: Stark Klein, MD;  Location: [facility];  Service: General;  Laterality: Right;   CHOLECYSTECTOMY  12/2004   PORTACATH PLACEMENT Left 10/17/2020   Procedure: INSERTION PORT-A-CATH;  Surgeon: Stark Klein, MD;  Location: [facility];  Service: General;  Laterality: Left;    FAMILY HISTORY: Family History  Problem Relation Age of Onset   Healthy Mother    Hypertension Father    Skin cancer Brother        dx before 29   Colon cancer Maternal Aunt 22   Cervical cancer Maternal Aunt        dx before 73; x2   Cancer Paternal Grandmother 99       unknown primary; ? liver   Lung cancer Paternal Grandfather        dx after 39   Breast cancer Cousin 75       maternal cousin   Colon cancer Other        MGM's sister   Gastric cancer Other        PGM's sister   Leukemia Other        PMG's sister   Rectal cancer Neg Hx    Esophageal cancer Neg Hx     SOCIAL HISTORY: Social History   Socioeconomic History   Marital status: Divorced    Spouse name: Corene Cornea   Number of children: 0   Years of education: MS   Highest education level: Not on file  Occupational History   Occupation: Optometrist  Tobacco Use   Smoking status: Never   Smokeless tobacco: Never  Vaping Use   Vaping Use: Never used  Substance and Sexual Activity   Alcohol use: Not Currently    Comment: pt states 1 glass of wine maybe once a month   Drug use: No   Sexual activity: Yes    Birth control/protection: Condom  Other Topics Concern   Not on file  Social History Narrative   Accountant for commercial firm; wants short term disability;  never smoked; 2 drinks a month. Lives in [place] with boy friend [[place] No children. Has one brother    Social Determinants of Radio broadcast assistant Strain: Not on file  Food Insecurity: Not  on file  Transportation Needs: Not  on file  Physical Activity: Not on file  Stress: Not on file  Social Connections: Not on file  Intimate Partner Violence: Not on file    PHYSICAL EXAM  Vitals:   03/27/22 1440  BP: 108/75  Pulse: 80  Weight: 172 lb (78 kg)  Height: '5\' 6"'$  (1.676 m)   Body mass index is 27.76 kg/m.  Generalized: Well developed, in no acute distress, tired, anxious Neurological examination  Mentation: Alert oriented to time, place, history taking. Follows all commands speech and language fluent Cranial nerve II-XII: Pupils were equal round reactive to light. Extraocular movements were full, visual field were full on confrontational test. Facial sensation and strength were normal. Head turning and shoulder shrug  were normal and symmetric. Motor: The motor testing reveals 5 over 5 strength of all 4 extremities. Good symmetric motor tone is noted throughout.  Sensory: Sensory testing is intact to soft touch on all 4 extremities. No evidence of extinction is noted.  Coordination: Cerebellar testing reveals good finger-nose-finger and heel-to-shin bilaterally.  Fine tremor with finger-nose-finger bilaterally. Gait and station: Gait is normal. Reflexes: Deep tendon reflexes are symmetric and normal bilaterally.   DIAGNOSTIC DATA (LABS, IMAGING, TESTING) - I reviewed patient records, labs, notes, testing and imaging myself where available.  Lab Results  Component Value Date   WBC 7.1 04/26/2021   HGB 13.7 04/26/2021   HCT 40.4 04/26/2021   MCV 94.8 04/26/2021   PLT 293 04/26/2021      Component Value Date/Time   NA 134 (L) 04/26/2021 0824   K 3.7 04/26/2021 0824   CL 105 04/26/2021 0824   CO2 21 (L) 04/26/2021 0824   GLUCOSE 106 (H) 04/26/2021 0824   BUN 17 04/26/2021 0824   CREATININE 0.70 04/26/2021 0824   CALCIUM 8.6 (L) 04/26/2021 0824   PROT 6.8 04/26/2021 0824   ALBUMIN 4.1 04/26/2021 0824   AST 24 04/26/2021 0824   ALT 28 04/26/2021 0824    ALKPHOS 56 04/26/2021 0824   BILITOT 0.4 04/26/2021 0824   GFRNONAA >60 04/26/2021 0824   Lab Results  Component Value Date   CHOL 142 12/21/2020   HDL 54 12/21/2020   LDLCALC 73 12/21/2020   TRIG 73 12/21/2020   CHOLHDL 2.6 12/21/2020   No results found for: "HGBA1C" No results found for: "VITAMINB12" No results found for: "TSH"  Butler Denmark, AGNP-C, DNP 03/27/2022, 2:53 PM [facility] [address], [place], [place] [address] [phone]

## 2022-03-27 ENCOUNTER — Ambulatory Visit: Payer: BC Managed Care – PPO | Admitting: Neurology

## 2022-03-27 ENCOUNTER — Encounter: Payer: Self-pay | Admitting: Neurology

## 2022-03-27 ENCOUNTER — Encounter: Payer: Self-pay | Admitting: Internal Medicine

## 2022-03-27 VITALS — BP 108/75 | HR 80 | Ht 66.0 in | Wt 172.0 lb

## 2022-03-27 DIAGNOSIS — G43009 Migraine without aura, not intractable, without status migrainosus: Secondary | ICD-10-CM

## 2022-03-27 MED ORDER — UBRELVY 100 MG PO TABS: 100.0000 mg | ORAL_TABLET | ORAL | 11 refills | Status: DC | PRN

## 2022-03-27 MED ORDER — AJOVY 225 MG/1.5ML ~~LOC~~ SOAJ: 225.0000 mg | SUBCUTANEOUS | 11 refills | Status: DC

## 2022-03-27 NOTE — Patient Instructions (Signed)
Start the [place] for migraine prevention  Use Ubrelvy as needed for acute headache

## 2022-04-02 NOTE — Telephone Encounter (Signed)
BCBS doesn't allow a PT to be on a CGRP along with Botox-Botox is still showing active in chart and [employer]BS has denied the PA for Emgality due to that. Denial letter has been scanned into chart and there is a previous note in chart dated 08/20/21 where the same issue happened at that time as well.

## 2022-04-03 ENCOUNTER — Other Ambulatory Visit (HOSPITAL_COMMUNITY): Payer: Self-pay

## 2022-04-03 NOTE — Addendum Note (Signed)
Addended by: Suzzanne Cloud on: 04/03/2022 12:28 PM   Modules accepted: Orders

## 2022-04-03 NOTE — Telephone Encounter (Signed)
Pharmacy Patient Advocate Encounter   Received notification from [place] that prior authorization for AJOVY (fremanezumab-vfrm) injection '225MG'$ /1.5ML auto-injectors is required/requested.   PA submitted on 04/03/2022 to (ins) [employer] [place] via [employer] or [facility]) confirmation # [id number] Status is pending

## 2022-04-03 NOTE — Telephone Encounter (Signed)
Called to Speak to patient and got VM, I left a detailed message for her per DPR and requested a CB with her thoughts. I will also send a mychart

## 2022-04-07 ENCOUNTER — Other Ambulatory Visit (HOSPITAL_COMMUNITY): Payer: Self-pay

## 2022-04-07 MED ORDER — ALMOTRIPTAN MALATE 12.5 MG PO TABS: ORAL_TABLET | ORAL | 11 refills | Status: DC

## 2022-04-07 NOTE — Telephone Encounter (Signed)
Pharmacy Patient Advocate Encounter  Prior Authorization for AJOVY (fremanezumab-vfrm) injection 225MG /1.5ML auto-injectors has been approved by [facility] [place] [employer] (ins).    PA # PA Case ID #: [id number] Effective dates: 04/04/2022 through 06/26/2022

## 2022-04-07 NOTE — Addendum Note (Signed)
Addended by: Suzzanne Cloud on: 04/07/2022 10:40 AM   Modules accepted: Orders

## 2022-04-08 ENCOUNTER — Ambulatory Visit: Payer: PRIVATE HEALTH INSURANCE | Admitting: Neurology

## 2022-04-08 ENCOUNTER — Encounter: Payer: Self-pay | Admitting: Neurology

## 2022-04-08 NOTE — Telephone Encounter (Signed)
Form given to debra in Medical records completed

## 2022-04-08 NOTE — Telephone Encounter (Signed)
Form completed and placed in NP office for review and signature

## 2022-04-09 NOTE — Telephone Encounter (Signed)
ADA form faxed

## 2022-04-09 NOTE — Telephone Encounter (Signed)
Key: [id number], Case [id number] Completed on covermymeds Looks like it was started but not completed, I have submitted and status is pending.

## 2022-04-22 ENCOUNTER — Other Ambulatory Visit: Payer: Self-pay

## 2022-05-26 ENCOUNTER — Other Ambulatory Visit: Payer: Self-pay | Admitting: Neurology

## 2022-05-27 MED ORDER — BUTALBITAL-APAP-CAFFEINE 50-325-40 MG PO TABS: 1.0000 | ORAL_TABLET | Freq: Three times a day (TID) | ORAL | 0 refills | Status: DC | PRN

## 2022-05-27 NOTE — Telephone Encounter (Signed)
Last office visit 04/16/22, next appointment 08/07/22. Routing to provider to refill ( work in)   Web designer Quantity Provider Pharmacy  BUTALB-ACETAMIN-CAFF [phone] 03/06/2022 30 10 each Glean Salvo, NP [facility] [phone] - G...  BUT/APAP/CAF TAB 10/14/2021 30 10 tablet Levert Feinstein, MD [facility]  BUT/ACETAMINOPHEN/CAFF 50-325-40 TB 10/11/2021 30 10 each Levert Feinstein, MD [facility]  BUT/ACETAMINOPHEN/CAFF 50-325-40 TB 07/05/2021 30 10 each Levert Feinstein, MD [facility]       How do dispenses affect the score?

## 2022-05-31 ENCOUNTER — Other Ambulatory Visit: Payer: Self-pay | Admitting: Neurology

## 2022-06-02 ENCOUNTER — Encounter: Payer: Self-pay | Admitting: Neurology

## 2022-06-02 NOTE — Telephone Encounter (Signed)
HOLD REFILL TO CLARIFY WITH SARAH:

## 2022-06-04 ENCOUNTER — Other Ambulatory Visit (HOSPITAL_COMMUNITY): Payer: Self-pay

## 2022-06-04 ENCOUNTER — Telehealth: Payer: Self-pay

## 2022-06-04 ENCOUNTER — Encounter: Payer: Self-pay | Admitting: Internal Medicine

## 2022-06-04 NOTE — Telephone Encounter (Signed)
Patient Advocate Encounter   Received notification from [facility] [address] [place] that prior authorization is required for Ubrelvy 100MG  tablets   Submitted: 06-04-2022 Key [id number]  Status is pending

## 2022-06-04 NOTE — Telephone Encounter (Signed)
Pharmacy Patient Advocate Encounter  Received notification from [facility] [place] that the request for prior authorization for Ubrelvy 100MG  tablets has been denied due to   .    Please be advised we currently do not have a Pharmacist to review denials, therefore you will need to process appeals accordingly as needed. Thanks for your support at this time.   You may call  or fax , to appeal.  The denial letter has been placed into the chart

## 2022-06-07 ENCOUNTER — Encounter: Payer: Self-pay | Admitting: Neurology

## 2022-06-09 ENCOUNTER — Other Ambulatory Visit (HOSPITAL_COMMUNITY): Payer: Self-pay

## 2022-06-09 ENCOUNTER — Telehealth: Payer: Self-pay

## 2022-06-09 MED ORDER — TIZANIDINE HCL 4 MG PO TABS: 4.0000 mg | ORAL_TABLET | Freq: Three times a day (TID) | ORAL | 5 refills | Status: DC | PRN

## 2022-06-09 MED ORDER — ONDANSETRON HCL 4 MG PO TABS: 4.0000 mg | ORAL_TABLET | Freq: Three times a day (TID) | ORAL | 1 refills | Status: DC | PRN

## 2022-06-09 NOTE — Addendum Note (Signed)
Addended by: Glean Salvo on: 06/09/2022 12:42 PM   Modules accepted: Orders

## 2022-06-09 NOTE — Telephone Encounter (Signed)
Pharmacy Patient Advocate Encounter  Prior Authorization for Almotriptan Malate 12.5MG  tablets has been approved by [employer] [facility] (ins).    PA # PA Case ID: [id number] Effective dates: 06/09/2022 through 06/09/2023 Key: [id number] Copay is $4.00 per test claim.

## 2022-06-10 NOTE — Telephone Encounter (Signed)
I tried to redo the pa but get a hard stop at has she tried at least 2 triptans. The only one I saw was almotriptan in her chart I could be wrong:  Routing to np slack to make her aware. I tried to re complete. If I send it in with this answered it will just be denied again for the ubrelvy.

## 2022-06-10 NOTE — Telephone Encounter (Signed)
Submitted new pa and will await determination:

## 2022-06-11 ENCOUNTER — Other Ambulatory Visit (HOSPITAL_COMMUNITY): Payer: Self-pay

## 2022-06-11 ENCOUNTER — Other Ambulatory Visit: Payer: Self-pay

## 2022-06-11 MED ORDER — UBRELVY 100 MG PO TABS: 100.0000 mg | ORAL_TABLET | ORAL | 11 refills | Status: DC | PRN

## 2022-06-11 NOTE — Addendum Note (Signed)
Addended by: Eather Colas E on: 06/11/2022 10:14 AM   Modules accepted: Orders

## 2022-07-31 ENCOUNTER — Encounter: Payer: Self-pay | Admitting: Internal Medicine

## 2022-08-07 ENCOUNTER — Telehealth: Payer: Self-pay | Admitting: Neurology

## 2022-08-07 ENCOUNTER — Telehealth (INDEPENDENT_AMBULATORY_CARE_PROVIDER_SITE_OTHER): Payer: Medicaid Other | Admitting: Neurology

## 2022-08-07 DIAGNOSIS — G43009 Migraine without aura, not intractable, without status migrainosus: Secondary | ICD-10-CM | POA: Diagnosis not present

## 2022-08-07 DIAGNOSIS — Z853 Personal history of malignant neoplasm of breast: Secondary | ICD-10-CM

## 2022-08-07 MED ORDER — AJOVY 225 MG/1.5ML ~~LOC~~ SOAJ: 225.0000 mg | SUBCUTANEOUS | 11 refills | Status: DC

## 2022-08-07 NOTE — Telephone Encounter (Signed)
Needs Botox auth. New start.   Chronic Migraine CPT 64615    Botox [id number] Units:200   Migraine without aura and without status migrainosus, not intractable    G43.009

## 2022-08-07 NOTE — Patient Instructions (Signed)
We will restart Botox.  Continue other medications for now.  See you soon.  Hope you feel better.  Thanks!!

## 2022-08-07 NOTE — Telephone Encounter (Signed)
..   Pt understands that although there may be some limitations with this type of visit, we will take all precautions to reduce any security or privacy concerns.  Pt understands that this will be treated like an in office visit and we will file with pt's insurance, and there may be a patient responsible charge related to this service. ? ?

## 2022-08-07 NOTE — Progress Notes (Signed)
Virtual Visit via Video Note  I connected with Alexandra Warren on 08/07/22 at  2:15 PM EDT by a video enabled telemedicine application and verified that I am speaking with the correct person using two identifiers.  Location: Patient: at her home Provider: in the office    I discussed the limitations of evaluation and management by telemedicine and the availability of in person appointments. The patient expressed understanding and agreed to proceed.  History of Present Illness:  HISTORY  She has migraine BOTOX emaglity doing so well has helped her migraine 3-4 times a month, this is the best, axert and fioriect prn did help her,     Alexandra Warren is a 42 year-old left-handed Caucasian female referred by her primary care physician PA Terie Purser for evaluation of migraine.    She has history of migraines since 2008, approximately age 18 or 25, typical migraine is right parietal retro-orbital area with severe achy pain, sometimes was associated with light sensitivity and noise sensitivity, lasting a couple hours or until able to go to sleep. Responded very well to Axert. Triggers for her migraines are stress, hormonal change, weather change.  She can have up to 20-30 migraines days each month, responding to Axert 75% of the time. A quarter of the time even though she was taking repeated those of Axert, she would suffer prolonged severe protracted migraine that can last a couple of days. She has tried Imitrex in the past which caused neck tightness.    She has been taking Topamax 100 mg every morning, which has been very helpful, previously tried beta blockers and amitriptyline without help.    She also has a history of depression and generalized anxiety taking Cymbalta 120 mg every day. Her father had history of migraine. She was previous under the care of local neurologist Dr. Lalla Brothers who since has left the practice.    MRI of the brain was normal 2009.    She is now taking  Topamax 100mg  bid and Riboflavin/Magnesium.  Axert is effective    UPDATE July 14th 2015: She only had 2 migraines in past 1 month, now she is less stressful, while she is on summer medication, Axert continues to help, she also complains of excessive fatigue, sleepiness during the daytime, ESS score is 14, FSS is 59,   She denies snoring, has lost few pounds. But she has excessive daytime sleepiness, and fatigue.   UPDATE Mar 08 2018: Last visit was with [place] Citizen in July 2019, since then multiple phone conversation for increased headache, despite polypharmacy treatment, Cymbalta 60 mg daily, nortriptyline 20 mg at bedtime, clonazepam as needed, Trintellix 5 mg daily, she continue complains of 15 headache days each month, lateralized severe pounding headache with associated light noise sensitivity, lasting for hours to days,   She is taking Axert as needed, which helps her most of the time, also seeing psychiatrist for anxiety, chronic insomnia,   UPDATE May 29 2021: Every 3 months Botox injection, and monthly Emgality injection has really helped her migraine, she now has 1-2 migraine every week, responding to Axert that her,   She continues to suffer depression, anxiety, breast cancer treatment, was laid off from her job, tearful at today's visit  Update March 27, 2022 SS: Last seen in May 2023 for Botox, Emgality with Dr. Terrace Arabia. She started new insurance with her job then, her insurance denied the Botox. With Botox was having 1-2 migraines a week, now 1 migraine daily. Has Fioricet she  has been using. 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 Working in Audiological scientist, high pressure job, works in Paramedic. Finished breast cancer in November chemo, radiation, immunotherapy, port removed. In remission right now. On Lithium from psychiatry.   Update August 07, 2022 SS: Here for VV, got migraine today, vomiting didn't feel good driving. Has been taking Ajovy, down to 10-15 migraines, but they are debilitating, she has no  quality of life, she is in the bed, cannot work, vomiting. Last injection was June 27, 2022. Insurance approved Freight forwarder, uses Fioricet sparingly. She quit her job, migraines and other psychological issues. Still on Topamax 100 mg BID, not sure it really helps her. In the past has been the best on Botox. Is cannot function with her migraines.   Observations/Objective: Via video visit, is alert and oriented, speech is clear and concise, facial symmetry noted, moves about freely  Assessment and Plan: Chronic migraine headaches History of right breast cancer  -Continued migraine headaches 10-15 a month, are debilitating, vomiting, in the bed, lost her job -Restart Botox for migraine prevention, continue Ajovy for migraine prevention, for now continue Topamax 100 mg twice daily -Has several rescue medications to utilize: Axert, Fioricet, Bernita Raisin she is very careful not to overuse and prevent rebound headache -Previously tried and failed: Topamax, Emgality, beta-blockers, amitriptyline, Cymbalta, nortriptyline, Nurtec, Imitrex -I will see her soon for Botox  Follow Up Instructions: For Botox   I discussed the assessment and treatment plan with the patient. The patient was provided an opportunity to ask questions and all were answered. The patient agreed with the plan and demonstrated an understanding of the instructions.   The patient was advised to call back or seek an in-person evaluation if the symptoms worsen or if the condition fails to improve as anticipated.   Otila Kluver, DNP  [facility] [address] [place], [place] [address] [phone]

## 2022-08-07 NOTE — Telephone Encounter (Signed)
Can we work on starting Botox for chronic migraine headaches?  Please see video visit note for frequency and tried and failed.  Thanks

## 2022-08-11 ENCOUNTER — Encounter: Payer: Self-pay | Admitting: Internal Medicine

## 2022-08-12 NOTE — Telephone Encounter (Signed)
Submitted benefit verification: [id number], will update once results are received.

## 2022-08-15 ENCOUNTER — Other Ambulatory Visit (HOSPITAL_COMMUNITY): Payer: Self-pay

## 2022-08-15 ENCOUNTER — Telehealth: Payer: Self-pay

## 2022-08-15 NOTE — Telephone Encounter (Signed)
Pharmacy Patient Advocate Encounter   Received notification from Patient Pharmacy that prior authorization for AJOVY (fremanezumab-vfrm) injection 225MG /1.5ML auto-injectors is required/requested.   Insurance verification completed.   The patient is insured through [facility] .   Per test claim: PA required; PA submitted to [facility] via CoverMyMeds Key/confirmation #/EOC [id number]  Status is pending

## 2022-08-15 NOTE — Telephone Encounter (Signed)
Pharmacy Patient Advocate Encounter  Received notification from [facility] that Prior Authorization for AJOVY (fremanezumab-vfrm) injection 225MG /1.5ML auto-injectors has been APPROVED from 08/15/2022 to 08/15/2023. Ran test claim, Copay is $Unable to very copay at this time.  PA #/Case ID/Reference #: PA Case ID: [id number]

## 2022-08-18 ENCOUNTER — Encounter: Payer: Self-pay | Admitting: Neurology

## 2022-08-18 NOTE — Telephone Encounter (Signed)
Completed prior auth form and placed in nurse pod for NP signature.

## 2022-08-25 NOTE — Telephone Encounter (Signed)
Signed form along with OV notes faxed to [facility] @ [phone].

## 2022-08-27 ENCOUNTER — Telehealth: Payer: Self-pay

## 2022-08-27 NOTE — Telephone Encounter (Signed)
Received a PA approval for Botox-Not sure who submitted the PA-did not see nay notes in the chart about Botox PA except to initiate a PA-Approval letter has been placed into chart under the media tab.

## 2022-08-27 NOTE — Telephone Encounter (Signed)
Prior auth not needed for code 16109 (ref# [id number]). Code U0454 was approved ref #: [id number] (08/25/22-02/21/23).  Please send rx to [facility] when you have a chance, thank you.

## 2022-08-27 NOTE — Telephone Encounter (Signed)
Notes regarding PA are in 7/18 Botox encounter, will update with approval.

## 2022-08-29 ENCOUNTER — Other Ambulatory Visit: Payer: Self-pay

## 2022-09-02 MED ORDER — ONABOTULINUMTOXINA 200 UNITS IJ SOLR: 200.0000 [IU] | INTRAMUSCULAR | 2 refills | Status: DC

## 2022-09-02 MED ORDER — ONABOTULINUMTOXINA 200 UNITS IJ SOLR: 200.0000 [IU] | INTRAMUSCULAR | 3 refills | Status: DC

## 2022-09-02 NOTE — Telephone Encounter (Signed)
Please send Botox rx to [facility], thank you!

## 2022-09-02 NOTE — Telephone Encounter (Signed)
Sent rx.

## 2022-09-02 NOTE — Telephone Encounter (Signed)
Medication Botox no tin medication list. Will reach out to Margie [facility]ge for orders.

## 2022-09-02 NOTE — Addendum Note (Signed)
Addended by: Danne Harbor on: 09/02/2022 04:23 PM   Modules accepted: Orders

## 2022-09-02 NOTE — Addendum Note (Signed)
Addended by: Danne Harbor on: 09/02/2022 04:21 PM   Modules accepted: Orders

## 2022-10-01 MED ORDER — ONABOTULINUMTOXINA 200 UNITS IJ SOLR: 200.0000 [IU] | INTRAMUSCULAR | 3 refills | Status: DC

## 2022-10-01 NOTE — Telephone Encounter (Signed)
Can you please try resending to [facility] in [place]? Let me know if it doesn't go through, thank you!

## 2022-10-01 NOTE — Addendum Note (Signed)
Addended by: Eather Colas E on: 10/01/2022 02:03 PM   Modules accepted: Orders

## 2022-10-02 NOTE — Telephone Encounter (Signed)
I called [facility] and relayed information. They will work on getting benefits verified and reach back out when they're ready to schedule, provided my direct line.

## 2022-10-02 NOTE — Telephone Encounter (Signed)
[facility] ([place]) request diagnostic code for Botox. Also medical insurance, looks like pharmacy benefits is not covering. Would like a call back. Contact [phone]

## 2022-10-08 NOTE — Telephone Encounter (Signed)
I called [facility] back to see if delivery had been set up, spoke with Demetria. She states insurance information was not put in. I relayed information again and was told to check back in a few days.

## 2022-10-15 ENCOUNTER — Ambulatory Visit: Payer: Medicaid Other | Admitting: Neurology

## 2022-10-16 ENCOUNTER — Other Ambulatory Visit: Payer: Self-pay

## 2022-10-17 NOTE — Telephone Encounter (Signed)
Called [facility] to check status of order, they informed me that with pt's insurance they are out of network. [employer] states we will need to buy/bill.

## 2022-10-20 ENCOUNTER — Ambulatory Visit: Payer: Medicaid Other | Admitting: Neurology

## 2022-10-20 NOTE — Progress Notes (Deleted)
Botox- 200 units x 1 vial Lot: [id number] Expiration: 06.2026 NDC: 0023-3921-02  Bacteriostatic 0.9% Sodium Chloride- 4 mL  Lot: [id number] Expiration: 04.01.2025 NDC: 6213086578  Dx: G11.009 B/B Witnessed by April, RN

## 2022-10-21 ENCOUNTER — Other Ambulatory Visit: Payer: Self-pay

## 2022-10-29 ENCOUNTER — Other Ambulatory Visit: Payer: Self-pay | Admitting: Neurology

## 2022-11-01 ENCOUNTER — Telehealth: Payer: Medicaid Other | Admitting: Family Medicine

## 2022-11-01 ENCOUNTER — Encounter: Payer: Self-pay | Admitting: Internal Medicine

## 2022-11-01 DIAGNOSIS — M5489 Other dorsalgia: Secondary | ICD-10-CM

## 2022-11-01 MED ORDER — LIDOCAINE 5 % EX PTCH
1.0000 | MEDICATED_PATCH | CUTANEOUS | 0 refills | Status: AC
Start: 2022-11-01 — End: 2022-11-06

## 2022-11-01 NOTE — Progress Notes (Signed)
E-Visit for Back Pain   We are sorry that you are not feeling well.  Here is how we plan to help!  Based on what you have shared with me it looks like you mostly have acute back pain.  Acute back pain is defined as musculoskeletal pain that can resolve in 1-3 weeks with conservative treatment.  I have prescribed Lidocaine patch. It  looks like you are already on a muscle relaxer.  We only prescribe medications for a short period of time.  If you are needing more muscle relaxers than you currently have, you may have to follow up with you PCP or urgent care if back symptoms become more severe.    Please keep in mind that muscle relaxer's can cause fatigue and should not be taken while at work or driving.  Back pain is very common.  The pain often gets better over time.  The cause of back pain is usually not dangerous.  Most people can learn to manage their back pain on their own.  Home Care Stay active.  Start with short walks on flat ground if you can.  Try to walk farther each day. Do not sit, drive or stand in one place for more than 30 minutes.  Do not stay in bed. Do not avoid exercise or work.  Activity can help your back heal faster. Be careful when you bend or lift an object.  Bend at your knees, keep the object close to you, and do not twist. Sleep on a firm mattress.  Lie on your side, and bend your knees.  If you lie on your back, put a pillow under your knees. Only take medicines as told by your doctor. Put ice on the injured area. Put ice in a plastic bag Place a towel between your skin and the bag Leave the ice on for 15-20 minutes, 3-4 times a day for the first 2-3 days. 210 After that, you can switch between ice and heat packs. Ask your doctor about back exercises or massage. Avoid feeling anxious or stressed.  Find good ways to deal with stress, such as exercise.  Get Help Right Way If: Your pain does not go away with rest or medicine. Your pain does not go away in 1  week. You have new problems. You do not feel well. The pain spreads into your legs. You cannot control when you poop (bowel movement) or pee (urinate) You feel sick to your stomach (nauseous) or throw up (vomit) You have belly (abdominal) pain. You feel like you may pass out (faint). If you develop a fever.  Make Sure you: Understand these instructions. Will watch your condition Will get help right away if you are not doing well or get worse.  Your e-visit answers were reviewed by a board certified advanced clinical practitioner to complete your personal care plan.  Depending on the condition, your plan could have included both over the counter or prescription medications.  If there is a problem please reply  once you have received a response from your provider.  Your safety is important to [place].  If you have drug allergies check your prescription carefully.    You can use MyChart to ask questions about today's visit, request a non-urgent call back, or ask for a work or school excuse for 24 hours related to this e-Visit. If it has been greater than 24 hours you will need to follow up with your provider, or enter a new e-Visit to address those  concerns.  You will get an e-mail in the next two days asking about your experience.  I hope that your e-visit has been valuable and will speed your recovery. Thank you for using e-visits.  I have spent 5 minutes in review of e-visit questionnaire, review and updating patient chart, medical decision making and response to patient.   Reed Pandy, PA-C

## 2022-12-23 ENCOUNTER — Ambulatory Visit: Payer: Medicaid Other | Admitting: Neurology

## 2022-12-23 ENCOUNTER — Encounter: Payer: Self-pay | Admitting: Neurology

## 2022-12-23 VITALS — BP 116/78

## 2022-12-23 DIAGNOSIS — G43009 Migraine without aura, not intractable, without status migrainosus: Secondary | ICD-10-CM

## 2022-12-23 MED ORDER — ONABOTULINUMTOXINA 200 UNITS IJ SOLR
155.0000 [IU] | Freq: Once | INTRAMUSCULAR | Status: AC
Start: 2022-12-23 — End: 2022-12-23
  Administered 2022-12-23: 155 [IU] via INTRAMUSCULAR

## 2022-12-23 NOTE — Progress Notes (Signed)
Botox- 200 units x 1 vial Lot: [id number] Expiration: 2026/09 NDC: 0023-3921-02 Bacteriostatic 0.9% Sodium Chloride- 4mL  Lot: [id number] Expiration: 04/21/23 NDC: 1308-6578-46 Dx:  Migraine without aura and without status migrainosus, not intractable  - Primary G43.009   B/B Witnessed by Karma Lew RN

## 2022-12-23 NOTE — Progress Notes (Signed)
   BOTOX PROCEDURE NOTE FOR MIGRAINE HEADACHE  HISTORY:  Here today for Botox, this is her 1st cycle restarting Botox. Remains on Ajovy. Has stopped taking Topamax. Migraines are 8-10 a month, can last 1-2 days. Has multiple rescue medications, Axert, Fioricet Ubrelvy.  Frustrated.  She remains out of work.  Is considering applying for disability.  Struggling with fibromyalgia, resistant depression.  Description of procedure:  The patient was placed in a sitting position. The standard protocol was used for Botox as follows, with 5 units of Botox injected at each site:   -Procerus muscle, midline injection  -Corrugator muscle, bilateral injection  -Frontalis muscle, bilateral injection, with 2 sites each side, medial injection was performed in the upper one third of the frontalis muscle, in the region vertical from the medial inferior edge of the superior orbital rim. The lateral injection was again in the upper one third of the forehead vertically above the lateral limbus of the cornea, 1.5 cm lateral to the medial injection site.  -Temporalis muscle injection, 4 sites, bilaterally. The first injection was 3 cm above the tragus of the ear, second injection site was 1.5 cm to 3 cm up from the first injection site in line with the tragus of the ear. The third injection site was 1.5-3 cm forward between the first 2 injection sites. The fourth injection site was 1.5 cm posterior to the second injection site.  -Occipitalis muscle injection, 3 sites, bilaterally. The first injection was done one half way between the occipital protuberance and the tip of the mastoid process behind the ear. The second injection site was done lateral and superior to the first, 1 fingerbreadth from the first injection. The third injection site was 1 fingerbreadth superiorly and medially from the first injection site.  -Cervical paraspinal muscle injection, 2 sites, bilateral, the first injection site was 1 cm from the  midline of the cervical spine, 3 cm inferior to the lower border of the occipital protuberance. The second injection site was 1.5 cm superiorly and laterally to the first injection site.  -Trapezius muscle injection was performed at 3 sites, bilaterally. The first injection site was in the upper trapezius muscle halfway between the inflection point of the neck, and the acromion. The second injection site was one half way between the acromion and the first injection site. The third injection was done between the first injection site and the inflection point of the neck.   A 200 unit bottle of Botox was used, 155 units were injected, the rest of the Botox was wasted. The patient tolerated the procedure well, there were no complications of the above procedure.  Botox NDC 4696-2952-84 Lot number [id number] Expiration date 09/2024 BB

## 2022-12-24 ENCOUNTER — Other Ambulatory Visit: Payer: Self-pay

## 2023-01-01 ENCOUNTER — Other Ambulatory Visit: Payer: Self-pay | Admitting: Neurology

## 2023-01-01 NOTE — Telephone Encounter (Signed)
Meds ordered this encounter  Medications   butalbital-acetaminophen-caffeine (FIORICET) 50-325-40 MG tablet    Sig: TAKE 1 TABLET BY MOUTH UP TO EVERY 8 HOURS AS NEEDED FOR HEADACHE.    Dispense:  10 tablet    Refill:  5    Not to exceed 4 additional fills before 04/27/2023

## 2023-01-01 NOTE — Telephone Encounter (Signed)
Requested Prescriptions   Pending Prescriptions Disp Refills   butalbital-acetaminophen-caffeine (FIORICET) 50-325-40 MG tablet [Pharmacy Med Name: BUTALB-ACETAMIN-CAFF 50-325-40] 10 tablet 0    Sig: TAKE 1 TABLET BY MOUTH UP TO EVERY 8 HOURS AS NEEDED FOR HEADACHE.   Last seen 12/23/22 Next appt 03/31/23  Dispenses   Dispensed Days Supply Quantity Provider Pharmacy  BUTALB-ACETAMIN-CAFF 16-109-60 10/29/2022 4 10 each Glean Salvo, NP [facility] [phone] - [facility]...  BUTALB-ACETAMIN-CAFF 98-119-14 06/10/2022 3 10 each Levert Feinstein, MD [facility] [phone] - [facility]...  BUTALB-ACETAMIN-CAFF 56-213-08 03/06/2022 30 10 each Glean Salvo, NP [facility] [phone] - [facility]...      Routing to provider

## 2023-01-12 ENCOUNTER — Encounter: Payer: Self-pay | Admitting: Physical Medicine and Rehabilitation

## 2023-01-22 ENCOUNTER — Ambulatory Visit: Payer: Medicaid Other | Admitting: Neurology

## 2023-01-29 ENCOUNTER — Encounter: Payer: Medicaid Other | Admitting: Physical Medicine and Rehabilitation

## 2023-01-29 ENCOUNTER — Encounter: Payer: Self-pay | Admitting: Physical Medicine and Rehabilitation

## 2023-03-03 ENCOUNTER — Telehealth: Payer: Self-pay | Admitting: Neurology

## 2023-03-03 NOTE — Telephone Encounter (Signed)
Completed [employer] PA form and placed in nurse pod for NP signature.

## 2023-03-04 NOTE — Telephone Encounter (Signed)
Faxed [employer] PA form with OV notes to [phone].

## 2023-03-13 ENCOUNTER — Telehealth: Payer: Medicaid Other | Admitting: Family Medicine

## 2023-03-13 DIAGNOSIS — N390 Urinary tract infection, site not specified: Secondary | ICD-10-CM | POA: Diagnosis not present

## 2023-03-13 MED ORDER — CEPHALEXIN 500 MG PO CAPS: 500.0000 mg | ORAL_CAPSULE | Freq: Two times a day (BID) | ORAL | 0 refills | Status: AC

## 2023-03-13 NOTE — Progress Notes (Signed)

## 2023-03-16 NOTE — Telephone Encounter (Signed)
 Alexandra Warren was denied, [employer] did not show pt has 15 headache days a month, most recent note states 8-10. I can submit appeal with 07/2022 note that states 10/15. Placed appeal form in nurse pod for NP signature.

## 2023-03-17 ENCOUNTER — Ambulatory Visit: Payer: Medicaid Other | Admitting: Neurology

## 2023-03-17 ENCOUNTER — Encounter: Payer: Self-pay | Admitting: Neurology

## 2023-03-18 NOTE — Telephone Encounter (Addendum)
 Received call from [facility] that auth was approved, I should receive an approval fax as well.  Auth#: [id number] (03/04/23-09/01/23)

## 2023-03-18 NOTE — Telephone Encounter (Signed)
 Faxed appeal form to [facility] @ [phone].

## 2023-03-27 ENCOUNTER — Encounter: Payer: Self-pay | Admitting: Neurology

## 2023-03-31 ENCOUNTER — Ambulatory Visit (INDEPENDENT_AMBULATORY_CARE_PROVIDER_SITE_OTHER): Payer: Medicaid Other | Admitting: Neurology

## 2023-03-31 DIAGNOSIS — G43709 Chronic migraine without aura, not intractable, without status migrainosus: Secondary | ICD-10-CM

## 2023-03-31 DIAGNOSIS — G43009 Migraine without aura, not intractable, without status migrainosus: Secondary | ICD-10-CM

## 2023-03-31 MED ORDER — TIZANIDINE HCL 4 MG PO TABS: 4.0000 mg | ORAL_TABLET | Freq: Three times a day (TID) | ORAL | 5 refills | Status: DC | PRN

## 2023-03-31 MED ORDER — ONDANSETRON HCL 4 MG PO TABS: 4.0000 mg | ORAL_TABLET | Freq: Three times a day (TID) | ORAL | 1 refills | Status: DC | PRN

## 2023-03-31 MED ORDER — ALMOTRIPTAN MALATE 12.5 MG PO TABS: ORAL_TABLET | ORAL | 11 refills | Status: AC

## 2023-03-31 MED ORDER — ONABOTULINUMTOXINA 100 UNITS IJ SOLR
155.0000 [IU] | Freq: Once | INTRAMUSCULAR | Status: AC
Start: 2023-03-31 — End: 2023-03-31
  Administered 2023-03-31: 155 [IU] via INTRAMUSCULAR

## 2023-03-31 NOTE — Progress Notes (Signed)
   BOTOX PROCEDURE NOTE FOR MIGRAINE HEADACHE  HISTORY: Alexandra Warren is here for Botox.  This is her second Botox after restarting.  Remains on Ajovy. Did well after 1st round of Botox. Pre-Botox 8-10 migraines a month, this last 3 months 6-8 a month, weather dependent, lately weird weather pattern. Remains on Ajovy. Rotates Axert, Fioricet, Bernita Raisin.  Remains out of work, but is considering remote work.   Description of procedure:  The patient was placed in a sitting position. The standard protocol was used for Botox as follows, with 5 units of Botox injected at each site:  -Procerus muscle, midline injection  -Corrugator muscle, bilateral injection  -Frontalis muscle, bilateral injection, with 2 sites each side, medial injection was performed in the upper one third of the frontalis muscle, in the region vertical from the medial inferior edge of the superior orbital rim. The lateral injection was again in the upper one third of the forehead vertically above the lateral limbus of the cornea, 1.5 cm lateral to the medial injection site.  -Temporalis muscle injection, 4 sites, bilaterally. The first injection was 3 cm above the tragus of the ear, second injection site was 1.5 cm to 3 cm up from the first injection site in line with the tragus of the ear. The third injection site was 1.5-3 cm forward between the first 2 injection sites. The fourth injection site was 1.5 cm posterior to the second injection site.  -Occipitalis muscle injection, 3 sites, bilaterally. The first injection was done one half way between the occipital protuberance and the tip of the mastoid process behind the ear. The second injection site was done lateral and superior to the first, 1 fingerbreadth from the first injection. The third injection site was 1 fingerbreadth superiorly and medially from the first injection site.  -Cervical paraspinal muscle injection, 2 sites, bilateral, the first injection site was 1 cm from  the midline of the cervical spine, 3 cm inferior to the lower border of the occipital protuberance. The second injection site was 1.5 cm superiorly and laterally to the first injection site.  -Trapezius muscle injection was performed at 3 sites, bilaterally. The first injection site was in the upper trapezius muscle halfway between the inflection point of the neck, and the acromion. The second injection site was one half way between the acromion and the first injection site. The third injection was done between the first injection site and the inflection point of the neck.  A 200 unit bottle of Botox was used, 155 units were injected, the rest of the Botox was wasted. The patient tolerated the procedure well, there were no complications of the above procedure.  Botox NDC [id number] Lot number [id number] Expiration date 05/2025 BB  We discussed considering Qulipta in the future in place of Ajovy. We talked about Vyepti, but she has concern about passing out in infusion suite, needles. Will do another round of Botox, f/u in 6 months. Refilled tizanidine for migraine relief with muscle tension. Has been referred to PMR but has gone yet. Also has fibro.

## 2023-03-31 NOTE — Progress Notes (Signed)
 Botox- 100 units x 2 vials Lot: [id number] Expiration: 05/2025 NDC: 5409-8119-14  Bacteriostatic 0.9% Sodium Chloride- 4 mL  Lot: [id number] Expiration: 11/21/2023 NDC: 2130-8657-84  Dx: G43.009 B/B Witnessed by April J RN

## 2023-04-01 ENCOUNTER — Other Ambulatory Visit: Payer: Self-pay

## 2023-04-13 ENCOUNTER — Encounter: Payer: Self-pay | Admitting: Neurology

## 2023-04-14 MED ORDER — PREDNISONE 5 MG PO TABS: ORAL_TABLET | ORAL | 0 refills | Status: DC

## 2023-04-14 NOTE — Addendum Note (Signed)
 Addended by: Glean Salvo on: 04/14/2023 02:04 PM   Modules accepted: Orders

## 2023-05-18 ENCOUNTER — Encounter: Payer: Self-pay | Admitting: Internal Medicine

## 2023-05-18 ENCOUNTER — Other Ambulatory Visit (HOSPITAL_COMMUNITY): Payer: Self-pay

## 2023-05-18 ENCOUNTER — Telehealth: Payer: Self-pay

## 2023-05-18 NOTE — Telephone Encounter (Signed)
 Pharmacy Patient Advocate Encounter  Received notification from [facility] that Prior Authorization for Ubrelvy  100MG  tablets has been APPROVED from 05/18/2023 to 05/17/2024   PA #/Case ID/Reference #: PA Case ID #: [id number]

## 2023-05-18 NOTE — Telephone Encounter (Signed)
 Pharmacy Patient Advocate Encounter   Received notification from CoverMyMeds that prior authorization for Ubrelvy  100MG  tablets is required/requested.   Insurance verification completed.   The patient is insured through [facility] .   Per test claim: PA required; PA submitted to above mentioned insurance via CoverMyMeds Key/confirmation #/EOC [id number] Status is pending

## 2023-06-01 ENCOUNTER — Other Ambulatory Visit (HOSPITAL_COMMUNITY): Payer: Self-pay

## 2023-06-04 ENCOUNTER — Other Ambulatory Visit (HOSPITAL_COMMUNITY): Payer: Self-pay

## 2023-06-20 ENCOUNTER — Other Ambulatory Visit: Payer: Self-pay | Admitting: Neurology

## 2023-06-25 ENCOUNTER — Telehealth: Payer: Self-pay

## 2023-06-25 ENCOUNTER — Ambulatory Visit: Admitting: Neurology

## 2023-06-25 ENCOUNTER — Encounter: Payer: Self-pay | Admitting: Neurology

## 2023-06-25 VITALS — BP 124/74 | Ht 66.0 in | Wt 185.0 lb

## 2023-06-25 DIAGNOSIS — G43009 Migraine without aura, not intractable, without status migrainosus: Secondary | ICD-10-CM | POA: Diagnosis not present

## 2023-06-25 DIAGNOSIS — G43709 Chronic migraine without aura, not intractable, without status migrainosus: Secondary | ICD-10-CM

## 2023-06-25 MED ORDER — KETOROLAC TROMETHAMINE 30 MG/ML IJ SOLN: 30.0000 mg | Freq: Once | INTRAMUSCULAR | Status: AC

## 2023-06-25 MED ORDER — ONABOTULINUMTOXINA 100 UNITS IJ SOLR
155.0000 [IU] | Freq: Once | INTRAMUSCULAR | Status: AC
  Administered 2023-06-25: 155 [IU] via INTRAMUSCULAR

## 2023-06-25 MED ORDER — QULIPTA 60 MG PO TABS: 60.0000 mg | ORAL_TABLET | Freq: Every day | ORAL | 11 refills | Status: DC

## 2023-06-25 MED ORDER — QULIPTA 60 MG PO TABS: 1.0000 | ORAL_TABLET | Freq: Every day | ORAL | Status: DC

## 2023-06-25 NOTE — Telephone Encounter (Signed)
 Qulipta samples 3 boxes lot [id number] Ex 9/26 1 box [id number] ex 10/26

## 2023-06-25 NOTE — Patient Instructions (Signed)
 Check MRI brain with and without contrast Start Qulipta 60 mg daily for migraine prevention  Stop Ajovy  Try Nurtec samples Consider Nerivio for migraines

## 2023-06-25 NOTE — Progress Notes (Signed)
   BOTOX  PROCEDURE NOTE FOR MIGRAINE HEADACHE  HISTORY: Here for Botox . Last Botox  was 03/31/23. Reports had hard 3 months. In March and April had 15 days of straight migraine. Had a prednisone  taper in March. Reports back to having 12-15 migraines a month. Remains on Ajovy . Rotates Fioricet, Ubrelvy , Axert .   Description of procedure:  The patient was placed in a sitting position. The standard protocol was used for Botox  as follows, with 5 units of Botox  injected at each site:  -Procerus muscle, midline injection  -Corrugator muscle, bilateral injection  -Frontalis muscle, bilateral injection, with 2 sites each side, medial injection was performed in the upper one third of the frontalis muscle, in the region vertical from the medial inferior edge of the superior orbital rim. The lateral injection was again in the upper one third of the forehead vertically above the lateral limbus of the cornea, 1.5 cm lateral to the medial injection site.  -Temporalis muscle injection, 4 sites, bilaterally. The first injection was 3 cm above the tragus of the ear, second injection site was 1.5 cm to 3 cm up from the first injection site in line with the tragus of the ear. The third injection site was 1.5-3 cm forward between the first 2 injection sites. The fourth injection site was 1.5 cm posterior to the second injection site.  -Occipitalis muscle injection, 3 sites, bilaterally. The first injection was done one half way between the occipital protuberance and the tip of the mastoid process behind the ear. The second injection site was done lateral and superior to the first, 1 fingerbreadth from the first injection. The third injection site was 1 fingerbreadth superiorly and medially from the first injection site.  -Cervical paraspinal muscle injection, 2 sites, bilateral, the first injection site was 1 cm from the midline of the cervical spine, 3 cm inferior to the lower border of the occipital protuberance. The  second injection site was 1.5 cm superiorly and laterally to the first injection site.  -Trapezius muscle injection was performed at 3 sites, bilaterally. The first injection site was in the upper trapezius muscle halfway between the inflection point of the neck, and the acromion. The second injection site was one half way between the acromion and the first injection site. The third injection was done between the first injection site and the inflection point of the neck.   A 200 unit bottle of Botox  was used, 155 units were injected, the rest of the Botox  was wasted. The patient tolerated the procedure well, there were no complications of the above procedure.  Botox - 200 units x 1 vial Lot: [id number] Expiration: 11/2025 NDC: 2595-6387-56   BB

## 2023-06-25 NOTE — Progress Notes (Signed)
 Botox - 200 units x 1 vial Lot: [id number] Expiration: 11/2025 NDC: 6578-4696-29  Bacteriostatic 0.9% Sodium Chloride - 4 mL  Lot: [id number] Expiration: 07/19/2024 NDC: 4401-0272-53  Dx: G43.009 B/B Witnessed by Ronita Cohens

## 2023-06-25 NOTE — Progress Notes (Addendum)
 Patient: Alexandra Warren Date of Birth: 03/29/1980  Reason for Visit: Follow up History from: Patient Primary Neurologist: Gracie Lav  ASSESSMENT AND PLAN 43 y.o. year old female   1.  Chronic migraine headache 2.  History of right-sided breast cancer  - Continued frequent migraine headache, often for weeks at a time - Continue Botox  for migraine prevention, is helpful for frequency and intensity. Stop Ajovy , switch to Qulipta 60 mg daily for migraine prevention - Try Nurtec 75 mg tablet for acute migraine, given samples, has Ubrelvy  as alternative if not beneficial - Given 30 mg IM Toradol  injection for acute migraine today - Gave information about Nerivio  - Check MRI of the brain with and without contrast given worsening migraines despite multiple preventative medications, history of cancer - Previously tried and failed: Topamax , Emgality , beta-blockers, amitriptyline, Cymbalta, nortriptyline , Nurtec, Imitrex   HISTORY  She has migraine BOTOX  emaglity doing so well has helped her migraine 3-4 times a month, this is the best, axert  and fioriect prn did help her,     [name] is a 43 year-old left-handed Caucasian female referred by her primary care physician PA Leverne Reading for evaluation of migraine.    She has history of migraines since 2008, approximately age 79 or 44, typical migraine is right parietal retro-orbital area with severe achy pain, sometimes was associated with light sensitivity and noise sensitivity, lasting a couple hours or until able to go to sleep. Responded very well to Axert . Triggers for her migraines are stress, hormonal change, weather change.  She can have up to 20-30 migraines days each month, responding to Axert  75% of the time. A quarter of the time even though she was taking repeated those of Axert , she would suffer prolonged severe protracted migraine that can last a couple of days. She has tried Imitrex in the past which caused neck tightness.     She has been taking Topamax  100 mg every morning, which has been very helpful, previously tried beta blockers and amitriptyline without help.    She also has a history of depression and generalized anxiety taking Cymbalta 120 mg every day. Her father had history of migraine. She was previous under the care of local neurologist Dr. Aurelio Blower who since has left the practice.    MRI of the brain was normal 2009.    She is now taking Topamax  100mg  bid and Riboflavin /Magnesium .  Axert  is effective    UPDATE July 14th 2015: She only had 2 migraines in past 1 month, now she is less stressful, while she is on summer medication, Axert  continues to help, she also complains of excessive fatigue, sleepiness during the daytime, ESS score is 14, FSS is 59,   She denies snoring, has lost few pounds. But she has excessive daytime sleepiness, and fatigue.   UPDATE Mar 08 2018: Last visit was with Deadra Everts in July 2019, since then multiple phone conversation for increased headache, despite polypharmacy treatment, Cymbalta 60 mg daily, nortriptyline  20 mg at bedtime, clonazepam as needed, Trintellix 5 mg daily, she continue complains of 15 headache days each month, lateralized severe pounding headache with associated light noise sensitivity, lasting for hours to days,   She is taking Axert  as needed, which helps her most of the time, also seeing psychiatrist for anxiety, chronic insomnia,   UPDATE May 29 2021: Every 3 months Botox  injection, and monthly Emgality  injection has really helped her migraine, she now has 1-2 migraine every week, responding to Axert  that her,  She continues to suffer depression, anxiety, breast cancer treatment, was laid off from her job, tearful at today's visit   Update March 27, 2022 SS: Last seen in May 2023 for Botox , Emgality  with Dr. Gracie Lav. She started new insurance with her job then, her insurance denied the Botox . With Botox  was having 1-2 migraines a week, now 1 migraine  daily. Has Fioricet she has been using. Axert  is on Sport and exercise psychologist. Working in Audiological scientist, high pressure job, works in Paramedic. Finished breast cancer in November chemo, radiation, immunotherapy, port removed. In remission right now. On Lithium from psychiatry.    Update August 07, 2022 SS: Here for VV, got migraine today, vomiting didn't feel good driving. Has been taking Ajovy , down to 10-15 migraines, but they are debilitating, she has no quality of life, she is in the bed, cannot work, vomiting. Last injection was June 27, 2022. Insurance approved Ubrelvy  and Axert , uses Fioricet sparingly. She quit her job, migraines and other psychological issues. Still on Topamax  100 mg BID, not sure it really helps her. In the past has been the best on Botox . Is cannot function with her migraines  Update June 25, 2023 SS: Here for Botox , worsening migraine headache despite Botox  and Ajovy .  Has had 12-15 headache days per month.  Has had.'s of 2 weeks of headache straight.  Rotates Fioricet, Axert , Ubrelvy .  REVIEW OF SYSTEMS: Out of a complete 14 system review of symptoms, the patient complains only of the following symptoms, and all other reviewed systems are negative.  See HPI  ALLERGIES: Allergies  Allergen Reactions   Antihistamines, Diphenhydramine -Type Other (See Comments)   Diphenhydramine  Hcl Other (See Comments)    Restless leg   Promethazine  Other (See Comments)    Tingling sensation   Pseudoeph-Doxylamine-Dm-Apap Other (See Comments)    Nyquil- Tingling sensation in legs    HOME MEDICATIONS: Outpatient Medications Prior to Visit  Medication Sig Dispense Refill   almotriptan  (AXERT ) 12.5 MG tablet may repeat in 2 hours if needed 12 tablet 11   azelastine (ASTELIN) 0.1 % nasal spray Place 1 spray into both nostrils daily as needed for rhinitis.     botulinum toxin Type A  (BOTOX ) 200 units injection Inject 200 Units into the muscle every 3 (three) months. 1 each 3    butalbital -acetaminophen -caffeine  (FIORICET) 50-325-40 MG tablet TAKE 1 TABLET BY MOUTH UP TO EVERY 8 HOURS AS NEEDED FOR HEADACHE. 10 tablet 5   clonazePAM (KLONOPIN) 1 MG tablet Take 0.5-1 mg by mouth 2 (two) times daily as needed for anxiety.     lamoTRIgine (LAMICTAL) 150 MG tablet Take 150 mg by mouth daily.     lithium carbonate 300 MG capsule Take 900 mg by mouth once.     omeprazole (PRILOSEC) 20 MG capsule Take 20 mg by mouth daily.     ondansetron  (ZOFRAN ) 4 MG tablet Take 1 tablet (4 mg total) by mouth every 8 (eight) hours as needed for nausea or vomiting. 20 tablet 1   rosuvastatin (CRESTOR) 20 MG tablet Take 20 mg by mouth daily.     tiZANidine  (ZANAFLEX ) 4 MG tablet Take 1 tablet (4 mg total) by mouth every 8 (eight) hours as needed for muscle spasms (migraines). This is a 30 day rx. 20 tablet 5   UBRELVY  100 MG TABS TAKE 1 TABLET AT ONSET OF HEADACHE, CAN REPEAT IN HRS IF NEEDED**MAX IS 200MG  IN 24 HOURS 12 tablet 11   vortioxetine HBr (TRINTELLIX) 20 MG TABS tablet Take 20 mg by  mouth daily.  0   zolpidem (AMBIEN) 10 MG tablet Take 10 mg by mouth at bedtime as needed for sleep.     Fremanezumab -vfrm (AJOVY ) 225 MG/1.5ML SOAJ Inject 225 mg into the skin every 30 (thirty) days. 1.68 mL 11   predniSONE  (DELTASONE ) 5 MG tablet Start taking 6 tablets taper by 1 tablet daily until off (Patient not taking: Reported on 06/25/2023) 21 tablet 0   No facility-administered medications prior to visit.    PAST MEDICAL HISTORY: Past Medical History:  Diagnosis Date   Acid reflux    Anxiety and depression    Dr. 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) 09/21/2020   Migraine     PAST SURGICAL HISTORY: Past Surgical History:  Procedure Laterality Date   BREAST LUMPECTOMY  WITH RADIOACTIVE SEED LOCALIZATION Right 10/17/2020   Procedure: RIGHT BREAST LUMPECTOMY WITH RADIOACTIVE SEED LOCALIZATION WITH RIGHT SENTINEL LYMPH NODE BIOPSY;  Surgeon: Lockie Rima, MD;  Location: [facility] OR;  Service: General;  Laterality: Right;   CHOLECYSTECTOMY  12/2004   PORTACATH PLACEMENT Left 10/17/2020   Procedure: INSERTION PORT-A-CATH;  Surgeon: Lockie Rima, MD;  Location: [facility] OR;  Service: General;  Laterality: Left;    FAMILY HISTORY: Family History  Problem Relation Age of Onset   Healthy Mother    Hypertension Father    Skin cancer Brother        dx before 61   Colon cancer Maternal Aunt 50   Cervical cancer Maternal Aunt        dx before 78; x2   Cancer Paternal Grandmother 48       unknown primary; ? liver   Lung cancer Paternal Grandfather        dx after 26   Breast cancer Cousin 62       maternal cousin   Colon cancer Other        MGM's sister   Gastric cancer Other        PGM's sister   Leukemia Other        PMG's sister   Rectal cancer Neg Hx    Esophageal cancer Neg Hx     SOCIAL HISTORY: Social History   Socioeconomic History   Marital status: Divorced    Spouse name: Reymundo Caulk   Number of children: 0   Years of education: MS   Highest education level: Not on file  Occupational History   Occupation: Airline pilot  Tobacco Use   Smoking status: Never   Smokeless tobacco: Never  Vaping Use   Vaping status: Never Used  Substance and Sexual Activity   Alcohol use: Not Currently    Comment: pt states 1 glass of wine maybe once a month   Drug use: No   Sexual activity: Yes    Birth control/protection: Condom  Other Topics Concern   Not on file  Social History Narrative   Accountant for commercial firm; wants short term disability;  never smoked; 2 drinks a month. Lives in [place] with boy friend [[place] No children. Has one brother    Social Drivers of Corporate investment banker Strain: Not on file  Food Insecurity: Not  on file  Transportation Needs: Not on file  Physical Activity: Not on file  Stress: Not on file  Social Connections: Not on file  Intimate Partner Violence: Not on file    PHYSICAL EXAM  Vitals:   06/25/23 1544  BP: 124/74  Weight: 185 lb (83.9 kg)  Height: 5\' 6"  (1.676 m)   Body mass index is 29.86 kg/m.  Generalized: Well developed, in no acute distress  Neurological examination  Mentation: Alert oriented to time, place, history taking. Follows all commands speech and language fluent Cranial nerve II-XII:  Extraocular movements were full, visual field were full on confrontational test. Facial sensation and strength were normal. . Head turning and shoulder shrug  were normal and symmetric. Motor: The motor testing reveals 5 over 5 strength of all 4 extremities. Good symmetric motor tone is noted throughout.  Sensory: Sensory testing is intact to soft touch on all 4 extremities. No evidence of extinction is noted.  Gait and station: Gait is normal.   DIAGNOSTIC DATA (LABS, IMAGING, TESTING) - I reviewed patient records, labs, notes, testing and imaging myself where available.  Lab Results  Component Value Date   WBC 7.1 04/26/2021   HGB 13.7 04/26/2021   HCT 40.4 04/26/2021   MCV 94.8 04/26/2021   PLT 293 04/26/2021      Component Value Date/Time   NA 134 (L) 04/26/2021 0824   K 3.7 04/26/2021 0824   CL 105 04/26/2021 0824   CO2 21 (L) 04/26/2021 0824   GLUCOSE 106 (H) 04/26/2021 0824   BUN 17 04/26/2021 0824   CREATININE 0.70 04/26/2021 0824   CALCIUM 8.6 (L) 04/26/2021 0824   PROT 6.8 04/26/2021 0824   ALBUMIN 4.1 04/26/2021 0824   AST 24 04/26/2021 0824   ALT 28 04/26/2021 0824   ALKPHOS 56 04/26/2021 0824   BILITOT 0.4 04/26/2021 0824   GFRNONAA >60 04/26/2021 0824   Lab Results  Component Value Date   CHOL 142 12/21/2020   HDL 54 12/21/2020   LDLCALC 73 12/21/2020   TRIG 73 12/21/2020   CHOLHDL 2.6 12/21/2020   No results found for: "HGBA1C" No  results found for: "VITAMINB12" No results found for: "TSH"  Jeanmarie Millet, AGNP-C, DNP 06/25/2023, 4:18 PM [facility] [address], [address] [place], [place] [address] [phone]  I personally spent a total of 45 minutes in the care of the patient today including preparing to see the patient, getting/reviewing separately obtained history, performing a medically appropriate exam/evaluation, counseling and educating, placing orders, documenting clinical information in the EHR, communicating results, and coordinating care.

## 2023-06-26 ENCOUNTER — Encounter: Payer: Self-pay | Admitting: Neurology

## 2023-06-26 ENCOUNTER — Telehealth: Payer: Self-pay

## 2023-06-26 ENCOUNTER — Other Ambulatory Visit (HOSPITAL_COMMUNITY): Payer: Self-pay

## 2023-06-26 ENCOUNTER — Other Ambulatory Visit: Payer: Self-pay

## 2023-06-26 ENCOUNTER — Encounter: Payer: Self-pay | Admitting: Internal Medicine

## 2023-06-26 NOTE — Telephone Encounter (Signed)
 Pharmacy Patient Advocate Encounter   Received notification from CoverMyMeds that prior authorization for Qulipta 60MG  tablets is required/requested.   Insurance verification completed.   The patient is insured through [facility] .   Per test claim: PA required; PA submitted to above mentioned insurance via CoverMyMeds Key/confirmation #/EOC [id number] Status is pending

## 2023-06-26 NOTE — Telephone Encounter (Signed)
 Pharmacy Patient Advocate Encounter  Received notification from [facility] that Prior Authorization for Qulipta 60MG g Tablets has been APPROVED from 06/26/2023 to 09/24/2023   PA #/Case ID/Reference #: PA Case ID #: [id number]

## 2023-06-29 ENCOUNTER — Telehealth: Payer: Self-pay | Admitting: Neurology

## 2023-06-29 NOTE — Telephone Encounter (Signed)
 [employer] Lexa: [id number] exp. 06/29/23-08/27/23 sent to GI [phone]

## 2023-07-20 ENCOUNTER — Other Ambulatory Visit (HOSPITAL_COMMUNITY): Payer: Self-pay

## 2023-07-31 ENCOUNTER — Encounter: Payer: Self-pay | Admitting: Neurology

## 2023-08-04 MED ORDER — NERIVIO DEVI: 1.0000 | 12 refills | Status: DC

## 2023-08-04 MED ORDER — EMGALITY 120 MG/ML ~~LOC~~ SOAJ: 120.0000 mg | SUBCUTANEOUS | 11 refills | Status: AC

## 2023-08-04 NOTE — Addendum Note (Signed)
 Addended by: GAYLAND LAURAINE PARAS on: 08/04/2023 08:00 AM   Modules accepted: Orders

## 2023-08-04 NOTE — Telephone Encounter (Signed)
 Will switch to Emgality  for migraine prevention. Has not benefited from Qulipta  and has side effect of nausea. Will also send in Nerivio.   Meds ordered this encounter  Medications   Nerve Stimulator (NERIVIO) DEVI    Sig: 1 Device by Does not apply route as directed. Dual use 45 min treat every other day for prevention or at the onset of migraine    Dispense:  1 each    Refill:  12   Galcanezumab -gnlm (EMGALITY ) 120 MG/ML SOAJ    Sig: Inject 120 mg into the skin every 30 (thirty) days.    Dispense:  1.12 mL    Refill:  11

## 2023-08-08 ENCOUNTER — Other Ambulatory Visit: Payer: Self-pay | Admitting: Neurology

## 2023-08-11 ENCOUNTER — Telehealth: Payer: Self-pay

## 2023-08-11 ENCOUNTER — Other Ambulatory Visit (HOSPITAL_COMMUNITY): Payer: Self-pay

## 2023-08-11 NOTE — Telephone Encounter (Signed)
 Will the PT require the loading dose?

## 2023-08-12 ENCOUNTER — Other Ambulatory Visit (HOSPITAL_COMMUNITY): Payer: Self-pay

## 2023-08-12 ENCOUNTER — Encounter: Payer: Self-pay | Admitting: Internal Medicine

## 2023-08-12 NOTE — Telephone Encounter (Signed)
 Pharmacy Patient Advocate Encounter  Received notification from [facility] that Prior Authorization for Emgality  120MG /ML auto-injectors (migraine) has been APPROVED from 08/12/2023 to 11/10/2023   PA #/Case ID/Reference #: PA Case: [id number]

## 2023-08-15 ENCOUNTER — Ambulatory Visit
Admission: RE | Admit: 2023-08-15 | Discharge: 2023-08-15 | Disposition: A | Discharge status: Home / Self Care | Source: Ambulatory Visit | Attending: Neurology | Admitting: Neurology

## 2023-08-15 DIAGNOSIS — G43009 Migraine without aura, not intractable, without status migrainosus: Secondary | ICD-10-CM | POA: Diagnosis not present

## 2023-08-15 MED ORDER — GADOPICLENOL 0.5 MMOL/ML IV SOLN
8.0000 mL | Freq: Once | INTRAVENOUS | Status: AC | PRN
  Administered 2023-08-15: 8 mL via INTRAVENOUS

## 2023-08-17 ENCOUNTER — Ambulatory Visit: Payer: Self-pay | Admitting: Neurology

## 2023-08-27 ENCOUNTER — Other Ambulatory Visit (HOSPITAL_COMMUNITY): Payer: Self-pay

## 2023-09-01 ENCOUNTER — Encounter: Payer: Self-pay | Admitting: Neurology

## 2023-09-03 ENCOUNTER — Other Ambulatory Visit (HOSPITAL_COMMUNITY): Payer: Self-pay

## 2023-09-07 ENCOUNTER — Telehealth: Payer: Self-pay | Admitting: Neurology

## 2023-09-07 NOTE — Telephone Encounter (Signed)
 Completed [employer] Botox  PA form and faxed with OV notes to [phone].

## 2023-09-10 ENCOUNTER — Other Ambulatory Visit: Payer: Self-pay | Admitting: Neurology

## 2023-09-14 ENCOUNTER — Telehealth: Payer: Self-pay

## 2023-09-14 ENCOUNTER — Other Ambulatory Visit (HOSPITAL_COMMUNITY): Payer: Self-pay

## 2023-09-14 NOTE — Telephone Encounter (Signed)
 Pharmacy Patient Advocate Encounter  Received notification from [facility] that Prior Authorization for Almotriptan   has been APPROVED from 09/14/2023 to 09/13/2024   PA #/Case ID/Reference #: [id number]  KEY: [id number]

## 2023-09-16 NOTE — Telephone Encounter (Signed)
 Requested Prescriptions   Pending Prescriptions Disp Refills   butalbital -acetaminophen -caffeine  (FIORICET) 50-325-40 MG tablet [Pharmacy Med Name: BUTALB-ACETAMIN-CAFF 50-325-40] 10 tablet 5    Sig: TAKE 1 TABLET BY MOUTH UP TO EVERY 8 HOURS AS NEEDED FOR HEADACHE.   Last seen 06/25/23 Next appt 09/22/23 Dispenses   Dispensed Days Supply Quantity Provider Pharmacy  BUTALB-ACETAMIN-CAFF [id number] 08/08/2023 4 10 each Onita Duos, MD [facility] [phone] - [facility]...  BUTALB-ACETAMIN-CAFF 50-325-40 06/20/2023 4 10 each Onita Duos, MD [facility] [phone] - [facility]...  BUTALB-ACETAMIN-CAFF 50-325-40 05/26/2023 4 10 each Onita Duos, MD [facility] [phone] - [facility]...  BUTALB-ACETAMIN-CAFF 50-325-40 04/07/2023 4 10 each Onita Duos, MD [facility] [phone] - [facility]...  BUTALB-ACETAMIN-CAFF [id number] 02/18/2023 4 10 each Onita Duos, MD [facility] [phone] - G...  BUTALB-ACETAMIN-CAFF 50-325-40 01/01/2023 4 10 each Onita Duos, MD [facility] [phone] - G...  BUTALB-ACETAMIN-CAFF [id number] 10/29/2022 4 10 each Gayland Lauraine PARAS, NP [facility] [phone] - [facility]

## 2023-09-22 ENCOUNTER — Ambulatory Visit: Admitting: Neurology

## 2023-09-29 ENCOUNTER — Other Ambulatory Visit: Payer: Self-pay

## 2023-09-29 MED ORDER — ONABOTULINUMTOXINA 200 UNITS IJ SOLR: 200.0000 [IU] | INTRAMUSCULAR | 3 refills | Status: DC

## 2023-09-29 NOTE — Addendum Note (Signed)
 Addended by: ONEITA NEVELYN BRAVO on: 09/29/2023 03:43 PM   Modules accepted: Orders

## 2023-09-29 NOTE — Telephone Encounter (Signed)
 [employer] is now saying PA is no longer required for SP or B/B, please send rx to [facility] in [place].

## 2023-09-29 NOTE — Telephone Encounter (Signed)
 Also received fax from [facility] confirming no PA required.

## 2023-09-29 NOTE — Telephone Encounter (Signed)
 Refilled as requested

## 2023-09-30 ENCOUNTER — Telehealth: Admitting: Neurology

## 2023-09-30 NOTE — Telephone Encounter (Signed)
 Called [facility] SP, they are processing prescription/insurance information. Nothing currently needed from us .

## 2023-10-05 NOTE — Telephone Encounter (Signed)
 Robyn from [employer] has called to report they are out of network for pt. Their call back # is [phone] Pt has Healthy Blue managed by [employer] this will have to be buy and bill per [place].

## 2023-10-05 NOTE — Telephone Encounter (Signed)
 VV is scheduled for this Wednesday, 9/17.

## 2023-10-07 ENCOUNTER — Telehealth (INDEPENDENT_AMBULATORY_CARE_PROVIDER_SITE_OTHER): Admitting: Neurology

## 2023-10-07 DIAGNOSIS — G43009 Migraine without aura, not intractable, without status migrainosus: Secondary | ICD-10-CM

## 2023-10-07 DIAGNOSIS — G43709 Chronic migraine without aura, not intractable, without status migrainosus: Secondary | ICD-10-CM | POA: Diagnosis not present

## 2023-10-07 MED ORDER — TIZANIDINE HCL 4 MG PO TABS: 4.0000 mg | ORAL_TABLET | Freq: Three times a day (TID) | ORAL | 5 refills | Status: AC | PRN

## 2023-10-07 MED ORDER — ONDANSETRON HCL 4 MG PO TABS: 4.0000 mg | ORAL_TABLET | Freq: Three times a day (TID) | ORAL | 3 refills | Status: DC | PRN

## 2023-10-07 NOTE — Progress Notes (Signed)
 Patient: Alexandra Warren Date of Birth: [birth year]  Reason for Visit: Follow up History from: Patient Primary Neurologist: Onita  Virtual Visit via Video Note  I connected with Alexandra Warren on 10/07/23 at  2:45 PM EDT by a video enabled telemedicine application and verified that I am speaking with the correct person using two identifiers.  Location: Patient: at her home Provider: in the office    I discussed the limitations of evaluation and management by telemedicine and the availability of in person appointments. The patient expressed understanding and agreed to proceed.  ASSESSMENT AND PLAN 43 y.o. year old female   1.  Chronic migraine headache 2.  History of right-sided breast cancer  - Continue Emgality  120 mg monthly injection for migraine prevention - Can no longer offer Botox  due to insurance change, no specialty pharmacy available - Continue Ubrelvy  100 mg as needed for acute migraine, alternates very carefully with Axert , Fioricet.  Zofran , tizanidine  for severe migraine - MRI of the brain with and without contrast was normal - Previously tried and failed: Topamax , Emgality , beta-blockers, amitriptyline, Cymbalta, nortriptyline , Nurtec, Imitrex, Ajovy , Qulipta , Nerivio was too expensive - Next steps: Vyepti, referral to outside headache clinic - Follow up virtually in 6 months  HISTORY  She has migraine BOTOX  emaglity doing so well has helped her migraine 3-4 times a month, this is the best, axert  and fioriect prn did help her,     Alexandra Warren is a 43 year-old left-handed Caucasian female referred by her primary care physician PA Lucie Mace for evaluation of migraine.    She has history of migraines since 2008, approximately age 8 or 43, typical migraine is right parietal retro-orbital area with severe achy pain, sometimes was associated with light sensitivity and noise sensitivity, lasting a couple hours or until able to go to sleep. Responded very  well to Axert . Triggers for her migraines are stress, hormonal change, weather change.  She can have up to 20-30 migraines days each month, responding to Axert  75% of the time. A quarter of the time even though she was taking repeated those of Axert , she would suffer prolonged severe protracted migraine that can last a couple of days. She has tried Imitrex in the past which caused neck tightness.    She has been taking Topamax  100 mg every morning, which has been very helpful, previously tried beta blockers and amitriptyline without help.    She also has a history of depression and generalized anxiety taking Cymbalta 120 mg every day. Her father had history of migraine. She was previous under the care of local neurologist Dr. Verneta who since has left the practice.    MRI of the brain was normal 2009.    She is now taking Topamax  100mg  bid and Riboflavin /Magnesium .  Axert  is effective    UPDATE July 14th 2015: She only had 2 migraines in past 1 month, now she is less stressful, while she is on summer medication, Axert  continues to help, she also complains of excessive fatigue, sleepiness during the daytime, ESS score is 14, FSS is 59,   She denies snoring, has lost few pounds. But she has excessive daytime sleepiness, and fatigue.   UPDATE Mar 08 2018: Last visit was with Aleck in July 2019, since then multiple phone conversation for increased headache, despite polypharmacy treatment, Cymbalta 60 mg daily, nortriptyline  20 mg at bedtime, clonazepam as needed, Trintellix 5 mg daily, she continue complains of 15 headache days each month, lateralized severe  pounding headache with associated light noise sensitivity, lasting for hours to days,   She is taking Axert  as needed, which helps her most of the time, also seeing psychiatrist for anxiety, chronic insomnia,   UPDATE May 29 2021: Every 3 months Botox  injection, and monthly Emgality  injection has really helped her migraine, she now has 1-2  migraine every week, responding to Axert  that her,   She continues to suffer depression, anxiety, breast cancer treatment, was laid off from her job, tearful at today's visit   Update March 27, 2022 SS: Last seen in May 2023 for Botox , Emgality  with Dr. Onita. She started new insurance with her job then, her insurance denied the Botox . With Botox  was having 1-2 migraines a week, now 1 migraine daily. Has Fioricet she has been using. Axert  is on Sport and exercise psychologist. Working in Audiological scientist, high pressure job, works in Paramedic. Finished breast cancer in November chemo, radiation, immunotherapy, port removed. In remission right now. On Lithium from psychiatry.    Update August 07, 2022 SS: Here for VV, got migraine today, vomiting didn't feel good driving. Has been taking Ajovy , down to 10-15 migraines, but they are debilitating, she has no quality of life, she is in the bed, cannot work, vomiting. Last injection was June 27, 2022. Insurance approved Ubrelvy  and Axert , uses Fioricet sparingly. She quit her job, migraines and other psychological issues. Still on Topamax  100 mg BID, not sure it really helps her. In the past has been the best on Botox . Is cannot function with her migraines  Update June 25, 2023 SS: Here for Botox , worsening migraine headache despite Botox  and Ajovy .  Has had 12-15 headache days per month.  Has had.'s of 2 weeks of headache straight.  Rotates Fioricet, Axert , Ubrelvy .  Update October 07, 2023 SS: We last did Botox  06/25/2023, waiting for insurance approval for next cycle.  Try Qulipta  was not beneficial and had nausea.  Switch to Emgality .  Also sent in Nerivio, was too expensive.  MRI of the brain with and without contrast was normal. 4 days straight of migraine lately. 3 continuous cycles of Botox , really hard to tell benefit. This last cycle, had a few good weeks. Has only had Emgality  for 1 month, has felt good benefit with it, some days this month that were truly migraine free! On  average, 3-4 migraines a week. Other days, underlying mild to moderate headache, 1-2 days a week with no headache. Nurtec samples weren't superior to Ubrelvy .   REVIEW OF SYSTEMS: Out of a complete 14 system review of symptoms, the patient complains only of the following symptoms, and all other reviewed systems are negative.  See HPI  ALLERGIES: Allergies  Allergen Reactions   Antihistamines, Diphenhydramine -Type Other (See Comments)   Diphenhydramine  Hcl Other (See Comments)    Restless leg   Promethazine  Other (See Comments)    Tingling sensation   Pseudoeph-Doxylamine-Dm-Apap Other (See Comments)    Nyquil- Tingling sensation in legs    HOME MEDICATIONS: Outpatient Medications Prior to Visit  Medication Sig Dispense Refill   almotriptan  (AXERT ) 12.5 MG tablet may repeat in 2 hours if needed 12 tablet 11   azelastine (ASTELIN) 0.1 % nasal spray Place 1 spray into both nostrils daily as needed for rhinitis.     botulinum toxin Type A  (BOTOX ) 200 units injection Inject 200 Units into the muscle every 3 (three) months. 1 each 3   butalbital -acetaminophen -caffeine  (FIORICET) 50-325-40 MG tablet TAKE 1 TABLET BY MOUTH UP TO EVERY 8 HOURS  AS NEEDED FOR HEADACHE. 10 tablet 5   clonazePAM (KLONOPIN) 1 MG tablet Take 0.5-1 mg by mouth 2 (two) times daily as needed for anxiety.     Galcanezumab -gnlm (EMGALITY ) 120 MG/ML SOAJ Inject 120 mg into the skin every 30 (thirty) days. 1.12 mL 11   lamoTRIgine (LAMICTAL) 150 MG tablet Take 150 mg by mouth daily.     lithium carbonate 300 MG capsule Take 900 mg by mouth once.     Nerve Stimulator (NERIVIO) DEVI 1 Device by Does not apply route as directed. Dual use 45 min treat every other day for prevention or at the onset of migraine 1 each 12   omeprazole (PRILOSEC) 20 MG capsule Take 20 mg by mouth daily.     predniSONE  (DELTASONE ) 5 MG tablet Start taking 6 tablets taper by 1 tablet daily until off (Patient not taking: Reported on 06/25/2023) 21  tablet 0   rosuvastatin (CRESTOR) 20 MG tablet Take 20 mg by mouth daily.     UBRELVY  100 MG TABS TAKE 1 TABLET AT ONSET OF HEADACHE, CAN REPEAT IN HRS IF NEEDED**MAX IS 200MG  IN 24 HOURS 12 tablet 11   vortioxetine HBr (TRINTELLIX) 20 MG TABS tablet Take 20 mg by mouth daily.  0   zolpidem (AMBIEN) 10 MG tablet Take 10 mg by mouth at bedtime as needed for sleep.     Atogepant  (QULIPTA ) 60 MG TABS Take 1 tablet (60 mg total) by mouth daily.     ondansetron  (ZOFRAN ) 4 MG tablet TAKE 1 TABLET BY MOUTH EVERY 8 HOURS AS NEEDED FOR NAUSEA AND VOMITING 20 tablet 1   tiZANidine  (ZANAFLEX ) 4 MG tablet Take 1 tablet (4 mg total) by mouth every 8 (eight) hours as needed for muscle spasms (migraines). This is a 30 day rx. 20 tablet 5   No facility-administered medications prior to visit.    PAST MEDICAL HISTORY: Past Medical History:  Diagnosis Date   Acid reflux    Anxiety and depression    Dr. 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   Migraine     PAST SURGICAL HISTORY: Past Surgical History:  Procedure Laterality Date   BREAST LUMPECTOMY WITH RADIOACTIVE SEED LOCALIZATION Right 10/17/2020   Procedure: RIGHT BREAST LUMPECTOMY WITH RADIOACTIVE SEED LOCALIZATION WITH RIGHT SENTINEL LYMPH NODE BIOPSY;  Surgeon: Aron Shoulders, MD;  Location: [facility] OR;  Service: General;  Laterality: Right;   CHOLECYSTECTOMY  12/2004   PORTACATH PLACEMENT Left 10/17/2020   Procedure: INSERTION PORT-A-CATH;  Surgeon: Aron Shoulders, MD;  Location: [facility] OR;  Service: General;  Laterality: Left;    FAMILY HISTORY: Family History  Problem Relation Age of Onset   Healthy Mother    Hypertension Father    Skin cancer Brother        dx before 32   Colon cancer Maternal Aunt 50   Cervical cancer  Maternal Aunt        dx before 51; x2   Cancer Paternal Grandmother 13       unknown primary; ? liver   Lung cancer Paternal Grandfather        dx after 13   Breast cancer Cousin 60       maternal cousin   Colon cancer Other  MGM's sister   Gastric cancer Other        PGM's sister   Leukemia Other        PMG's sister   Rectal cancer Neg Hx    Esophageal cancer Neg Hx     SOCIAL HISTORY: Social History   Socioeconomic History   Marital status: Divorced    Spouse name: Selinda   Number of children: 0   Years of education: MS   Highest education level: Not on file  Occupational History   Occupation: Airline pilot  Tobacco Use   Smoking status: Never   Smokeless tobacco: Never  Vaping Use   Vaping status: Never Used  Substance and Sexual Activity   Alcohol use: Not Currently    Comment: pt states 1 glass of wine maybe once a month   Drug use: No   Sexual activity: Yes    Birth control/protection: Condom  Other Topics Concern   Not on file  Social History Narrative   Accountant for commercial firm; wants short term disability;  never smoked; 2 drinks a month. Lives in [place] with boy friend [[place] No children. Has one brother    Social Drivers of Corporate investment banker Strain: Not on file  Food Insecurity: Not on file  Transportation Needs: Not on file  Physical Activity: Not on file  Stress: Not on file  Social Connections: Not on file  Intimate Partner Violence: Not on file   PHYSICAL EXAM  There were no vitals filed for this visit.  There is no height or weight on file to calculate BMI. Virtual Visit   DIAGNOSTIC DATA (LABS, IMAGING, TESTING) - I reviewed patient records, labs, notes, testing and imaging myself where available.  Lab Results  Component Value Date   WBC 7.1 04/26/2021   HGB 13.7 04/26/2021   HCT 40.4 04/26/2021   MCV 94.8 04/26/2021   PLT 293 04/26/2021      Component Value Date/Time   NA 134 (L)  04/26/2021 0824   K 3.7 04/26/2021 0824   CL 105 04/26/2021 0824   CO2 21 (L) 04/26/2021 0824   GLUCOSE 106 (H) 04/26/2021 0824   BUN 17 04/26/2021 0824   CREATININE 0.70 04/26/2021 0824   CALCIUM 8.6 (L) 04/26/2021 0824   PROT 6.8 04/26/2021 0824   ALBUMIN 4.1 04/26/2021 0824   AST 24 04/26/2021 0824   ALT 28 04/26/2021 0824   ALKPHOS 56 04/26/2021 0824   BILITOT 0.4 04/26/2021 0824   GFRNONAA >60 04/26/2021 0824   Lab Results  Component Value Date   CHOL 142 12/21/2020   HDL 54 12/21/2020   LDLCALC 73 12/21/2020   TRIG 73 12/21/2020   CHOLHDL 2.6 12/21/2020   No results found for: HGBA1C No results found for: VITAMINB12 No results found for: TSH  Lauraine Born, AGNP-C, DNP 10/07/2023, 3:00 PM [facility] [address] [place], [place] [address] [phone]

## 2023-10-07 NOTE — Patient Instructions (Addendum)
 Great to see you today! Continue current medications We will hold Botox  Call for increase in headache Follow-up in 6 months virtually.  Thanks!!

## 2023-10-08 ENCOUNTER — Encounter: Payer: Self-pay | Admitting: Neurology

## 2023-10-08 ENCOUNTER — Other Ambulatory Visit: Payer: Self-pay

## 2023-10-08 ENCOUNTER — Telehealth

## 2023-10-08 MED ORDER — PREDNISONE 5 MG PO TABS: ORAL_TABLET | ORAL | 0 refills | Status: DC

## 2023-10-12 ENCOUNTER — Encounter: Payer: Self-pay | Admitting: Neurology

## 2023-10-13 MED ORDER — UBRELVY 100 MG PO TABS: 100.0000 mg | ORAL_TABLET | ORAL | 11 refills | Status: AC | PRN

## 2023-10-13 NOTE — Telephone Encounter (Signed)
 I called patient. Feeling better today. Ubrelvy  is helping. But she is running low on Ubrelvy . Prednisone  started working, as tapering more headache. Will see if we can get # 16 tablets of Ubrelvy . Also, may need to go to urgent care for migraine cocktail.

## 2023-10-14 ENCOUNTER — Encounter: Payer: Self-pay | Admitting: Emergency Medicine

## 2023-10-14 ENCOUNTER — Ambulatory Visit
Admission: EM | Admit: 2023-10-14 | Discharge: 2023-10-14 | Disposition: A | Discharge status: Home / Self Care | Attending: Emergency Medicine | Admitting: Emergency Medicine

## 2023-10-14 DIAGNOSIS — G43919 Migraine, unspecified, intractable, without status migrainosus: Secondary | ICD-10-CM | POA: Diagnosis not present

## 2023-10-14 MED ORDER — DEXAMETHASONE SODIUM PHOSPHATE 10 MG/ML IJ SOLN
10.0000 mg | Freq: Once | INTRAMUSCULAR | Status: AC
  Administered 2023-10-14: 10 mg via INTRAMUSCULAR

## 2023-10-14 MED ORDER — KETOROLAC TROMETHAMINE 30 MG/ML IJ SOLN
30.0000 mg | Freq: Once | INTRAMUSCULAR | Status: AC
  Administered 2023-10-14: 30 mg via INTRAMUSCULAR

## 2023-10-14 NOTE — ED Provider Notes (Signed)
 [facility]    CSN: [id number] Arrival date & time: 10/14/23  1705      History   Chief Complaint Chief Complaint  Patient presents with   Migraine   Nausea    HPI Alexandra Warren is a 43 y.o. female.  Patient presents with a migraine headache x 13 days.  No trauma.  She states this is a migraine headache with associated nausea and vomiting.  2 episodes of emesis.  She has attempted treatment with prednisone , Ubrelvy , Fioricet, tizanidine , Axert .  She took 1 tablet of Ubrelvy  and 1 tablet of Fioricet early this morning.  She took Zofran  this afternoon.  No numbness, weakness, dizziness, chest pain, shortness of breath, abdominal pain.  Patient had a video visit with neurology on 10/07/2023; diagnosed with chronic migraine headache.  She was prescribed prednisone  taper by neurology on 10/08/2023.  Neurology refilled her Ubrelvy  on 10/13/2023; at that time it is noted in her chart that she was feeling better and that Ubrelvy  was helping.  LMP 2 weeks ago.  The history is provided by the patient and medical records.    Past Medical History:  Diagnosis Date   Acid reflux    Anxiety and depression    Dr. 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   Migraine     Patient Active Problem List   Diagnosis Date Noted   Migraine without aura and without status migrainosus, not intractable 05/29/2021   Depression with anxiety 05/29/2021   Genetic testing 10/02/2020   Carcinoma of upper-inner quadrant of right breast in female, estrogen receptor positive (HCC) 09/21/2020   Family history of breast cancer 09/21/2020   Family history of malignant neoplasm of digestive organ 09/21/2020   Insomnia 08/02/2013   Migraine without aura 07/27/2012   Generalized  anxiety disorder 07/27/2012   Depression 07/27/2012    Past Surgical History:  Procedure Laterality Date   BREAST LUMPECTOMY WITH RADIOACTIVE SEED LOCALIZATION Right 10/17/2020   Procedure: RIGHT BREAST LUMPECTOMY WITH RADIOACTIVE SEED LOCALIZATION WITH RIGHT SENTINEL LYMPH NODE BIOPSY;  Surgeon: Aron Shoulders, MD;  Location: [facility] OR;  Service: General;  Laterality: Right;   CHOLECYSTECTOMY  12/2004   PORTACATH PLACEMENT Left 10/17/2020   Procedure: INSERTION PORT-A-CATH;  Surgeon: Aron Shoulders, MD;  Location: [facility] OR;  Service: General;  Laterality: Left;    OB History   No obstetric history on file.      Home Medications    Prior to Admission medications   Medication Sig Start Date End Date Taking? Authorizing Provider  almotriptan  (AXERT ) 12.5 MG tablet may repeat in 2 hours if needed 03/31/23   Gayland Lauraine PARAS, NP  azelastine (ASTELIN) 0.1 % nasal spray Place 1 spray into both nostrils daily as needed for rhinitis. 07/30/18   [provider]  botulinum toxin Type A  (BOTOX ) 200 units injection Inject 200 Units into the muscle every 3 (three) months. 09/29/23   Gayland Lauraine PARAS, NP  butalbital -acetaminophen -caffeine  (FIORICET) 50-325-40 MG tablet TAKE 1 TABLET BY MOUTH UP TO EVERY 8 HOURS AS NEEDED FOR HEADACHE. 09/16/23   Onita Duos, MD  clonazePAM (KLONOPIN) 1 MG tablet Take 0.5-1 mg by mouth 2 (two) times daily as needed for anxiety.    [provider]  Galcanezumab -gnlm (EMGALITY ) 120 MG/ML SOAJ Inject 120 mg into the skin every 30 (thirty) days. 08/04/23   Gayland Lauraine PARAS, NP  lamoTRIgine (LAMICTAL) 150 MG tablet Take 150 mg by mouth daily. 06/20/23   [provider]  lithium carbonate 300 MG capsule Take 900 mg by mouth once.    [provider]  Nerve Stimulator (NERIVIO) DEVI 1 Device by Does not apply route as directed. Dual use 45 min treat every other day for prevention or at the onset of migraine 08/04/23   Gayland Lauraine PARAS, NP  omeprazole (PRILOSEC) 20  MG capsule Take 20 mg by mouth daily.    [provider]  ondansetron  (ZOFRAN ) 4 MG tablet Take 1 tablet (4 mg total) by mouth every 8 (eight) hours as needed for nausea or vomiting. 10/07/23   Gayland Lauraine PARAS, NP  predniSONE  (DELTASONE ) 5 MG tablet Start taking 6 tablets taper by 1 tablet daily until off 10/08/23   Gayland Lauraine PARAS, NP  rosuvastatin (CRESTOR) 20 MG tablet Take 20 mg by mouth daily.    [provider]  tiZANidine  (ZANAFLEX ) 4 MG tablet Take 1 tablet (4 mg total) by mouth every 8 (eight) hours as needed for muscle spasms (migraines). This is a 30 day rx. 10/07/23   Gayland Lauraine PARAS, NP  Ubrogepant  (UBRELVY ) 100 MG TABS Take 1 tablet (100 mg total) by mouth as needed. Take 1 tablet at onset of headache, may repeat in 2 hours if needed. Max is 200 mg in 24 hours. 10/13/23   Gayland Lauraine PARAS, NP  vortioxetine HBr (TRINTELLIX) 20 MG TABS tablet Take 20 mg by mouth daily. 10/02/16   [provider]  zolpidem (AMBIEN) 10 MG tablet Take 10 mg by mouth at bedtime as needed for sleep. 02/07/20   [provider]    Family History Family History  Problem Relation Age of Onset   Healthy Mother    Hypertension Father    Skin cancer Brother        dx before 81   Colon cancer Maternal Aunt 50   Cervical cancer Maternal Aunt        dx before 9; x2   Cancer Paternal Grandmother 53       unknown primary; ? liver   Lung cancer Paternal Grandfather        dx after 66   Breast cancer Cousin 52       maternal cousin   Colon cancer Other        MGM's sister   Gastric cancer Other        PGM's sister   Leukemia Other        PMG's sister   Rectal cancer Neg Hx    Esophageal cancer Neg Hx     Social History Social History   Tobacco Use   Smoking status: Never   Smokeless tobacco: Never  Vaping Use   Vaping status: Never Used  Substance Use Topics   Alcohol use: Not Currently    Comment: pt states 1 glass of wine maybe once a month   Drug use: No      Allergies   Antihistamines, diphenhydramine -type; Diphenhydramine  hcl; Promethazine ; and Pseudoeph-doxylamine-dm-apap   Review of Systems Review of Systems  Constitutional:  Negative for chills and fever.  Eyes:  Positive for photophobia. Negative for visual disturbance.  Respiratory:  Negative for cough and shortness of breath.   Cardiovascular:  Negative for chest pain and palpitations.  Gastrointestinal:  Positive for nausea  and vomiting. Negative for abdominal pain and diarrhea.  Neurological:  Positive for headaches. Negative for dizziness, syncope, facial asymmetry, speech difficulty, weakness, light-headedness and numbness.     Physical Exam Triage Vital Signs ED Triage Vitals  Encounter Vitals Group     BP      Girls Systolic BP Percentile      Girls Diastolic BP Percentile      Boys Systolic BP Percentile      Boys Diastolic BP Percentile      Pulse      Resp      Temp      Temp src      SpO2      Weight      Height      Head Circumference      Peak Flow      Pain Score      Pain Loc      Pain Education      Exclude from Growth Chart    No data found.  Updated Vital Signs BP 127/87   Pulse 86   Temp 98 F (36.7 C)   Resp 18   LMP  (LMP Unknown)   SpO2 97%   Visual Acuity Right Eye Distance:   Left Eye Distance:   Bilateral Distance:    Right Eye Near:   Left Eye Near:    Bilateral Near:     Physical Exam Constitutional:      General: She is not in acute distress.    Appearance: She is ill-appearing.  HENT:     Mouth/Throat:     Mouth: Mucous membranes are moist.  Cardiovascular:     Rate and Rhythm: Normal rate and regular rhythm.     Heart sounds: Normal heart sounds.  Pulmonary:     Effort: Pulmonary effort is normal. No respiratory distress.     Breath sounds: Normal breath sounds.  Abdominal:     General: Bowel sounds are normal.     Palpations: Abdomen is soft.     Tenderness: There is no abdominal tenderness. There is no  guarding or rebound.  Neurological:     General: No focal deficit present.     Mental Status: She is alert and oriented to person, place, and time.     Cranial Nerves: No cranial nerve deficit.     Sensory: No sensory deficit.     Motor: No weakness.     Gait: Gait normal.      UC Treatments / Results  Labs (all labs ordered are listed, but only abnormal results are displayed) Labs Reviewed - No data to display  EKG   Radiology No results found.  Procedures Procedures (including critical care time)  Medications Ordered in UC Medications  ketorolac  (TORADOL ) 30 MG/ML injection 30 mg (30 mg Intramuscular Given 10/14/23 1759)  dexamethasone  (DECADRON ) injection 10 mg (10 mg Intramuscular Given 10/14/23 1800)    Initial Impression / Assessment and Plan / UC Course  I have reviewed the triage vital signs and the nursing notes.  Pertinent labs & imaging results that were available during my care of the patient were reviewed by me and considered in my medical decision making (see chart for details).    Intractable migraine headache.  Afebrile and vital signs are stable.  Patient declines transfer to the ED.  Toradol  and dexamethasone  injections given here.  Patient has Zofran  at home to take if needed for nausea and vomiting.  Instructed her to follow-up with her  PCP or neurologist tomorrow.  Strict ED precautions given.  She agrees with plan of care.  Final Clinical Impressions(s) / UC Diagnoses   Final diagnoses:  Intractable migraine without status migrainosus, unspecified migraine type     Discharge Instructions      Follow up with your primary care provider tomorrow.  Go to the emergency department if you have persistent or worsening symptoms.    You were given an injection of dexamethasone  and Toradol  here today.  Do not take any over-the-counter medications until tomorrow.     ED Prescriptions   None    PDMP not reviewed this encounter.   Corlis Burnard DEL,  NP 10/14/23 [phone]

## 2023-10-14 NOTE — ED Triage Notes (Signed)
 Patient complains of migraine headache  with nausea x 13 days. Patient has taking over the last 13 days Fioricet, Ubrelvy , tizanidine  and almotriptan  and prednisone  dose pack with no improvement. Rates pain 7/10.

## 2023-10-14 NOTE — Discharge Instructions (Addendum)
 Follow up with your primary care provider tomorrow.  Go to the emergency department if you have persistent or worsening symptoms.    You were given an injection of dexamethasone  and Toradol  here today.  Do not take any over-the-counter medications until tomorrow.

## 2023-10-15 ENCOUNTER — Inpatient Hospital Stay: Admission: RE | Admit: 2023-10-15

## 2023-10-19 ENCOUNTER — Telehealth: Admitting: Physician Assistant

## 2023-10-19 DIAGNOSIS — B379 Candidiasis, unspecified: Secondary | ICD-10-CM

## 2023-10-19 DIAGNOSIS — R3989 Other symptoms and signs involving the genitourinary system: Secondary | ICD-10-CM | POA: Diagnosis not present

## 2023-10-19 DIAGNOSIS — T3695XA Adverse effect of unspecified systemic antibiotic, initial encounter: Secondary | ICD-10-CM

## 2023-10-19 MED ORDER — CEPHALEXIN 500 MG PO CAPS: 500.0000 mg | ORAL_CAPSULE | Freq: Two times a day (BID) | ORAL | 0 refills | Status: AC

## 2023-10-19 MED ORDER — FLUCONAZOLE 150 MG PO TABS: 150.0000 mg | ORAL_TABLET | ORAL | 0 refills | Status: AC | PRN

## 2023-10-19 NOTE — Progress Notes (Signed)
 E-Visit for Urinary Problems  We are sorry that you are not feeling well.  Here is how we plan to help!  Based on what you shared with me it looks like you most likely have a simple urinary tract infection.  A UTI (Urinary Tract Infection) is a bacterial infection of the bladder.  Most cases of urinary tract infections are simple to treat but a key part of your care is to encourage you to drink plenty of fluids and watch your symptoms carefully.  I have prescribed Keflex 500 mg twice a day for 7 days.  Your symptoms should gradually improve. Call us if the burning in your urine worsens, you develop worsening fever, back pain or pelvic pain or if your symptoms do not resolve after completing the antibiotic.  Urinary tract infections can be prevented by drinking plenty of water to keep your body hydrated.  Also be sure when you wipe, wipe from front to back and don't hold it in!  If possible, empty your bladder every 4 hours.  Diflucan given as prophylaxis as patient tends to get vaginal yeast infections with antibiotic use.   HOME CARE Drink plenty of fluids Compete the full course of the antibiotics even if the symptoms resolve Remember, when you need to go.go. Holding in your urine can increase the likelihood of getting a UTI! GET HELP RIGHT AWAY IF: You cannot urinate You get a high fever Worsening back pain occurs You see blood in your urine You feel sick to your stomach or throw up You feel like you are going to pass out  MAKE SURE YOU  Understand these instructions. Will watch your condition. Will get help right away if you are not doing well or get worse.   Thank you for choosing an e-visit.  Your e-visit answers were reviewed by a board certified advanced clinical practitioner to complete your personal care plan. Depending upon the condition, your plan could have included both over the counter or prescription medications.  Please review your pharmacy choice. Make sure  the pharmacy is open so you can pick up prescription now. If there is a problem, you may contact your provider through [employer] and have the prescription routed to another pharmacy.  Your safety is important to [place]. If you have drug allergies check your prescription carefully.   For the next 24 hours you can use MyChart to ask questions about today's visit, request a non-urgent call back, or ask for a work or school excuse. You will get an email in the next two days asking about your experience. I hope that your e-visit has been valuable and will speed your recovery.    I have spent 5 minutes in review of e-visit questionnaire, review and updating patient chart, medical decision making and response to patient.   Margaretann Loveless, PA-C

## 2023-11-02 ENCOUNTER — Other Ambulatory Visit (HOSPITAL_COMMUNITY): Payer: Self-pay

## 2023-11-02 ENCOUNTER — Telehealth: Payer: Self-pay

## 2023-11-02 NOTE — Telephone Encounter (Signed)
 Pharmacy Patient Advocate Encounter   Received notification from CoverMyMeds that prior authorization for [employer]  is required/requested.   Insurance verification completed.   The patient is insured through [employer].   Per test claim: PA required; PA submitted to above mentioned insurance via Latent Key/confirmation #/EOC [facility] Status is pending

## 2023-11-03 NOTE — Telephone Encounter (Signed)
 Pharmacy Patient Advocate Encounter  Received notification from [employer] that Prior Authorization for Em[employer]ality  has been APPROVED from 11/02/2023 to 11/01/2024   PA #/Case ID/Reference #: [id number]

## 2023-11-21 ENCOUNTER — Telehealth: Admitting: Emergency Medicine

## 2023-11-21 DIAGNOSIS — R300 Dysuria: Secondary | ICD-10-CM

## 2023-11-21 NOTE — Progress Notes (Signed)
  I am very limited in what I can treat by evisit.   Because you did not improve from prior treatment for UTI, I am not able to help you by evisit. Your condition warrants further evaluation and I recommend that you be seen in a face-to-face visit.   NOTE: There will be NO CHARGE for this E-Visit   If you are having a true medical emergency, please call 911.     For an urgent face to face visit, [facility] has multiple urgent care centers for your convenience.  Click the link below for the full list of locations and hours, walk-in wait times, appointment scheduling options and driving directions:  Urgent Care - [place], [place], [place], [place], [place], [place]  [place]     Your MyChart E-visit questionnaire answers were reviewed by a board certified advanced clinical practitioner to complete your personal care plan based on your specific symptoms.    Thank you for using e-Visits.

## 2023-12-01 ENCOUNTER — Encounter: Payer: Self-pay | Admitting: Neurology

## 2023-12-01 DIAGNOSIS — G43009 Migraine without aura, not intractable, without status migrainosus: Secondary | ICD-10-CM

## 2023-12-07 ENCOUNTER — Telehealth: Payer: Self-pay | Admitting: Neurology

## 2023-12-07 NOTE — Telephone Encounter (Signed)
 Referral for neurology fax to [phone], Fax: [phone]

## 2023-12-07 NOTE — Addendum Note (Signed)
 Addended by: GAYLAND LAURAINE PARAS on: 12/07/2023 09:23 AM   Modules accepted: Orders

## 2023-12-22 ENCOUNTER — Encounter: Payer: Self-pay | Admitting: Neurology

## 2024-01-02 ENCOUNTER — Other Ambulatory Visit: Payer: Self-pay | Admitting: Neurology

## 2024-01-06 NOTE — Telephone Encounter (Signed)
 Last seen on 10/07/23 Follow up scheduled on 04/13/24

## 2024-02-04 ENCOUNTER — Encounter: Payer: Self-pay | Admitting: Neurology

## 2024-02-04 MED ORDER — PROCHLORPERAZINE MALEATE 10 MG PO TABS: 10.0000 mg | ORAL_TABLET | Freq: Once | ORAL | 1 refills | Status: AC

## 2024-04-13 ENCOUNTER — Telehealth: Admitting: Neurology
